# Patient Record
Sex: Female | Born: 1950
Health system: Southern US, Community
[De-identification: ages and names within clinical notes are randomized; demographics above are authoritative.]

## PROBLEM LIST (undated history)

## (undated) DIAGNOSIS — M069 Rheumatoid arthritis, unspecified: Secondary | ICD-10-CM

## (undated) DIAGNOSIS — L900 Lichen sclerosus et atrophicus: Secondary | ICD-10-CM

## (undated) DIAGNOSIS — IMO0002 Reserved for concepts with insufficient information to code with codable children: Secondary | ICD-10-CM

## (undated) DIAGNOSIS — R896 Abnormal cytological findings in specimens from other organs, systems and tissues: Secondary | ICD-10-CM

## (undated) DIAGNOSIS — J189 Pneumonia, unspecified organism: Secondary | ICD-10-CM

## (undated) HISTORY — DX: Abnormal cytological findings in specimens from other organs, systems and tissues: R89.6

## (undated) HISTORY — DX: Rheumatoid arthritis, unspecified: M06.9

## (undated) HISTORY — DX: Reserved for concepts with insufficient information to code with codable children: IMO0002

## (undated) HISTORY — DX: Pneumonia, unspecified organism: J18.9

## (undated) HISTORY — DX: Lichen sclerosus et atrophicus: L90.0

---

## 1995-10-19 HISTORY — PX: TUBAL LIGATION: SHX77

## 1998-06-11 ENCOUNTER — Ambulatory Visit (HOSPITAL_COMMUNITY): Admission: RE | Admit: 1998-06-11 | Discharge: 1998-06-11 | Payer: Self-pay | Admitting: *Deleted

## 1999-06-16 ENCOUNTER — Encounter: Payer: Self-pay | Admitting: Family Medicine

## 1999-06-16 ENCOUNTER — Ambulatory Visit (HOSPITAL_COMMUNITY): Admission: RE | Admit: 1999-06-16 | Discharge: 1999-06-16 | Payer: Self-pay | Admitting: Family Medicine

## 2001-03-21 ENCOUNTER — Encounter: Payer: Self-pay | Admitting: *Deleted

## 2001-03-21 ENCOUNTER — Other Ambulatory Visit: Admission: RE | Admit: 2001-03-21 | Discharge: 2001-03-21 | Payer: Self-pay | Admitting: *Deleted

## 2001-03-21 ENCOUNTER — Ambulatory Visit (HOSPITAL_COMMUNITY): Admission: RE | Admit: 2001-03-21 | Discharge: 2001-03-21 | Payer: Self-pay | Admitting: *Deleted

## 2001-04-04 ENCOUNTER — Other Ambulatory Visit: Admission: RE | Admit: 2001-04-04 | Discharge: 2001-04-04 | Payer: Self-pay | Admitting: *Deleted

## 2001-04-04 ENCOUNTER — Encounter (INDEPENDENT_AMBULATORY_CARE_PROVIDER_SITE_OTHER): Payer: Self-pay | Admitting: Specialist

## 2002-02-22 ENCOUNTER — Emergency Department (HOSPITAL_COMMUNITY): Admission: EM | Admit: 2002-02-22 | Discharge: 2002-02-22 | Payer: Self-pay | Admitting: Emergency Medicine

## 2004-02-27 ENCOUNTER — Other Ambulatory Visit: Admission: RE | Admit: 2004-02-27 | Discharge: 2004-02-27 | Payer: Self-pay | Admitting: Obstetrics and Gynecology

## 2005-07-25 ENCOUNTER — Emergency Department (HOSPITAL_COMMUNITY): Admission: EM | Admit: 2005-07-25 | Discharge: 2005-07-25 | Payer: Self-pay | Admitting: Family Medicine

## 2005-10-15 ENCOUNTER — Ambulatory Visit (HOSPITAL_COMMUNITY): Admission: RE | Admit: 2005-10-15 | Discharge: 2005-10-15 | Payer: Self-pay | Admitting: Obstetrics and Gynecology

## 2005-10-30 ENCOUNTER — Emergency Department (HOSPITAL_COMMUNITY): Admission: EM | Admit: 2005-10-30 | Discharge: 2005-10-30 | Payer: Self-pay | Admitting: Emergency Medicine

## 2006-11-24 ENCOUNTER — Ambulatory Visit (HOSPITAL_COMMUNITY): Admission: RE | Admit: 2006-11-24 | Discharge: 2006-11-24 | Payer: Self-pay | Admitting: Obstetrics and Gynecology

## 2007-06-29 ENCOUNTER — Ambulatory Visit: Payer: Self-pay | Admitting: Internal Medicine

## 2007-06-29 DIAGNOSIS — J309 Allergic rhinitis, unspecified: Secondary | ICD-10-CM | POA: Insufficient documentation

## 2007-06-29 DIAGNOSIS — Z860100 Personal history of colon polyps, unspecified: Secondary | ICD-10-CM | POA: Insufficient documentation

## 2007-07-12 ENCOUNTER — Telehealth: Payer: Self-pay | Admitting: Internal Medicine

## 2008-01-31 ENCOUNTER — Ambulatory Visit: Payer: Self-pay | Admitting: Family Medicine

## 2008-01-31 DIAGNOSIS — F411 Generalized anxiety disorder: Secondary | ICD-10-CM | POA: Insufficient documentation

## 2008-01-31 DIAGNOSIS — L255 Unspecified contact dermatitis due to plants, except food: Secondary | ICD-10-CM | POA: Insufficient documentation

## 2008-08-09 ENCOUNTER — Telehealth: Payer: Self-pay | Admitting: *Deleted

## 2008-12-16 DIAGNOSIS — IMO0001 Reserved for inherently not codable concepts without codable children: Secondary | ICD-10-CM

## 2008-12-16 HISTORY — DX: Reserved for inherently not codable concepts without codable children: IMO0001

## 2010-10-18 DIAGNOSIS — L900 Lichen sclerosus et atrophicus: Secondary | ICD-10-CM

## 2010-10-18 DIAGNOSIS — M069 Rheumatoid arthritis, unspecified: Secondary | ICD-10-CM

## 2010-10-18 HISTORY — DX: Rheumatoid arthritis, unspecified: M06.9

## 2010-10-18 HISTORY — DX: Lichen sclerosus et atrophicus: L90.0

## 2010-11-07 ENCOUNTER — Encounter: Payer: Self-pay | Admitting: Neurology

## 2010-11-08 ENCOUNTER — Encounter: Payer: Self-pay | Admitting: Obstetrics and Gynecology

## 2010-11-09 ENCOUNTER — Encounter: Payer: Self-pay | Admitting: Gastroenterology

## 2011-02-24 ENCOUNTER — Other Ambulatory Visit (HOSPITAL_COMMUNITY)
Admission: RE | Admit: 2011-02-24 | Discharge: 2011-02-24 | Disposition: A | Discharge status: Home / Self Care | Payer: Self-pay | Source: Ambulatory Visit | Attending: Gynecology | Admitting: Gynecology

## 2011-02-24 ENCOUNTER — Ambulatory Visit (INDEPENDENT_AMBULATORY_CARE_PROVIDER_SITE_OTHER): Payer: Self-pay | Admitting: Gynecology

## 2011-02-24 ENCOUNTER — Other Ambulatory Visit: Payer: Self-pay | Admitting: Gynecology

## 2011-02-24 DIAGNOSIS — Z124 Encounter for screening for malignant neoplasm of cervix: Secondary | ICD-10-CM | POA: Insufficient documentation

## 2011-02-24 DIAGNOSIS — Z833 Family history of diabetes mellitus: Secondary | ICD-10-CM

## 2011-02-24 DIAGNOSIS — Z13220 Encounter for screening for lipoid disorders: Secondary | ICD-10-CM

## 2011-02-24 DIAGNOSIS — Z01419 Encounter for gynecological examination (general) (routine) without abnormal findings: Secondary | ICD-10-CM

## 2011-07-15 ENCOUNTER — Other Ambulatory Visit: Payer: Self-pay | Admitting: *Deleted

## 2011-07-15 DIAGNOSIS — E7800 Pure hypercholesterolemia, unspecified: Secondary | ICD-10-CM

## 2011-07-16 ENCOUNTER — Other Ambulatory Visit (INDEPENDENT_AMBULATORY_CARE_PROVIDER_SITE_OTHER): Payer: Self-pay

## 2011-07-16 DIAGNOSIS — E7800 Pure hypercholesterolemia, unspecified: Secondary | ICD-10-CM

## 2011-08-05 ENCOUNTER — Encounter: Payer: Self-pay | Admitting: Gynecology

## 2011-08-19 ENCOUNTER — Ambulatory Visit: Payer: Self-pay | Admitting: Gynecology

## 2011-08-31 ENCOUNTER — Other Ambulatory Visit (HOSPITAL_COMMUNITY)
Admission: RE | Admit: 2011-08-31 | Discharge: 2011-08-31 | Disposition: A | Discharge status: Home / Self Care | Payer: Self-pay | Source: Ambulatory Visit | Attending: Gynecology | Admitting: Gynecology

## 2011-08-31 ENCOUNTER — Ambulatory Visit (INDEPENDENT_AMBULATORY_CARE_PROVIDER_SITE_OTHER): Payer: Self-pay | Admitting: Gynecology

## 2011-08-31 ENCOUNTER — Encounter: Payer: Self-pay | Admitting: Gynecology

## 2011-08-31 VITALS — BP 110/70

## 2011-08-31 DIAGNOSIS — N898 Other specified noninflammatory disorders of vagina: Secondary | ICD-10-CM

## 2011-08-31 DIAGNOSIS — Z124 Encounter for screening for malignant neoplasm of cervix: Secondary | ICD-10-CM

## 2011-08-31 DIAGNOSIS — Z01419 Encounter for gynecological examination (general) (routine) without abnormal findings: Secondary | ICD-10-CM | POA: Insufficient documentation

## 2011-08-31 DIAGNOSIS — L293 Anogenital pruritus, unspecified: Secondary | ICD-10-CM

## 2011-08-31 DIAGNOSIS — R87619 Unspecified abnormal cytological findings in specimens from cervix uteri: Secondary | ICD-10-CM

## 2011-08-31 DIAGNOSIS — B373 Candidiasis of vulva and vagina: Secondary | ICD-10-CM

## 2011-08-31 MED ORDER — FLUCONAZOLE 150 MG PO TABS: 150.0000 mg | ORAL_TABLET | Freq: Once | ORAL | Status: AC

## 2011-08-31 NOTE — Progress Notes (Signed)
Patient presents for follow up Pap smear as well as complaining of some vaginal itching and dryness.  Exam Pelvic external BUS vagina with white discharge KOH wet prep done. Cervix normal Pap done. Uterus normal size midline mobile nontender. Adnexa without masses or tenderness.  Assessment and plan: 1. White discharge KOH wet prep is positive for yeast. We'll treat with Diflucan 150x1 dose follow up if symptoms persist or recur. 2. History of low-grade SIL 2005 and 2006. ASCUS negative high-risk HPV 2010. Last Pap smear May 2012 is normal. Assuming this Pap smear is normal then we'll go to annual cytology and then at some point less frequent screening but we'll discuss that at her annual visit. 3. Rheumatoid arthritis. Patient recently diagnosed with rheumatoid arthritis was treated with a course of steroids which probably contributed to her yeast infection.

## 2011-09-03 ENCOUNTER — Telehealth: Payer: Self-pay | Admitting: *Deleted

## 2011-09-03 MED ORDER — TERCONAZOLE 0.8 % VA CREA: 1.0000 | TOPICAL_CREAM | Freq: Every day | VAGINAL | Status: AC

## 2011-09-03 NOTE — Telephone Encounter (Signed)
Pt informed with the below note. 

## 2011-09-03 NOTE — Telephone Encounter (Signed)
Pt called stating that itching from yeast infection still there. Pt was seen on 08/31/11 and was given diflucan 150 mg 1 tab to take. She took medication and no relief. Please advise.

## 2011-09-03 NOTE — Telephone Encounter (Signed)
Terazol 3 day cream 

## 2011-09-06 ENCOUNTER — Telehealth: Payer: Self-pay | Admitting: *Deleted

## 2011-09-06 MED ORDER — FLUCONAZOLE 200 MG PO TABS: 200.0000 mg | ORAL_TABLET | Freq: Every day | ORAL | Status: AC

## 2011-09-06 NOTE — Telephone Encounter (Signed)
Pt called today c/o itching still from last office visit 08/31/11. Pt has had diflucan 150 mg and Terazol 3 day cream and still has itching. Pt thinks the cream has made the itching worse. Office visit? Please advise

## 2011-09-06 NOTE — Telephone Encounter (Signed)
Recommend Diflucan 200 daily x5 days

## 2011-09-06 NOTE — Telephone Encounter (Signed)
Pt informed with the below note. 

## 2011-09-06 NOTE — Telephone Encounter (Signed)
Addended by: Dara Lords on: 09/06/2011 12:59 PM   Modules accepted: Orders

## 2011-09-15 ENCOUNTER — Encounter: Payer: Self-pay | Admitting: Gynecology

## 2011-09-15 ENCOUNTER — Ambulatory Visit (INDEPENDENT_AMBULATORY_CARE_PROVIDER_SITE_OTHER): Payer: Self-pay | Admitting: Gynecology

## 2011-09-15 DIAGNOSIS — N898 Other specified noninflammatory disorders of vagina: Secondary | ICD-10-CM

## 2011-09-15 DIAGNOSIS — N9089 Other specified noninflammatory disorders of vulva and perineum: Secondary | ICD-10-CM

## 2011-09-15 DIAGNOSIS — L293 Anogenital pruritus, unspecified: Secondary | ICD-10-CM

## 2011-09-15 DIAGNOSIS — B373 Candidiasis of vulva and vagina: Secondary | ICD-10-CM

## 2011-09-15 MED ORDER — FLUCONAZOLE 200 MG PO TABS: ORAL_TABLET | ORAL | Status: DC

## 2011-09-15 NOTE — Patient Instructions (Signed)
Follow up for biopsy results. Take Diflucan as recommended.

## 2011-09-15 NOTE — Progress Notes (Signed)
Patient presents complaining of persistent vaginal pruritus and discharge. When I saw her previously she had yeast and I prescribed Diflucan 150x1 dose. She called stating that it did not help and I gave her Terazol 3 cream. This again did not help and in fact made her symptoms worse with more burning and we called in Diflucan 200 daily x5 days. She said that that seemed to help but it did not completely eradicate her symptoms.  Exam External with some whitish hyperkeratotic changes bilaterally lateral to the posterior fourchette in the folds of her lower labia. Overall atrophic changes noted vagina atrophic changes slight white discharge cervix atrophic bimanual uterus normal size midline mobile nontender adnexa without masses or tenderness  Assessment and plan: 1. White discharge KOH wet prep is positive for yeast. She does have some hyperkeratotic type changes externally lateral to the posterior fourchette. Question lichen sclerosis versus chronic inflammation due to recurrent yeast. Discussed biopsy with the patient and she agrees subtotally area on the left side lateral and inferior to the posterior fourchette was cleansed with Betadine infiltrated with 1% lidocaine and represent a skin biopsy was taken. Silver nitrate hemostasis applied. Patient will follow for results. We'll cover her yeast with Diflucan 200 twice a day x3 days then daily x5 days. I discussed possible Temovate cream if lichen sclerosis. I also reviewed the issues of atrophic changes. She is having some longer term dryness and whether estrogen treatment would be warranted. I reviewed the whole issue of HRT to include the WHI study increased risk of stroke heart attack DVT as well as increased risk of breast cancer. Oral versus topical with absorption issues discussed and the use of Vagifem with limited absorption also reviewed. Patient to follow for biopsy results we'll see how she's doing on the Diflucan and then we'll go from there.

## 2011-09-21 MED ORDER — CLOBETASOL PROPIONATE 0.05 % EX CREA: TOPICAL_CREAM | CUTANEOUS | Status: DC

## 2011-09-21 NOTE — Progress Notes (Signed)
Addended by: Dara Lords on: 09/21/2011 08:47 AM   Modules accepted: Orders

## 2012-08-07 ENCOUNTER — Encounter: Payer: Self-pay | Admitting: Gynecology

## 2012-09-08 ENCOUNTER — Encounter: Payer: Self-pay | Admitting: Gynecology

## 2012-09-08 ENCOUNTER — Other Ambulatory Visit: Payer: Self-pay | Admitting: Gynecology

## 2012-09-08 NOTE — Telephone Encounter (Signed)
Pt called requesting refill on Rx. No vaginal itching nor discharge.

## 2013-08-09 ENCOUNTER — Encounter: Payer: Self-pay | Admitting: Gynecology

## 2014-08-14 ENCOUNTER — Ambulatory Visit: Payer: Self-pay | Admitting: Women's Health

## 2014-08-15 ENCOUNTER — Encounter: Payer: Self-pay | Admitting: Women's Health

## 2014-08-15 ENCOUNTER — Ambulatory Visit (INDEPENDENT_AMBULATORY_CARE_PROVIDER_SITE_OTHER): Payer: Self-pay | Admitting: Women's Health

## 2014-08-15 VITALS — BP 122/80 | Ht 65.0 in | Wt 135.0 lb

## 2014-08-15 DIAGNOSIS — R350 Frequency of micturition: Secondary | ICD-10-CM

## 2014-08-15 DIAGNOSIS — N3000 Acute cystitis without hematuria: Secondary | ICD-10-CM

## 2014-08-15 LAB — URINALYSIS W MICROSCOPIC + REFLEX CULTURE
Bilirubin Urine: NEGATIVE
Casts: NONE SEEN
Crystals: NONE SEEN
Glucose, UA: NEGATIVE mg/dL
Ketones, ur: NEGATIVE mg/dL
Nitrite: POSITIVE — AB
Specific Gravity, Urine: 1.02 (ref 1.005–1.030)
Urobilinogen, UA: 0.2 mg/dL (ref 0.0–1.0)
pH: 5.5 (ref 5.0–8.0)

## 2014-08-15 MED ORDER — FLUCONAZOLE 150 MG PO TABS: 150.0000 mg | ORAL_TABLET | Freq: Once | ORAL | Status: DC

## 2014-08-15 MED ORDER — CIPROFLOXACIN HCL 500 MG PO TABS: 500.0000 mg | ORAL_TABLET | Freq: Two times a day (BID) | ORAL | Status: DC

## 2014-08-15 NOTE — Patient Instructions (Signed)

## 2014-08-15 NOTE — Progress Notes (Signed)
Presents with 2 day history of intermittent urinary frequency, lower abdominal pressure, and "tingling" with urination.  Denies vaginal discharge and odor, fever, or abdominal pain.  Leaving in two days for two week trip to [place].   Exam: Appears well. External genitalia WLN. Vaginal wall and cervix visualized; atrophy noted with no discharge or erythema.  No CMT or adnexal fullness. UA: Positive for Nitrites, moderate leukocytes, TNTC WBC, Many bacteria.   UTI  Plan: Cipro 500mg  PO BID X 5 days. Instructed to take for 3 days, and if sxs not cleared, complete remaining two days. Diflucan 150mg  PO once for yeast if needed with refill.  UTI prevention reviewed. Urine culture pending. Schedule annual exam.

## 2014-08-18 LAB — URINE CULTURE: Colony Count: >=100000

## 2014-08-19 ENCOUNTER — Encounter: Payer: Self-pay | Admitting: Women's Health

## 2014-09-11 ENCOUNTER — Ambulatory Visit (INDEPENDENT_AMBULATORY_CARE_PROVIDER_SITE_OTHER): Payer: Self-pay | Admitting: Gynecology

## 2014-09-11 ENCOUNTER — Other Ambulatory Visit (HOSPITAL_COMMUNITY)
Admission: RE | Admit: 2014-09-11 | Discharge: 2014-09-11 | Disposition: A | Discharge status: Home / Self Care | Payer: Self-pay | Source: Ambulatory Visit | Attending: Gynecology | Admitting: Gynecology

## 2014-09-11 ENCOUNTER — Encounter: Payer: Self-pay | Admitting: Gynecology

## 2014-09-11 VITALS — BP 120/60 | Ht 64.0 in | Wt 136.0 lb

## 2014-09-11 DIAGNOSIS — Z01419 Encounter for gynecological examination (general) (routine) without abnormal findings: Secondary | ICD-10-CM | POA: Insufficient documentation

## 2014-09-11 DIAGNOSIS — Z1151 Encounter for screening for human papillomavirus (HPV): Secondary | ICD-10-CM | POA: Insufficient documentation

## 2014-09-11 DIAGNOSIS — N952 Postmenopausal atrophic vaginitis: Secondary | ICD-10-CM

## 2014-09-11 DIAGNOSIS — L900 Lichen sclerosus et atrophicus: Secondary | ICD-10-CM

## 2014-09-11 NOTE — Addendum Note (Signed)
Addended by: Dayna Barker on: 09/11/2014 02:47 PM   Modules accepted: Orders, SmartSet

## 2014-09-11 NOTE — Patient Instructions (Signed)
You may obtain a copy of any labs that were done today by logging onto MyChart as outlined in the instructions provided with your AVS (after visit summary). The office will not call with normal lab results but certainly if there are any significant abnormalities then we will contact you.   Health Maintenance, Female A healthy lifestyle and preventative care can promote health and wellness.  Maintain regular health, dental, and eye exams.  Eat a healthy diet. Foods like vegetables, fruits, whole grains, low-fat dairy products, and lean protein foods contain the nutrients you need without too many calories. Decrease your intake of foods high in solid fats, added sugars, and salt. Get information about a proper diet from your caregiver, if necessary.  Regular physical exercise is one of the most important things you can do for your health. Most adults should get at least 150 minutes of moderate-intensity exercise (any activity that increases your heart rate and causes you to sweat) each week. In addition, most adults need muscle-strengthening exercises on 2 or more days a week.   Maintain a healthy weight. The body mass index (BMI) is a screening tool to identify possible weight problems. It provides an estimate of body fat based on height and weight. Your caregiver can help determine your BMI, and can help you achieve or maintain a healthy weight. For adults 20 years and older:  A BMI below 18.5 is considered underweight.  A BMI of 18.5 to 24.9 is normal.  A BMI of 25 to 29.9 is considered overweight.  A BMI of 30 and above is considered obese.  Maintain normal blood lipids and cholesterol by exercising and minimizing your intake of saturated fat. Eat a balanced diet with plenty of fruits and vegetables. Blood tests for lipids and cholesterol should begin at age 61 and be repeated every 5 years. If your lipid or cholesterol levels are high, you are over 50, or you are a high risk for heart  disease, you may need your cholesterol levels checked more frequently.Ongoing high lipid and cholesterol levels should be treated with medicines if diet and exercise are not effective.  If you smoke, find out from your caregiver how to quit. If you do not use tobacco, do not start.  Lung cancer screening is recommended for adults aged 33 80 years who are at high risk for developing lung cancer because of a history of smoking. Yearly low-dose computed tomography (CT) is recommended for people who have at least a 30-pack-year history of smoking and are a current smoker or have quit within the past 15 years. A pack year of smoking is smoking an average of 1 pack of cigarettes a day for 1 year (for example: 1 pack a day for 30 years or 2 packs a day for 15 years). Yearly screening should continue until the smoker has stopped smoking for at least 15 years. Yearly screening should also be stopped for people who develop a health problem that would prevent them from having lung cancer treatment.  If you are pregnant, do not drink alcohol. If you are breastfeeding, be very cautious about drinking alcohol. If you are not pregnant and choose to drink alcohol, do not exceed 1 drink per day. One drink is considered to be 12 ounces (355 mL) of beer, 5 ounces (148 mL) of wine, or 1.5 ounces (44 mL) of liquor.  Avoid use of street drugs. Do not share needles with anyone. Ask for help if you need support or instructions about stopping  the use of drugs.  High blood pressure causes heart disease and increases the risk of stroke. Blood pressure should be checked at least every 1 to 2 years. Ongoing high blood pressure should be treated with medicines, if weight loss and exercise are not effective.  If you are 59 to 62 years old, ask your caregiver if you should take aspirin to prevent strokes.  Diabetes screening involves taking a blood sample to check your fasting blood sugar level. This should be done once every 3  years, after age 91, if you are within normal weight and without risk factors for diabetes. Testing should be considered at a younger age or be carried out more frequently if you are overweight and have at least 1 risk factor for diabetes.  Breast cancer screening is essential preventative care for women. You should practice "breast self-awareness." This means understanding the normal appearance and feel of your breasts and may include breast self-examination. Any changes detected, no matter how small, should be reported to a caregiver. Women in their 66s and 30s should have a clinical breast exam (CBE) by a caregiver as part of a regular health exam every 1 to 3 years. After age 101, women should have a CBE every year. Starting at age 100, women should consider having a mammogram (breast X-ray) every year. Women who have a family history of breast cancer should talk to their caregiver about genetic screening. Women at a high risk of breast cancer should talk to their caregiver about having an MRI and a mammogram every year.  Breast cancer gene (BRCA)-related cancer risk assessment is recommended for women who have family members with BRCA-related cancers. BRCA-related cancers include breast, ovarian, tubal, and peritoneal cancers. Having family members with these cancers may be associated with an increased risk for harmful changes (mutations) in the breast cancer genes BRCA1 and BRCA2. Results of the assessment will determine the need for genetic counseling and BRCA1 and BRCA2 testing.  The Pap test is a screening test for cervical cancer. Women should have a Pap test starting at age 57. Between ages 25 and 35, Pap tests should be repeated every 2 years. Beginning at age 37, you should have a Pap test every 3 years as long as the past 3 Pap tests have been normal. If you had a hysterectomy for a problem that was not cancer or a condition that could lead to cancer, then you no longer need Pap tests. If you are  between ages 50 and 76, and you have had normal Pap tests going back 10 years, you no longer need Pap tests. If you have had past treatment for cervical cancer or a condition that could lead to cancer, you need Pap tests and screening for cancer for at least 20 years after your treatment. If Pap tests have been discontinued, risk factors (such as a new sexual partner) need to be reassessed to determine if screening should be resumed. Some women have medical problems that increase the chance of getting cervical cancer. In these cases, your caregiver may recommend more frequent screening and Pap tests.  The human papillomavirus (HPV) test is an additional test that may be used for cervical cancer screening. The HPV test looks for the virus that can cause the cell changes on the cervix. The cells collected during the Pap test can be tested for HPV. The HPV test could be used to screen women aged 44 years and older, and should be used in women of any age  who have unclear Pap test results. After the age of 55, women should have HPV testing at the same frequency as a Pap test.  Colorectal cancer can be detected and often prevented. Most routine colorectal cancer screening begins at the age of 44 and continues through age 20. However, your caregiver may recommend screening at an earlier age if you have risk factors for colon cancer. On a yearly basis, your caregiver may provide home test kits to check for hidden blood in the stool. Use of a small camera at the end of a tube, to directly examine the colon (sigmoidoscopy or colonoscopy), can detect the earliest forms of colorectal cancer. Talk to your caregiver about this at age 86, when routine screening begins. Direct examination of the colon should be repeated every 5 to 10 years through age 13, unless early forms of pre-cancerous polyps or small growths are found.  Hepatitis C blood testing is recommended for all people born from [birth year] through 1964 and any  individual with known risks for hepatitis C.  Practice safe sex. Use condoms and avoid high-risk sexual practices to reduce the spread of sexually transmitted infections (STIs). Sexually active women aged 36 and younger should be checked for Chlamydia, which is a common sexually transmitted infection. Older women with new or multiple partners should also be tested for Chlamydia. Testing for other STIs is recommended if you are sexually active and at increased risk.  Osteoporosis is a disease in which the bones lose minerals and strength with aging. This can result in serious bone fractures. The risk of osteoporosis can be identified using a bone density scan. Women ages 20 and over and women at risk for fractures or osteoporosis should discuss screening with their caregivers. Ask your caregiver whether you should be taking a calcium supplement or vitamin D to reduce the rate of osteoporosis.  Menopause can be associated with physical symptoms and risks. Hormone replacement therapy is available to decrease symptoms and risks. You should talk to your caregiver about whether hormone replacement therapy is right for you.  Use sunscreen. Apply sunscreen liberally and repeatedly throughout the day. You should seek shade when your shadow is shorter than you. Protect yourself by wearing long sleeves, pants, a wide-brimmed hat, and sunglasses year round, whenever you are outdoors.  Notify your caregiver of new moles or changes in moles, especially if there is a change in shape or color. Also notify your caregiver if a mole is larger than the size of a pencil eraser.  Stay current with your immunizations. Document Released: 04/19/2011 Document Revised: 01/29/2013 Document Reviewed: 04/19/2011 [facility] Patient Information 2014 [employer].

## 2014-09-11 NOTE — Progress Notes (Signed)
Heather Koch January 29, 1951 [id number]        63 y.o.  G3P3 for annual exam.  Has not been in for several years. Several issues noted below.  Past medical history,surgical history, problem list, medications, allergies, family history and social history were all reviewed and documented as reviewed in the EPIC chart.  ROS:  12 system ROS performed with pertinent positives and negatives included in the history, assessment and plan.   Additional significant findings :  none   Exam: [facility] person Vitals:   09/11/14 1424  BP: 120/60  Height: 5\' 4"  (1.626 m)  Weight: 136 lb (61.689 kg)   General appearance:  Normal affect, orientation and appearance. Skin: Grossly normal HEENT: Without gross lesions.  No cervical or supraclavicular adenopathy. Thyroid normal.  Lungs:  Clear without wheezing, rales or rhonchi Cardiac: RR, without RMG Abdominal:  Soft, nontender, without masses, guarding, rebound, organomegaly or hernia Breasts:  Examined lying and sitting without masses, retractions, discharge or axillary adenopathy. Pelvic:  Ext/BUS/vagina with generalized atrophic changes  Cervix with atrophic changes. Pap/HPV  Uterus anteverted, normal size, shape and contour, midline and mobile nontender   Adnexa  Without masses or tenderness    Anus and perineum  Normal   Rectovaginal  Normal sphincter tone without palpated masses or tenderness.    Assessment/Plan:  63 y.o. G3P3 female for annual exam.   1. Postmenopausal/atrophic genital changes. Patient without significant symptoms of hot flashes, night sweats, vaginal dryness or any vaginal bleeding. Continue to monitor. Report any vaginal bleeding. 2. History of lichen sclerosis. Exam shows atrophic changes but no classic white blanched out appearance. No lesions noted on vulvar exam. No symptoms such as itching or irritation.  Continue to observe. 3. Pap smear 2012. Pap/HPV today. Patient does have history of LGSIL in 2005/2006. ASCUS with  negative high-risk HPV 2010. Pap smear 2012 normal. Triage based upon this Pap smears result. 4. Mammography 07/2014. Continue with annual mammography. SBE monthly reviewed. 5. Colonoscopy 6 years ago. Repeat at their recommended interval. 6. DEXA reported 2012 by rheumatologist following her for rheumatoid arthritis. Patient was told it was normal. Recommend repeat at age 33 unless recommended sooner by the rheumatologist. Increased calcium vitamin D. 7. Health maintenance. No lab work done as she reports this done through her primary physician's office.     76 MD, 2:41 PM 09/11/2014

## 2014-09-16 ENCOUNTER — Encounter: Payer: Self-pay | Admitting: Gynecology

## 2014-09-17 LAB — CYTOLOGY - PAP

## 2014-09-22 ENCOUNTER — Other Ambulatory Visit: Payer: Self-pay

## 2014-09-22 ENCOUNTER — Encounter (HOSPITAL_COMMUNITY): Payer: Self-pay | Admitting: *Deleted

## 2014-09-22 ENCOUNTER — Emergency Department (HOSPITAL_COMMUNITY)
Admission: EM | Admit: 2014-09-22 | Discharge: 2014-09-22 | Disposition: A | Discharge status: Home / Self Care | Payer: Self-pay | Attending: Emergency Medicine | Admitting: Emergency Medicine

## 2014-09-22 DIAGNOSIS — M069 Rheumatoid arthritis, unspecified: Secondary | ICD-10-CM | POA: Insufficient documentation

## 2014-09-22 DIAGNOSIS — Z79899 Other long term (current) drug therapy: Secondary | ICD-10-CM | POA: Insufficient documentation

## 2014-09-22 DIAGNOSIS — R1013 Epigastric pain: Secondary | ICD-10-CM

## 2014-09-22 DIAGNOSIS — R112 Nausea with vomiting, unspecified: Secondary | ICD-10-CM

## 2014-09-22 LAB — CBC WITH DIFFERENTIAL/PLATELET
Basophils Absolute: 0 10*3/uL (ref 0.0–0.1)
Basophils Relative: 0 % (ref 0–1)
Eosinophils Absolute: 0 10*3/uL (ref 0.0–0.7)
Eosinophils Relative: 0 % (ref 0–5)
HCT: 37.3 % (ref 36.0–46.0)
Hemoglobin: 12.2 g/dL (ref 12.0–15.0)
Lymphocytes Relative: 5 % — ABNORMAL LOW (ref 12–46)
Lymphs Abs: 0.7 10*3/uL (ref 0.7–4.0)
MCH: 32.4 pg (ref 26.0–34.0)
MCHC: 32.7 g/dL (ref 30.0–36.0)
MCV: 99.2 fL (ref 78.0–100.0)
Monocytes Absolute: 0.8 10*3/uL (ref 0.1–1.0)
Monocytes Relative: 6 % (ref 3–12)
Neutro Abs: 11.9 10*3/uL — ABNORMAL HIGH (ref 1.7–7.7)
Neutrophils Relative %: 89 % — ABNORMAL HIGH (ref 43–77)
Platelets: 199 10*3/uL (ref 150–400)
RBC: 3.76 MIL/uL — ABNORMAL LOW (ref 3.87–5.11)
RDW: 13.9 % (ref 11.5–15.5)
WBC: 13.4 10*3/uL — ABNORMAL HIGH (ref 4.0–10.5)

## 2014-09-22 LAB — COMPREHENSIVE METABOLIC PANEL
ALT: 16 U/L (ref 0–35)
AST: 23 U/L (ref 0–37)
Albumin: 4 g/dL (ref 3.5–5.2)
Alkaline Phosphatase: 75 U/L (ref 39–117)
Anion gap: 14 (ref 5–15)
BUN: 15 mg/dL (ref 6–23)
CO2: 25 mEq/L (ref 19–32)
Calcium: 9.7 mg/dL (ref 8.4–10.5)
Chloride: 102 mEq/L (ref 96–112)
Creatinine, Ser: 0.56 mg/dL (ref 0.50–1.10)
GFR calc Af Amer: >90 mL/min (ref >90)
GFR calc non Af Amer: >90 mL/min (ref >90)
Glucose, Bld: 137 mg/dL — ABNORMAL HIGH (ref 70–99)
Potassium: 3.8 mEq/L (ref 3.7–5.3)
Sodium: 141 mEq/L (ref 137–147)
Total Bilirubin: 0.3 mg/dL (ref 0.3–1.2)
Total Protein: 7.4 g/dL (ref 6.0–8.3)

## 2014-09-22 LAB — I-STAT CG4 LACTIC ACID, ED: Lactic Acid, Venous: 1.08 mmol/L (ref 0.5–2.2)

## 2014-09-22 LAB — I-STAT TROPONIN, ED: Troponin i, poc: 0 ng/mL (ref 0.00–0.08)

## 2014-09-22 LAB — LIPASE, BLOOD: Lipase: 51 U/L (ref 11–59)

## 2014-09-22 MED ORDER — ONDANSETRON HCL 4 MG/2ML IJ SOLN
4.0000 mg | Freq: Once | INTRAMUSCULAR | Status: AC
  Administered 2014-09-22: 4 mg via INTRAVENOUS
  Filled 2014-09-22: qty 2

## 2014-09-22 MED ORDER — ONDANSETRON 8 MG PO TBDP: 8.0000 mg | ORAL_TABLET | Freq: Three times a day (TID) | ORAL | Status: DC | PRN

## 2014-09-22 NOTE — ED Notes (Signed)
The patient is unable to give an urine specimen at this time. The tech has reported to the RN in charge. 

## 2014-09-22 NOTE — ED Provider Notes (Signed)
CSN: [id number]     Arrival date & time 09/22/14  0737 History   First MD Initiated Contact with Patient 09/22/14 [phone]     Chief Complaint  Patient presents with  . Emesis  . Nausea  . Abdominal Pain     Patient is a 63 y.o. female presenting with vomiting and abdominal pain. The history is provided by the patient and a significant other.  Emesis Severity:  Moderate Timing:  Intermittent Progression:  Worsening Chronicity:  New Relieved by:  None tried Worsened by:  Nothing tried Associated symptoms: abdominal pain   Associated symptoms: no diarrhea   Risk factors: suspect food intake   Risk factors: no sick contacts and no travel to endemic areas   Abdominal Pain Pain location:  Epigastric Pain quality: aching   Pain radiates to:  Does not radiate Pain severity:  Mild Onset quality:  Sudden Timing:  Constant Associated symptoms: vomiting   Associated symptoms: no chest pain, no diarrhea, no fever and no shortness of breath   Patient reports she woke up with significant nausea and has had nonbloody vomiting No diarrhea She reports some epigastric abd pain No CP/SOB She has never had this before She had otherwise been well.  She reports she ate pizza for dinner and thinks this may have caused her symptoms.  Past Medical History  Diagnosis Date  . Rheumatoid arthritis(714.0) 2012  . LGSIL (low grade squamous intraepithelial dysplasia) 2005/2006  . ASCUS (atypical squamous cells of undetermined significance) on Pap smear 12/2008    neg HR HPV  . Lichen sclerosus 2012   Past Surgical History  Procedure Laterality Date  . Tubal ligation  1997   Family History  Problem Relation Age of Onset  . Hypertension Mother   . Diabetes Mother   . Cancer Father     liver  . Heart disease Sister   . Heart disease Brother   . Cancer Brother     pancreatic cancer  . Cancer Maternal Aunt     throat  . Heart disease Sister   . Heart disease Sister   . Cancer Maternal Uncle      Colon   History  Substance Use Topics  . Smoking status: Never Smoker   . Smokeless tobacco: Never Used  . Alcohol Use: No   OB History    Gravida Para Term Preterm AB TAB SAB Ectopic Multiple Living   3 3        3      Review of Systems  Constitutional: Negative for fever.  Respiratory: Negative for shortness of breath.   Cardiovascular: Negative for chest pain.  Gastrointestinal: Positive for vomiting and abdominal pain. Negative for diarrhea.  All other systems reviewed and are negative.     Allergies  Review of patient's allergies indicates no known allergies.  Home Medications   Prior to Admission medications   Medication Sig Start Date End Date Taking? Authorizing Provider  ALPRAZolam ) 1 MG tablet Take 1 mg by mouth at bedtime as needed.      Historical Provider, MD  clobetasol cream (TEMOVATE) 0.05 % APPLY TOPICALLY AT BEDTIME AS NEEDED FOR ITCHING 09/08/12   09/10/12, MD  FOLIC ACID PO Take 1 tablet by mouth daily.    Historical Provider, MD  Methotrexate Sodium, PF, 250 MG/10ML SOLN Inject as directed once a week.    Historical Provider, MD  sulfaDIAZINE 500 MG tablet Take 1,000 mg by mouth 2 (two) times daily.  Historical Provider, MD   BP 122/61 mmHg  Pulse 88  Temp(Src) 97.4 F (36.3 C) (Oral)  Resp 20  Ht 5\' 4"  (1.626 m)  Wt 130 lb (58.968 kg)  BMI 22.30 kg/m2  SpO2 100%  LMP 03/31/2003 Physical Exam CONSTITUTIONAL: Well developed/well nourished HEAD: Normocephalic/atraumatic EYES: EOMI/PERRL, no icterus ENMT: Mucous membranes moist NECK: supple no meningeal signs SPINE/BACK:entire spine nontender CV: S1/S2 noted, no murmurs/rubs/gallops noted LUNGS: Lungs are clear to auscultation bilaterally, no apparent distress ABDOMEN: soft, mild epigastric tenderness, no rebound or guarding, bowel sounds noted throughout abdomen GU:no cva tenderness NEURO: Pt is awake/alert/appropriate, moves all extremitiesx4.  No facial droop.    EXTREMITIES: pulses normal/equal, full ROM SKIN: warm, color normal PSYCH: no abnormalities of mood noted, alert and oriented to situation  ED Course  Procedures   6:16 AM Pt now feeling improved and requesting PO fluids She does not want to provide a urine sample  7:15 AM Pt feels improved She is taking PO She denies any other pain at this time She denied any CP - I doubt occult ACS at this time However given epigastric pain initially and vomiting, possible biliary colic or other acute abdominal emergency.  I discussed ordering 04/02/2003, however patient/family decided against.  I told them I can not rule out all acute abdominal emergencies with labs, however they elect to go home and f/u as outpatient We discussed strict ER return precautions BP 119/66 mmHg  Pulse 71  Temp(Src) 98.2 F (36.8 C) (Oral)  Resp 18  Ht 5\' 4"  (1.626 m)  Wt 130 lb (58.968 kg)  BMI 22.30 kg/m2  SpO2 98%  LMP 03/31/2003  Labs Review Labs Reviewed  COMPREHENSIVE METABOLIC PANEL - Abnormal; Notable for the following:    Glucose, Bld 137 (*)    All other components within normal limits  CBC WITH DIFFERENTIAL - Abnormal; Notable for the following:    WBC 13.4 (*)    RBC 3.76 (*)    Neutrophils Relative % 89 (*)    Neutro Abs 11.9 (*)    Lymphocytes Relative 5 (*)    All other components within normal limits  LIPASE, BLOOD  I-STAT CG4 LACTIC ACID, ED  I-STAT TROPOININ, ED     EKG  Date: 09/22/2014 0451am  Rate: 89  Rhythm: normal sinus rhythm  QRS Axis: right  Intervals: normal  ST/T Wave abnormalities: nonspecific ST changes  Conduction Disutrbances:none  Narrative Interpretation: significant artifact   Repeat EKG  Date: 09/22/2014 0546am  Rate: 73  Rhythm: normal sinus rhythm  QRS Axis: normal  Intervals: normal  ST/T Wave abnormalities: nonspecific ST changes  Conduction Disutrbances:none  Narrative Interpretation: no ischemic changes on this EKG.  Difficult to compare to prior due  to significant artifact on initial EKG  Medications  ondansetron (ZOFRAN) injection 4 mg (4 mg Intravenous Given 09/22/14 0517)     MDM   Final diagnoses:  Non-intractable vomiting with nausea, vomiting of unspecified type  Epigastric abdominal pain    Nursing notes including past medical history and social history reviewed and considered in documentation Labs/vital reviewed myself and considered during evaluation     14/03/2014, MD 09/22/14 [phone]

## 2014-09-22 NOTE — ED Notes (Signed)
Patient woke up with n/v at 0100.  She reports emesis every 15 min.  She denies any diarrhea.  No one else is sick at home.  Patient states she has a burning pain in her abdomen.  No hx of same.  Patient is weak.

## 2014-09-22 NOTE — Discharge Instructions (Signed)

## 2015-08-13 ENCOUNTER — Encounter: Payer: Self-pay | Admitting: Gynecology

## 2016-08-03 ENCOUNTER — Encounter: Payer: Self-pay | Admitting: Rheumatology

## 2016-08-09 ENCOUNTER — Telehealth: Payer: Self-pay | Admitting: Rheumatology

## 2016-08-09 NOTE — Telephone Encounter (Signed)
Patient had labs done last Wednesday at [facility], calling for results.

## 2016-08-09 NOTE — Telephone Encounter (Signed)
Advised patient labs WNL Vitamin D is 57 / she should continue to use Vit D 3 2000 units day CBC CMP are in [facility]/ WNL

## 2016-08-11 ENCOUNTER — Encounter: Payer: Self-pay | Admitting: Gynecology

## 2016-08-17 ENCOUNTER — Telehealth: Payer: Self-pay | Admitting: Radiology

## 2016-08-17 NOTE — Telephone Encounter (Signed)
Called to advise [facility] placard applic. ready for p/u

## 2016-09-17 ENCOUNTER — Other Ambulatory Visit: Payer: Self-pay | Admitting: *Deleted

## 2016-09-17 ENCOUNTER — Other Ambulatory Visit: Payer: Self-pay | Admitting: Rheumatology

## 2016-09-17 NOTE — Telephone Encounter (Signed)
Left a message for patient that we received a refill request and she is due for labs.

## 2016-09-20 MED ORDER — FOLIC ACID 1 MG PO TABS: 2.0000 mg | ORAL_TABLET | Freq: Every day | ORAL | 3 refills | Status: DC

## 2016-09-20 MED ORDER — METHOTREXATE SODIUM CHEMO INJECTION 50 MG/2ML: 20.0000 mg | INTRAMUSCULAR | 0 refills | Status: DC

## 2016-09-20 NOTE — Telephone Encounter (Signed)
Last Visit: 05/31/16 Next Visit: 11/08/16 Labs: 08/03/16 WNL  Okay to refill MTX and Folic acid?

## 2016-09-20 NOTE — Addendum Note (Signed)
Addended by: Henriette Combs on: 09/20/2016 11:30 AM   Modules accepted: Orders

## 2016-09-20 NOTE — Telephone Encounter (Addendum)
Patient returned Andrea's call; she had her labs done on 10/17 at [facility] on [address]. Please send rx for MTX and folic acid

## 2016-10-20 ENCOUNTER — Other Ambulatory Visit: Payer: Self-pay | Admitting: Rheumatology

## 2016-10-20 MED ORDER — "TUBERCULIN-ALLERGY SYRINGES 27G X 1/2"" 1 ML KIT": PACK | 0 refills | Status: DC

## 2016-10-20 NOTE — Telephone Encounter (Signed)
ok 

## 2016-10-20 NOTE — Telephone Encounter (Signed)
Last Visit: 05/31/16 Next Visit: 11/08/16 Labs: 08/03/16 WNL  Okay to refill Syringes?

## 2016-10-20 NOTE — Telephone Encounter (Signed)
Patient needs a refill of syringes sent to [facility] on [address].

## 2016-11-08 ENCOUNTER — Ambulatory Visit (INDEPENDENT_AMBULATORY_CARE_PROVIDER_SITE_OTHER): Payer: Self-pay | Admitting: Rheumatology

## 2016-11-08 ENCOUNTER — Ambulatory Visit: Payer: Self-pay | Admitting: Rheumatology

## 2016-11-08 ENCOUNTER — Encounter: Payer: Self-pay | Admitting: Rheumatology

## 2016-11-08 VITALS — BP 139/74 | HR 72 | Resp 14 | Ht 64.0 in | Wt 150.0 lb

## 2016-11-08 DIAGNOSIS — M0579 Rheumatoid arthritis with rheumatoid factor of multiple sites without organ or systems involvement: Secondary | ICD-10-CM

## 2016-11-08 DIAGNOSIS — M722 Plantar fascial fibromatosis: Secondary | ICD-10-CM

## 2016-11-08 DIAGNOSIS — M810 Age-related osteoporosis without current pathological fracture: Secondary | ICD-10-CM

## 2016-11-08 DIAGNOSIS — Z79899 Other long term (current) drug therapy: Secondary | ICD-10-CM

## 2016-11-08 DIAGNOSIS — M19042 Primary osteoarthritis, left hand: Secondary | ICD-10-CM

## 2016-11-08 DIAGNOSIS — M19041 Primary osteoarthritis, right hand: Secondary | ICD-10-CM

## 2016-11-08 NOTE — Progress Notes (Signed)
Office Visit Note  Patient: Heather Koch             Date of Birth: 10/05/1951           MRN: [id number]             PCP: Myriam Jacobson, MD Referring: Lorene Dy, MD Visit Date: 11/08/2016 Occupation: @GUAROCC @    Subjective:  No chief complaint on file. Follow-up on rheumatoid arthritis  History of Present Illness: Heather Koch is a 66 y.o. female  Last seen 05/31/2016 Patient states that she is doing well with her rheumatoid arthritis. She's taking methotrexate 0.8 ML's per week, folic acid 2 mg daily, sulfasalazine 2 pills twice a day.  She states that she wants to consider using the pills in the future but right now she'll stay with the injectable methotrexate.  She has a history of osteoporosis with a T score of -2.5 on December 2017 labs. We offered her Fosamax and were prescription for it but she declined to take it because she was having "dental work done". She is afraid of the side effects of the medication and her bones becoming more brittle area She states "I'm not a medicine person" and wants to avoid medication when possible. She also states that she spoke with her dentist who suggested that she might benefit from getting injectable treatment for osteoporosis rather than the pills. I informed the patient that we have to fail Fosamax before we can move on to the other treatment options.   Activities of Daily Living:  Patient reports morning stiffness for 15 minute.   Patient Denies nocturnal pain.  Difficulty dressing/grooming: Denies Difficulty climbing stairs: Denies Difficulty getting out of chair: Denies Difficulty using hands for taps, buttons, cutlery, and/or writing: Denies   Review of Systems  Constitutional: Negative for fatigue.  HENT: Negative for mouth sores and mouth dryness.   Eyes: Negative for dryness.  Respiratory: Negative for shortness of breath.   Gastrointestinal: Negative for constipation and diarrhea.  Musculoskeletal:  Negative for myalgias and myalgias.  Skin: Negative for sensitivity to sunlight.  Psychiatric/Behavioral: Negative for decreased concentration and sleep disturbance.    PMFS History:  Patient Active Problem List   Diagnosis Date Noted  . ANXIETY 01/31/2008  . CONTACT DERMATITIS&OTHER ECZEMA DUE TO PLANTS 01/31/2008  . ALLERGIC RHINITIS 06/29/2007  . COLONIC POLYPS, HX OF 06/29/2007    Past Medical History:  Diagnosis Date  . ASCUS (atypical squamous cells of undetermined significance) on Pap smear 12/2008   neg HR HPV  . LGSIL (low grade squamous intraepithelial dysplasia) 2005/2006  . Lichen sclerosus 4270  . Rheumatoid arthritis(714.0) 2012    Family History  Problem Relation Age of Onset  . Hypertension Mother   . Diabetes Mother   . Cancer Father     liver  . Heart disease Sister   . Heart disease Brother   . Cancer Brother     pancreatic cancer  . Cancer Maternal Aunt     throat  . Heart disease Sister   . Heart disease Sister   . Cancer Maternal Uncle     Colon   Past Surgical History:  Procedure Laterality Date  . TUBAL LIGATION  1997   Social History   Social History Narrative  . No narrative on file     Objective: Vital Signs: BP 139/74 (BP Location: Right Arm, Patient Position: Sitting)   Pulse 72   Resp 14   Ht 5' 4"  (1.626 m)  Wt 150 lb (68 kg)   LMP 03/31/2003   BMI 25.75 kg/m    Physical Exam  Constitutional: She is oriented to person, place, and time. She appears well-developed and well-nourished.  HENT:  Head: Normocephalic and atraumatic.  Eyes: EOM are normal. Pupils are equal, round, and reactive to light.  Cardiovascular: Normal rate, regular rhythm and normal heart sounds.  Exam reveals no gallop and no friction rub.   No murmur heard. Pulmonary/Chest: Effort normal and breath sounds normal. She has no wheezes. She has no rales.  Abdominal: Soft. Bowel sounds are normal. She exhibits no distension. There is no tenderness. There  is no guarding. No hernia.  Musculoskeletal: Normal range of motion. She exhibits no edema, tenderness or deformity.  Lymphadenopathy:    She has no cervical adenopathy.  Neurological: She is alert and oriented to person, place, and time. Coordination normal.  Skin: Skin is warm and dry. Capillary refill takes less than 2 seconds. No rash noted.  Psychiatric: She has a normal mood and affect. Her behavior is normal.     Musculoskeletal Exam:  Full range of motion of all joints  CDAI Exam: CDAI Homunculus Exam:   Joint Counts:  CDAI Tender Joint count: 0 CDAI Swollen Joint count: 0  Global Assessments:  Patient Global Assessment: 0 Provider Global Assessment: 0  No synovitis on examination  Investigation: No additional findings.  No visits with results within 6 Month(s) from this visit.  Latest known visit with results is:  Admission on 09/22/2014, Discharged on 09/22/2014  Component Date Value Ref Range Status  . Sodium 09/22/2014 141  137 - 147 mEq/L Final  . Potassium 09/22/2014 3.8  3.7 - 5.3 mEq/L Final  . Chloride 09/22/2014 102  96 - 112 mEq/L Final  . CO2 09/22/2014 25  19 - 32 mEq/L Final  . Glucose, Bld 09/22/2014 137* 70 - 99 mg/dL Final  . BUN 09/22/2014 15  6 - 23 mg/dL Final  . Creatinine, Ser 09/22/2014 0.56  0.50 - 1.10 mg/dL Final  . Calcium 09/22/2014 9.7  8.4 - 10.5 mg/dL Final  . Total Protein 09/22/2014 7.4  6.0 - 8.3 g/dL Final  . Albumin 09/22/2014 4.0  3.5 - 5.2 g/dL Final  . AST 09/22/2014 23  0 - 37 U/L Final  . ALT 09/22/2014 16  0 - 35 U/L Final  . Alkaline Phosphatase 09/22/2014 75  39 - 117 U/L Final  . Total Bilirubin 09/22/2014 0.3  0.3 - 1.2 mg/dL Final  . GFR calc non Af Amer 09/22/2014 >90  >90 mL/min Final  . GFR calc Af Amer 09/22/2014 >90  >90 mL/min Final   Comment: (NOTE) The eGFR has been calculated using the CKD EPI equation. This calculation has not been validated in all clinical situations. eGFR's persistently <90 mL/min  signify possible Chronic Kidney Disease.   . Anion gap 09/22/2014 14  5 - 15 Final  . WBC 09/22/2014 13.4* 4.0 - 10.5 K/uL Final  . RBC 09/22/2014 3.76* 3.87 - 5.11 MIL/uL Final  . Hemoglobin 09/22/2014 12.2  12.0 - 15.0 g/dL Final  . HCT 09/22/2014 37.3  36.0 - 46.0 % Final  . MCV 09/22/2014 99.2  78.0 - 100.0 fL Final  . MCH 09/22/2014 32.4  26.0 - 34.0 pg Final  . MCHC 09/22/2014 32.7  30.0 - 36.0 g/dL Final  . RDW 09/22/2014 13.9  11.5 - 15.5 % Final  . Platelets 09/22/2014 199  150 - 400 K/uL Final  . Neutrophils  Relative % 09/22/2014 89* 43 - 77 % Final  . Neutro Abs 09/22/2014 11.9* 1.7 - 7.7 K/uL Final  . Lymphocytes Relative 09/22/2014 5* 12 - 46 % Final  . Lymphs Abs 09/22/2014 0.7  0.7 - 4.0 K/uL Final  . Monocytes Relative 09/22/2014 6  3 - 12 % Final  . Monocytes Absolute 09/22/2014 0.8  0.1 - 1.0 K/uL Final  . Eosinophils Relative 09/22/2014 0  0 - 5 % Final  . Eosinophils Absolute 09/22/2014 0.0  0.0 - 0.7 K/uL Final  . Basophils Relative 09/22/2014 0  0 - 1 % Final  . Basophils Absolute 09/22/2014 0.0  0.0 - 0.1 K/uL Final  . Lipase 09/22/2014 51  11 - 59 U/L Final  . Lactic Acid, Venous 09/22/2014 1.08  0.5 - 2.2 mmol/L Final  . Troponin i, poc 09/22/2014 0.00  0.00 - 0.08 ng/mL Final  . Comment 3 09/22/2014          Final   Comment: Due to the release kinetics of cTnI, a negative result within the first hours of the onset of symptoms does not rule out myocardial infarction with certainty. If myocardial infarction is still suspected, repeat the test at appropriate intervals.    These labs are from December 2015. I advised the patient to get labs done today while she is an office. She declined. Her last labs are from 08/04/2016 at [facility]. CBC with differential and CMP with GFR normal. She states that she wants to be fasting and will return to clinic in a week for updated labs  Imaging: No results found.  Speciality Comments: No specialty comments  available.    Procedures:  No procedures performed Allergies: Patient has no known allergies.   Assessment / Plan:     Visit Diagnoses: Rheumatoid arthritis with rheumatoid factor of multiple sites without organ or systems involvement ([place])  High risk medications (not anticoagulants) long-term use - mtx 9.1TA/VW; folic 2/d; ssz 2 bid; adequate response - Plan: CBC with Differential/Platelet, Comprehensive metabolic panel  Osteoporosis without current pathological fracture, unspecified osteoporosis type - feb 2017 = t = -2.5 = fosamax q week  Primary osteoarthritis of both hands  Plantar fasciitis, bilateral    Plan: #1: Rheumatoid arthritis. Well-controlled with methotrexate and sulfasalazine. Patient does not want injectable but does not want to switch from injectable 2 pills yet. She will consider it and let us know. I offered her to switch her to the pills as soon as she is ready and she states that she's not ready yet but she was just wanted to think about it in the water options were.  #2: Adequate response with therapy with methotrexate and sulfasalazine.  #3: History of osteoporosis with a T score of -2.5 on DEXA done on December 2017. I called in Fosamax for her on August 2017 visit patient states that she has not taken it yet. She spoke with her dentist who suggested that she might be interested in injectable treatment for osteoporosis. I informed the patient that she would have to fail Fosamax first before we go with injectable treatment options.  #4: Until labs are updated, I cannot refill her any of her medications.  #5: She'll come back this week for CBC with differential CMP with GFR and get them every 3 months   #6: She'll call us when she wants [place] to call in Fosamax for her. #7: Return to clinic in 5 months     Orders: Orders Placed This Encounter  Procedures  . CBC with Differential/Platelet  . Comprehensive metabolic panel   No orders of the defined types  were placed in this encounter.   Face-to-face time spent with patient was 30 minutes. 50% of time was spent in counseling and coordination of care.  Follow-Up Instructions: Return in about 5 months (around 04/08/2017) for ra,mtx 0.8, ssz 2 bid, plantar fasciitis, oa hand.   Eliezer Lofts, PA-C  Note - This record has been created using [employer].  Chart creation errors have been sought, but may not always  have been located. Such creation errors do not reflect on  the standard of medical care.

## 2016-11-11 ENCOUNTER — Telehealth: Payer: Self-pay | Admitting: Rheumatology

## 2016-11-11 NOTE — Telephone Encounter (Signed)
Patient has questions about lab work. Please call patient.

## 2016-11-11 NOTE — Telephone Encounter (Signed)
Patient would like to know how much her lab work would cost to have drawn being that she is self pay. Patient states she has been having it done at the [facility] site and wants to see if there is any difference in price to have them drawn here at the office. Advised patient would speak with [employer] and give her a call back with the information.

## 2016-11-16 NOTE — Telephone Encounter (Signed)
Our lab is [facility], so would be the same. I have advised patient.

## 2016-11-17 ENCOUNTER — Other Ambulatory Visit: Payer: Self-pay | Admitting: *Deleted

## 2016-11-17 DIAGNOSIS — Z79899 Other long term (current) drug therapy: Secondary | ICD-10-CM

## 2016-11-17 LAB — CBC WITH DIFFERENTIAL/PLATELET
Basophils Absolute: 0 cells/uL (ref 0–200)
Basophils Relative: 0 %
Eosinophils Absolute: 112 cells/uL (ref 15–500)
Eosinophils Relative: 2 %
HCT: 36 % (ref 35.0–45.0)
Hemoglobin: 12 g/dL (ref 11.7–15.5)
Lymphocytes Relative: 34 %
Lymphs Abs: 1904 cells/uL (ref 850–3900)
MCH: 32.9 pg (ref 27.0–33.0)
MCHC: 33.3 g/dL (ref 32.0–36.0)
MCV: 98.6 fL (ref 80.0–100.0)
MPV: 10.8 fL (ref 7.5–12.5)
Monocytes Absolute: 504 cells/uL (ref 200–950)
Monocytes Relative: 9 %
Neutro Abs: 3080 cells/uL (ref 1500–7800)
Neutrophils Relative %: 55 %
Platelets: 209 10*3/uL (ref 140–400)
RBC: 3.65 MIL/uL — ABNORMAL LOW (ref 3.80–5.10)
RDW: 13.9 % (ref 11.0–15.0)
WBC: 5.6 10*3/uL (ref 3.8–10.8)

## 2016-11-18 ENCOUNTER — Other Ambulatory Visit: Payer: Self-pay | Admitting: Rheumatology

## 2016-11-18 LAB — COMPREHENSIVE METABOLIC PANEL
ALT: 10 U/L (ref 6–29)
AST: 16 U/L (ref 10–35)
Albumin: 4.1 g/dL (ref 3.6–5.1)
Alkaline Phosphatase: 64 U/L (ref 33–130)
BUN: 11 mg/dL (ref 7–25)
CO2: 28 mmol/L (ref 20–31)
Calcium: 9.4 mg/dL (ref 8.6–10.4)
Chloride: 102 mmol/L (ref 98–110)
Creat: 0.63 mg/dL (ref 0.50–0.99)
Glucose, Bld: 108 mg/dL — ABNORMAL HIGH (ref 65–99)
Potassium: 4.1 mmol/L (ref 3.5–5.3)
Sodium: 139 mmol/L (ref 135–146)
Total Bilirubin: 0.4 mg/dL (ref 0.2–1.2)
Total Protein: 7 g/dL (ref 6.1–8.1)

## 2016-11-18 NOTE — Telephone Encounter (Signed)
Last Visit: 11/08/16 Next visit: 04/14/17 Labs: 11/17/16 WNL  Okay to refill SSZ?

## 2016-11-22 ENCOUNTER — Telehealth: Payer: Self-pay | Admitting: Rheumatology

## 2016-11-22 NOTE — Telephone Encounter (Signed)
Patient advised of lab results

## 2016-11-22 NOTE — Telephone Encounter (Signed)
Patient returned Heather Koch call about her labs, please call back

## 2016-12-21 ENCOUNTER — Encounter: Payer: Self-pay | Admitting: Women's Health

## 2016-12-21 ENCOUNTER — Ambulatory Visit (INDEPENDENT_AMBULATORY_CARE_PROVIDER_SITE_OTHER): Payer: BLUE CROSS/BLUE SHIELD | Admitting: Women's Health

## 2016-12-21 VITALS — BP 138/82 | Ht 63.75 in | Wt 146.0 lb

## 2016-12-21 DIAGNOSIS — Z01419 Encounter for gynecological examination (general) (routine) without abnormal findings: Secondary | ICD-10-CM | POA: Diagnosis not present

## 2016-12-21 DIAGNOSIS — L900 Lichen sclerosus et atrophicus: Secondary | ICD-10-CM

## 2016-12-21 DIAGNOSIS — Z1151 Encounter for screening for human papillomavirus (HPV): Secondary | ICD-10-CM

## 2016-12-21 MED ORDER — CLOBETASOL PROPIONATE 0.05 % EX CREA: TOPICAL_CREAM | CUTANEOUS | 1 refills | Status: DC

## 2016-12-21 NOTE — Patient Instructions (Signed)
Health Maintenance for Postmenopausal Women Menopause is a normal process in which your reproductive ability comes to an end. This process happens gradually over a span of months to years, usually between the ages of 33 and 38. Menopause is complete when you have missed 12 consecutive menstrual periods. It is important to talk with your health care provider about some of the most common conditions that affect postmenopausal women, such as heart disease, cancer, and bone loss (osteoporosis). Adopting a healthy lifestyle and getting preventive care can help to promote your health and wellness. Those actions can also lower your chances of developing some of these common conditions. What should I know about menopause? During menopause, you may experience a number of symptoms, such as:  Moderate-to-severe hot flashes.  Night sweats.  Decrease in sex drive.  Mood swings.  Headaches.  Tiredness.  Irritability.  Memory problems.  Insomnia. Choosing to treat or not to treat menopausal changes is an individual decision that you make with your health care provider. What should I know about hormone replacement therapy and supplements? Hormone therapy products are effective for treating symptoms that are associated with menopause, such as hot flashes and night sweats. Hormone replacement carries certain risks, especially as you become older. If you are thinking about using estrogen or estrogen with progestin treatments, discuss the benefits and risks with your health care provider. What should I know about heart disease and stroke? Heart disease, heart attack, and stroke become more likely as you age. This may be due, in part, to the hormonal changes that your body experiences during menopause. These can affect how your body processes dietary fats, triglycerides, and cholesterol. Heart attack and stroke are both medical emergencies. There are many things that you can do to help prevent heart disease  and stroke:  Have your blood pressure checked at least every 1-2 years. High blood pressure causes heart disease and increases the risk of stroke.  If you are 48-61 years old, ask your health care provider if you should take aspirin to prevent a heart attack or a stroke.  Do not use any tobacco products, including cigarettes, chewing tobacco, or electronic cigarettes. If you need help quitting, ask your health care provider.  It is important to eat a healthy diet and maintain a healthy weight.  Be sure to include plenty of vegetables, fruits, low-fat dairy products, and lean protein.  Avoid eating foods that are high in solid fats, added sugars, or salt (sodium).  Get regular exercise. This is one of the most important things that you can do for your health.  Try to exercise for at least 150 minutes each week. The type of exercise that you do should increase your heart rate and make you sweat. This is known as moderate-intensity exercise.  Try to do strengthening exercises at least twice each week. Do these in addition to the moderate-intensity exercise.  Know your numbers.Ask your health care provider to check your cholesterol and your blood glucose. Continue to have your blood tested as directed by your health care provider. What should I know about cancer screening? There are several types of cancer. Take the following steps to reduce your risk and to catch any cancer development as early as possible. Breast Cancer  Practice breast self-awareness.  This means understanding how your breasts normally appear and feel.  It also means doing regular breast self-exams. Let your health care provider know about any changes, no matter how small.  If you are 40 or older,  have a clinician do a breast exam (clinical breast exam or CBE) every year. Depending on your age, family history, and medical history, it may be recommended that you also have a yearly breast X-ray (mammogram).  If you  have a family history of breast cancer, talk with your health care provider about genetic screening.  If you are at high risk for breast cancer, talk with your health care provider about having an MRI and a mammogram every year.  Breast cancer (BRCA) gene test is recommended for women who have family members with BRCA-related cancers. Results of the assessment will determine the need for genetic counseling and BRCA1 and for BRCA2 testing. BRCA-related cancers include these types:  Breast. This occurs in males or females.  Ovarian.  Tubal. This may also be called fallopian tube cancer.  Cancer of the abdominal or pelvic lining (peritoneal cancer).  Prostate.  Pancreatic. Cervical, Uterine, and Ovarian Cancer  Your health care provider may recommend that you be screened regularly for cancer of the pelvic organs. These include your ovaries, uterus, and vagina. This screening involves a pelvic exam, which includes checking for microscopic changes to the surface of your cervix (Pap test).  For women ages 21-65, health care providers may recommend a pelvic exam and a Pap test every three years. For women ages 23-65, they may recommend the Pap test and pelvic exam, combined with testing for human papilloma virus (HPV), every five years. Some types of HPV increase your risk of cervical cancer. Testing for HPV may also be done on women of any age who have unclear Pap test results.  Other health care providers may not recommend any screening for nonpregnant women who are considered low risk for pelvic cancer and have no symptoms. Ask your health care provider if a screening pelvic exam is right for you.  If you have had past treatment for cervical cancer or a condition that could lead to cancer, you need Pap tests and screening for cancer for at least 20 years after your treatment. If Pap tests have been discontinued for you, your risk factors (such as having a new sexual partner) need to be reassessed  to determine if you should start having screenings again. Some women have medical problems that increase the chance of getting cervical cancer. In these cases, your health care provider may recommend that you have screening and Pap tests more often.  If you have a family history of uterine cancer or ovarian cancer, talk with your health care provider about genetic screening.  If you have vaginal bleeding after reaching menopause, tell your health care provider.  There are currently no reliable tests available to screen for ovarian cancer. Lung Cancer  Lung cancer screening is recommended for adults 99-83 years old who are at high risk for lung cancer because of a history of smoking. A yearly low-dose CT scan of the lungs is recommended if you:  Currently smoke.  Have a history of at least 30 pack-years of smoking and you currently smoke or have quit within the past 15 years. A pack-year is smoking an average of one pack of cigarettes per day for one year. Yearly screening should:  Continue until it has been 15 years since you quit.  Stop if you develop a health problem that would prevent you from having lung cancer treatment. Colorectal Cancer  This type of cancer can be detected and can often be prevented.  Routine colorectal cancer screening usually begins at age 72 and continues  through age 75.  If you have risk factors for colon cancer, your health care provider may recommend that you be screened at an earlier age.  If you have a family history of colorectal cancer, talk with your health care provider about genetic screening.  Your health care provider may also recommend using home test kits to check for hidden blood in your stool.  A small camera at the end of a tube can be used to examine your colon directly (sigmoidoscopy or colonoscopy). This is done to check for the earliest forms of colorectal cancer.  Direct examination of the colon should be repeated every 5-10 years until  age 75. However, if early forms of precancerous polyps or small growths are found or if you have a family history or genetic risk for colorectal cancer, you may need to be screened more often. Skin Cancer  Check your skin from head to toe regularly.  Monitor any moles. Be sure to tell your health care provider:  About any new moles or changes in moles, especially if there is a change in a mole's shape or color.  If you have a mole that is larger than the size of a pencil eraser.  If any of your family members has a history of skin cancer, especially at a [place] age, talk with your health care provider about genetic screening.  Always use sunscreen. Apply sunscreen liberally and repeatedly throughout the day.  Whenever you are outside, protect yourself by wearing long sleeves, pants, a wide-brimmed hat, and sunglasses. What should I know about osteoporosis? Osteoporosis is a condition in which bone destruction happens more quickly than new bone creation. After menopause, you may be at an increased risk for osteoporosis. To help prevent osteoporosis or the bone fractures that can happen because of osteoporosis, the following is recommended:  If you are 19-50 years old, get at least 1,000 mg of calcium and at least 600 mg of vitamin D per day.  If you are older than age 50 but younger than age 70, get at least 1,200 mg of calcium and at least 600 mg of vitamin D per day.  If you are older than age 70, get at least 1,200 mg of calcium and at least 800 mg of vitamin D per day. Smoking and excessive alcohol intake increase the risk of osteoporosis. Eat foods that are rich in calcium and vitamin D, and do weight-bearing exercises several times each week as directed by your health care provider. What should I know about how menopause affects my mental health? Depression may occur at any age, but it is more common as you become older. Common symptoms of depression include:  Low or sad  mood.  Changes in sleep patterns.  Changes in appetite or eating patterns.  Feeling an overall lack of motivation or enjoyment of activities that you previously enjoyed.  Frequent crying spells. Talk with your health care provider if you think that you are experiencing depression. What should I know about immunizations? It is important that you get and maintain your immunizations. These include:  Tetanus, diphtheria, and pertussis (Tdap) booster vaccine.  Influenza every year before the flu season begins.  Pneumonia vaccine.  Shingles vaccine. Your health care provider may also recommend other immunizations. This information is not intended to replace advice given to you by your health care provider. Make sure you discuss any questions you have with your health care provider. Document Released: 11/26/2005 Document Revised: 04/23/2016 Document Reviewed: 07/08/2015 Elsevier Interactive Patient   Education  2017 [employer]  

## 2016-12-21 NOTE — Progress Notes (Signed)
Heather Koch 01-17-1951 [id number]    History:    Presents for annual exam.  Postmenopausal on no HRT with no bleeding. Normal Pap and mammogram history. 2015 2 benign colon polyps 5 year recall. Rheumatoid arthritis on methotrexate has not had Zostavax.  Past medical history, past surgical history, family history and social history were all reviewed and documented in the EPIC chart. 3 children all doing well.  ROS:  A ROS was performed and pertinent positives and negatives are included.  Exam:  Vitals:   12/21/16 1428  BP: 138/82  Weight: 146 lb (66.2 kg)  Height: 5' 3.75" (1.619 m)   Body mass index is 25.26 kg/m.   General appearance:  Normal Thyroid:  Symmetrical, normal in size, without palpable masses or nodularity. Respiratory  Auscultation:  Clear without wheezing or rhonchi Cardiovascular  Auscultation:  Regular rate, without rubs, murmurs or gallops  Edema/varicosities:  Not grossly evident Abdominal  Soft,nontender, without masses, guarding or rebound.  Liver/spleen:  No organomegaly noted  Hernia:  None appreciated  Skin  Inspection:  Grossly normal   Breasts: Examined lying and sitting.     Right: Without masses, retractions, discharge or axillary adenopathy.     Left: Without masses, retractions, discharge or axillary adenopathy. Gentitourinary   Inguinal/mons:  Normal without inguinal adenopathy  External genitalia:  Normal  BUS/Urethra/Skene's glands:  Normal  Vagina:  Atrophic  Cervix:  Stenotic  Uterus:   normal in size, shape and contour.  Midline and mobile  Adnexa/parametria:     Rt: Without masses or tenderness.   Lt: Without masses or tenderness.  Anus and perineum: Normal  Digital rectal exam: Normal sphincter tone without palpated masses or tenderness  Assessment/Plan:  66 y.o. MWF G3 P3  for annual exam with no complaints.  Postmenopausal/no HRT/no bleeding Rheumatoid arthritis-rheumatologist manages DEXA Primary care manages labs and  meds History of lichen sclerosis-asymptomatic /none noted  Plan: SBE's, continue annual screening mammogram, calcium rich diet, vitamin D 2000 daily. Reports normal vitamin D level. Home safety, fall prevention and importance of weightbearing exercise reviewed. Refill a Temovate to be used only if increased external vaginal itching, aware symptoms of lichen sclerosis having none now. Pap with HR HPV typing, new screening guidelines reviewed.  Harrington Challenger [facility], 3:04 PM 12/21/2016

## 2016-12-21 NOTE — Addendum Note (Signed)
Addended by: Berna Spare A on: 12/21/2016 03:25 PM   Modules accepted: Orders

## 2016-12-23 LAB — PAP, TP IMAGING W/ HPV RNA, RFLX HPV TYPE 16,18/45: HPV mRNA, High Risk: NOT DETECTED

## 2016-12-30 ENCOUNTER — Other Ambulatory Visit: Payer: Self-pay | Admitting: Rheumatology

## 2016-12-30 NOTE — Telephone Encounter (Signed)
Last Visit: 11/08/16 Next visit: 04/14/17 Labs: 11/17/16 WNL  Okay to refill MTX and syringes?

## 2017-02-06 NOTE — Telephone Encounter (Signed)
Made in error

## 2017-02-22 ENCOUNTER — Other Ambulatory Visit: Payer: Self-pay

## 2017-02-22 DIAGNOSIS — Z79899 Other long term (current) drug therapy: Secondary | ICD-10-CM

## 2017-02-22 LAB — CBC WITH DIFFERENTIAL/PLATELET
Basophils Absolute: 0 cells/uL (ref 0–200)
Basophils Relative: 0 %
Eosinophils Absolute: 106 cells/uL (ref 15–500)
Eosinophils Relative: 2 %
HCT: 37.1 % (ref 35.0–45.0)
Hemoglobin: 12.1 g/dL (ref 11.7–15.5)
Lymphocytes Relative: 35 %
Lymphs Abs: 1855 cells/uL (ref 850–3900)
MCH: 32.8 pg (ref 27.0–33.0)
MCHC: 32.6 g/dL (ref 32.0–36.0)
MCV: 100.5 fL — ABNORMAL HIGH (ref 80.0–100.0)
MPV: 11.3 fL (ref 7.5–12.5)
Monocytes Absolute: 477 cells/uL (ref 200–950)
Monocytes Relative: 9 %
Neutro Abs: 2862 cells/uL (ref 1500–7800)
Neutrophils Relative %: 54 %
Platelets: 224 10*3/uL (ref 140–400)
RBC: 3.69 MIL/uL — ABNORMAL LOW (ref 3.80–5.10)
RDW: 14.5 % (ref 11.0–15.0)
WBC: 5.3 10*3/uL (ref 3.8–10.8)

## 2017-02-22 LAB — COMPREHENSIVE METABOLIC PANEL
ALT: 14 U/L (ref 6–29)
AST: 19 U/L (ref 10–35)
Albumin: 4 g/dL (ref 3.6–5.1)
Alkaline Phosphatase: 55 U/L (ref 33–130)
BUN: 11 mg/dL (ref 7–25)
CO2: 25 mmol/L (ref 20–31)
Calcium: 9.2 mg/dL (ref 8.6–10.4)
Chloride: 105 mmol/L (ref 98–110)
Creat: 0.64 mg/dL (ref 0.50–0.99)
Glucose, Bld: 92 mg/dL (ref 65–99)
Potassium: 4.2 mmol/L (ref 3.5–5.3)
Sodium: 140 mmol/L (ref 135–146)
Total Bilirubin: 0.5 mg/dL (ref 0.2–1.2)
Total Protein: 7 g/dL (ref 6.1–8.1)

## 2017-03-16 ENCOUNTER — Other Ambulatory Visit: Payer: Self-pay | Admitting: Rheumatology

## 2017-03-17 NOTE — Telephone Encounter (Signed)
Last Visit: 11/08/16 Next Visit: 04/14/17 Labs: 02/22/17 C/w previous labs  Okay to refill SSZ and MTX?

## 2017-04-07 ENCOUNTER — Other Ambulatory Visit: Payer: Self-pay | Admitting: Rheumatology

## 2017-04-07 DIAGNOSIS — M19041 Primary osteoarthritis, right hand: Secondary | ICD-10-CM | POA: Insufficient documentation

## 2017-04-07 DIAGNOSIS — M8589 Other specified disorders of bone density and structure, multiple sites: Secondary | ICD-10-CM | POA: Insufficient documentation

## 2017-04-07 DIAGNOSIS — L920 Granuloma annulare: Secondary | ICD-10-CM | POA: Insufficient documentation

## 2017-04-07 DIAGNOSIS — M19042 Primary osteoarthritis, left hand: Secondary | ICD-10-CM

## 2017-04-07 DIAGNOSIS — Z79899 Other long term (current) drug therapy: Secondary | ICD-10-CM | POA: Insufficient documentation

## 2017-04-07 DIAGNOSIS — E559 Vitamin D deficiency, unspecified: Secondary | ICD-10-CM | POA: Insufficient documentation

## 2017-04-07 DIAGNOSIS — M0579 Rheumatoid arthritis with rheumatoid factor of multiple sites without organ or systems involvement: Secondary | ICD-10-CM | POA: Insufficient documentation

## 2017-04-07 NOTE — Telephone Encounter (Signed)
Last Visit: 11/08/16 Next Visit: 04/14/17  Okay to refill syringes?

## 2017-04-07 NOTE — Telephone Encounter (Signed)
ok 

## 2017-04-07 NOTE — Progress Notes (Signed)
Office Visit Note  Patient: Heather Koch             Date of Birth: 1950-12-06           MRN: [id number]             PCP: Burton Apley, MD Referring: Burton Apley, MD Visit Date: 04/08/2017 Occupation: @GUAROCC @    Subjective:  Medication management   History of Present Illness: Heather Koch is a 66 y.o. female with history of sero positive rheumatoid arthritis. She is doing quite well on current combination of medication. She denies any joint pain or joint swelling.  Activities of Daily Living:  Patient reports morning stiffness for 0 minute.   Patient Denies nocturnal pain.  Difficulty dressing/grooming: Denies Difficulty climbing stairs: Denies Difficulty getting out of chair: Denies Difficulty using hands for taps, buttons, cutlery, and/or writing: Denies   Review of Systems  Constitutional: Negative for fatigue, night sweats, weight gain, weight loss and weakness.  HENT: Negative for mouth sores, trouble swallowing, trouble swallowing, mouth dryness and nose dryness.   Eyes: Negative for pain, redness, visual disturbance and dryness.  Respiratory: Negative for cough, shortness of breath and difficulty breathing.   Cardiovascular: Negative for chest pain, palpitations, hypertension, irregular heartbeat and swelling in legs/feet.  Gastrointestinal: Negative for blood in stool, constipation and diarrhea.  Endocrine: Negative for increased urination.  Genitourinary: Negative for vaginal dryness.  Musculoskeletal: Negative for arthralgias, joint pain, joint swelling, myalgias, muscle weakness, morning stiffness, muscle tenderness and myalgias.  Skin: Negative for color change, rash, hair loss, skin tightness, ulcers and sensitivity to sunlight.  Allergic/Immunologic: Negative for susceptible to infections.  Neurological: Negative for dizziness, memory loss and night sweats.  Hematological: Negative for swollen glands.  Psychiatric/Behavioral: Negative for depressed mood  and sleep disturbance. The patient is nervous/anxious.     PMFS History:  Patient Active Problem List   Diagnosis Date Noted  . Rheumatoid arthritis involving multiple sites with positive rheumatoid factor (HCC) 04/07/2017  . High risk medication use 04/07/2017  . Primary osteoarthritis of both hands 04/07/2017  . Vitamin D deficiency 04/07/2017  . History of Granuloma annulare 04/07/2017  . Osteopenia of multiple sites 04/07/2017  . ANXIETY 01/31/2008  . CONTACT DERMATITIS&OTHER ECZEMA DUE TO PLANTS 01/31/2008  . ALLERGIC RHINITIS 06/29/2007  . COLONIC POLYPS, HX OF 06/29/2007    Past Medical History:  Diagnosis Date  . ASCUS (atypical squamous cells of undetermined significance) on Pap smear 12/2008   neg HR HPV  . LGSIL (low grade squamous intraepithelial dysplasia) 2005/2006  . Lichen sclerosus 2012  . Rheumatoid arthritis(714.0) 2012    Family History  Problem Relation Age of Onset  . Hypertension Mother   . Diabetes Mother   . Cancer Father        liver  . Heart disease Sister   . Heart disease Brother   . Cancer Brother        pancreatic cancer  . Cancer Maternal Aunt        throat  . Heart disease Sister   . Heart disease Sister   . Cancer Maternal Uncle        Colon   Past Surgical History:  Procedure Laterality Date  . TUBAL LIGATION  1997   Social History   Social History Narrative  . No narrative on file     Objective: Vital Signs: BP (!) 141/70   Pulse 71   Resp 14   Ht 5\' 4"  (1.626 m)  Wt 153 lb (69.4 kg)   LMP 03/31/2003   BMI 26.26 kg/m    Physical Exam  Constitutional: She is oriented to person, place, and time. She appears well-developed and well-nourished.  HENT:  Head: Normocephalic and atraumatic.  Eyes: Conjunctivae and EOM are normal.  Neck: Normal range of motion.  Cardiovascular: Normal rate, regular rhythm, normal heart sounds and intact distal pulses.   Pulmonary/Chest: Effort normal. She has rales.  Bilateral lung  bases  Abdominal: Soft. Bowel sounds are normal.  Lymphadenopathy:    She has no cervical adenopathy.  Neurological: She is alert and oriented to person, place, and time.  Skin: Skin is warm and dry. Capillary refill takes less than 2 seconds.  Nodulosis was noted on bilateral feet  Psychiatric: She has a normal mood and affect. Her behavior is normal.  Nursing note and vitals reviewed.    Musculoskeletal Exam: C-spine and thoracic lumbar spine good range of motion. Shoulder joints elbow joints wrist joints are good range of motion. She is synovial thickening over bilateral second and third MCP joints with no synovitis she also has some ulnar deviation. No active synovitis was noted. Hip joints, knee joints, ankle joints, MTPs PIPs with good range of motion.  CDAI Exam: CDAI Homunculus Exam:   Joint Counts:  CDAI Tender Joint count: 0 CDAI Swollen Joint count: 0  Global Assessments:  Patient Global Assessment: 2 Provider Global Assessment: 1  CDAI Calculated Score: 3    Investigation: Findings:  12/11/2015   X-rays of bilateral hands, 2 views, versus August 2012 show bilateral radiocarpal joint space narrowing as well as 1st MCP narrowing.  Bilateral DIP/PIP joint space narrowing with slight progression verus August 2012.  No erosions.    X-rays of bilateral feet, 2 views, versus 2012 show bilateral DIP/PIP joint space narrowing.  Possible cystic lesions versus early erosive changes to bilateral 1st DIP joint.    DEXA done February 2012 shows a T-score of -1.1.    CBC Latest Ref Rng & Units 02/22/2017 11/17/2016 09/22/2014  WBC 3.8 - 10.8 K/uL 5.3 5.6 13.4(H)  Hemoglobin 11.7 - 15.5 g/dL 82.7 07.8 67.5  Hematocrit 35.0 - 45.0 % 37.1 36.0 37.3  Platelets 140 - 400 K/uL 224 209 199    CMP Latest Ref Rng & Units 02/22/2017 11/17/2016 09/22/2014  Glucose 65 - 99 mg/dL 92 449(E) 010(O)  BUN 7 - 25 mg/dL 11 11 15   Creatinine 0.50 - 0.99 mg/dL 7.12 1.97  Sodium 135 - 146 mmol/L 140  139 141  Potassium 3.5 - 5.3 mmol/L 4.2 4.1 3.8  Chloride 98 - 110 mmol/L 105 102 102  CO2 20 - 31 mmol/L 25 28 25   Calcium 8.6 - 10.4 mg/dL 9.2 9.4 9.7  Total Protein 6.1 - 8.1 g/dL 7.0 7.0 7.4  Total Bilirubin 0.2 - 1.2 mg/dL 0.5 0.4 0.3  Alkaline Phos 33 - 130 U/L 55 64 75  AST 10 - 35 U/L 19 16 23   ALT 6 - 29 U/L 14 10 16     Imaging: No results found.  Speciality Comments: No specialty comments available.    Procedures:  No procedures performed Allergies: Patient has no known allergies.   Assessment / Plan:     Visit Diagnoses: Rheumatoid arthritis involving multiple sites with positive rheumatoid factor (HCC) - +CCP +RF -ANA. She does not have any active synovitis although she does have synovial thickening in her MCP joints.  Rheumatoid nodulosis (HCC): She had some nodulosis on examination today on  her feet. I offered cortisone injection to the nodules but she declined.  On exam today she had crackles in bilateral lung bases. I'll obtain a chest x-ray today. She may need high resolution CT to evaluate further.  High risk medication use - MTX 0.8 ML subcutaneous every week, folic acid 2 mg by mouth daily and SSZ 500 mg 2 tablets by mouth twice a day - Plan: DG Chest 2 View  Primary osteoarthritis of both hands: Joint protection and muscle strengthening discussed.  Osteopenia of multiple sites -   T score of -2.5 on DEXA done on December 2017, discussed Fosamax last visit  Vitamin D deficiency  History of colon polyps  History of Granuloma annulare    Orders: Orders Placed This Encounter  Procedures  . DG Chest 2 View  . VITAMIN D 25 Hydroxy (Vit-D Deficiency, Fractures)   No orders of the defined types were placed in this encounter.   Face-to-face time spent with patient was 30 minutes. 50% of time was spent in counseling and coordination of care.  Follow-Up Instructions: Return in about 4 months (around 08/08/2017) for Rheumatoid arthritis.   Pollyann Savoy, MD  Note - This record has been created using Animal nutritionist.  Chart creation errors have been sought, but may not always  have been located. Such creation errors do not reflect on  the standard of medical care.

## 2017-04-08 ENCOUNTER — Ambulatory Visit (INDEPENDENT_AMBULATORY_CARE_PROVIDER_SITE_OTHER): Payer: BLUE CROSS/BLUE SHIELD | Admitting: Rheumatology

## 2017-04-08 ENCOUNTER — Ambulatory Visit (HOSPITAL_COMMUNITY)
Admission: RE | Admit: 2017-04-08 | Discharge: 2017-04-08 | Disposition: A | Discharge status: Home / Self Care | Payer: BLUE CROSS/BLUE SHIELD | Source: Ambulatory Visit | Attending: Rheumatology | Admitting: Rheumatology

## 2017-04-08 ENCOUNTER — Encounter: Payer: Self-pay | Admitting: Rheumatology

## 2017-04-08 VITALS — BP 141/70 | HR 71 | Resp 14 | Ht 64.0 in | Wt 153.0 lb

## 2017-04-08 DIAGNOSIS — L920 Granuloma annulare: Secondary | ICD-10-CM

## 2017-04-08 DIAGNOSIS — M8589 Other specified disorders of bone density and structure, multiple sites: Secondary | ICD-10-CM

## 2017-04-08 DIAGNOSIS — Z86010 Personal history of colonic polyps: Secondary | ICD-10-CM

## 2017-04-08 DIAGNOSIS — M0630 Rheumatoid nodule, unspecified site: Secondary | ICD-10-CM

## 2017-04-08 DIAGNOSIS — Z79899 Other long term (current) drug therapy: Secondary | ICD-10-CM | POA: Insufficient documentation

## 2017-04-08 DIAGNOSIS — M19042 Primary osteoarthritis, left hand: Secondary | ICD-10-CM | POA: Diagnosis not present

## 2017-04-08 DIAGNOSIS — E559 Vitamin D deficiency, unspecified: Secondary | ICD-10-CM

## 2017-04-08 DIAGNOSIS — M0579 Rheumatoid arthritis with rheumatoid factor of multiple sites without organ or systems involvement: Secondary | ICD-10-CM | POA: Diagnosis not present

## 2017-04-08 DIAGNOSIS — M19041 Primary osteoarthritis, right hand: Secondary | ICD-10-CM | POA: Diagnosis not present

## 2017-04-08 DIAGNOSIS — M069 Rheumatoid arthritis, unspecified: Secondary | ICD-10-CM | POA: Diagnosis present

## 2017-04-08 NOTE — Progress Notes (Signed)
I discussed with Dr. Marchelle Gearing. He recommends getting HRCT chest for crackles in the lung bases with h/o RA.Please, sch HRCT.

## 2017-04-08 NOTE — Progress Notes (Signed)
Please notify patient that the chest x-ray was normal. Although, I will contact pulmonologist to see if we need any further studies like high-resolution CT.

## 2017-04-08 NOTE — Patient Instructions (Addendum)
Please could do [facility] radiology department for a chest x-ray  Standing Labs We placed an order today for your standing lab work.    Please come back and get your standing labs in August and every 3 months  We have open lab Monday through Friday from 8:30-11:30 AM and 1:30-4 PM at the office of Dr. Arbutus Ped, PA.   The office is located at [address], [address], [place], [place] [address] No appointment is necessary.   Labs are drawn by [facility].  You may receive a bill from [facility] for your lab work. If you have any questions regarding directions or hours of operation,  please call [phone].

## 2017-04-12 ENCOUNTER — Telehealth: Payer: Self-pay | Admitting: Radiology

## 2017-04-12 DIAGNOSIS — M0579 Rheumatoid arthritis with rheumatoid factor of multiple sites without organ or systems involvement: Secondary | ICD-10-CM

## 2017-04-12 DIAGNOSIS — R0989 Other specified symptoms and signs involving the circulatory and respiratory systems: Secondary | ICD-10-CM | POA: Insufficient documentation

## 2017-04-12 NOTE — Telephone Encounter (Signed)
Advised patient.She is concerned about the cost of the study. She states her Medicare will start in September, is it okay to wait until September to have the scan, so she will not have to pay in full?   She has a BCBS plan through the health care exchange and states it covers very little of her health care expenses.

## 2017-04-12 NOTE — Telephone Encounter (Signed)
She has crackles in her lung bases and she does need high-resolution CT. My only other suggestion could be that she can see Dr. Marchelle Gearing and take his opinion.

## 2017-04-12 NOTE — Telephone Encounter (Signed)
She has asked since the Chest xray was normal, if she may wait 3 months, to have the scan, due to financial reasons.

## 2017-04-12 NOTE — Telephone Encounter (Signed)
She is asymptomatic. It is okay for her to wait on getting high-resolution CT.

## 2017-04-12 NOTE — Telephone Encounter (Signed)
-----   Message from Pollyann Savoy, MD sent at 04/08/2017  4:10 PM EDT ----- I discussed with Dr. Marchelle Gearing. He recommends getting HRCT chest for crackles in the lung bases with h/o RA.Please, sch HRCT.

## 2017-04-12 NOTE — Telephone Encounter (Signed)
Thank you, I have called her to advise.  

## 2017-04-14 ENCOUNTER — Ambulatory Visit: Payer: Self-pay | Admitting: Rheumatology

## 2017-05-31 ENCOUNTER — Other Ambulatory Visit: Payer: Self-pay

## 2017-05-31 DIAGNOSIS — Z79899 Other long term (current) drug therapy: Secondary | ICD-10-CM

## 2017-05-31 DIAGNOSIS — E559 Vitamin D deficiency, unspecified: Secondary | ICD-10-CM

## 2017-05-31 LAB — CBC WITH DIFFERENTIAL/PLATELET
Basophils Absolute: 0 cells/uL (ref 0–200)
Basophils Relative: 0 %
Eosinophils Absolute: 104 cells/uL (ref 15–500)
Eosinophils Relative: 2 %
HCT: 37.3 % (ref 35.0–45.0)
Hemoglobin: 12.4 g/dL (ref 11.7–15.5)
Lymphocytes Relative: 38 %
Lymphs Abs: 1976 cells/uL (ref 850–3900)
MCH: 33.1 pg — ABNORMAL HIGH (ref 27.0–33.0)
MCHC: 33.2 g/dL (ref 32.0–36.0)
MCV: 99.5 fL (ref 80.0–100.0)
MPV: 11.1 fL (ref 7.5–12.5)
Monocytes Absolute: 416 cells/uL (ref 200–950)
Monocytes Relative: 8 %
Neutro Abs: 2704 cells/uL (ref 1500–7800)
Neutrophils Relative %: 52 %
Platelets: 239 10*3/uL (ref 140–400)
RBC: 3.75 MIL/uL — ABNORMAL LOW (ref 3.80–5.10)
RDW: 14.3 % (ref 11.0–15.0)
WBC: 5.2 10*3/uL (ref 3.8–10.8)

## 2017-06-01 ENCOUNTER — Telehealth: Payer: Self-pay | Admitting: Radiology

## 2017-06-01 LAB — COMPREHENSIVE METABOLIC PANEL
ALT: 9 U/L (ref 6–29)
AST: 14 U/L (ref 10–35)
Albumin: 4 g/dL (ref 3.6–5.1)
Alkaline Phosphatase: 67 U/L (ref 33–130)
BUN: 9 mg/dL (ref 7–25)
CO2: 22 mmol/L (ref 20–32)
Calcium: 9.5 mg/dL (ref 8.6–10.4)
Chloride: 102 mmol/L (ref 98–110)
Creat: 0.62 mg/dL (ref 0.50–0.99)
Glucose, Bld: 86 mg/dL (ref 65–99)
Potassium: 4.1 mmol/L (ref 3.5–5.3)
Sodium: 138 mmol/L (ref 135–146)
Total Bilirubin: 0.5 mg/dL (ref 0.2–1.2)
Total Protein: 6.9 g/dL (ref 6.1–8.1)

## 2017-06-01 LAB — VITAMIN D 25 HYDROXY (VIT D DEFICIENCY, FRACTURES): Vit D, 25-Hydroxy: 41 ng/mL (ref 30–100)

## 2017-06-01 NOTE — Telephone Encounter (Signed)
I have called patient to advise labs are normal and discuss Vitamin D no answer

## 2017-06-01 NOTE — Progress Notes (Signed)
Normal. Should stay on maint. dose

## 2017-06-01 NOTE — Telephone Encounter (Signed)
-----   Message from Pollyann Savoy, MD sent at 06/01/2017  9:17 AM EDT ----- Normal. Should stay on maint. dose

## 2017-06-06 NOTE — Telephone Encounter (Signed)
  Patient will continue to  take Vitamin D3 (1000 units/day)

## 2017-06-16 ENCOUNTER — Other Ambulatory Visit: Payer: Self-pay | Admitting: Rheumatology

## 2017-06-16 NOTE — Telephone Encounter (Signed)
Last Visit: 04/08/17 Next Visit: 08/09/17 Labs: 05/31/17 WNL  Okay to refill per Dr. Deveshwar 

## 2017-07-09 ENCOUNTER — Other Ambulatory Visit: Payer: Self-pay | Admitting: Rheumatology

## 2017-07-11 NOTE — Telephone Encounter (Signed)
Last Visit: 04/08/17 Next Visit: 08/09/17 Labs: 05/31/17 WNL  Okay to refill per Dr. Corliss Skains

## 2017-07-11 NOTE — Telephone Encounter (Signed)
Last Visit: 04/08/17 Next Visit: 08/09/17  Okay to refill per Dr. Corliss Skains

## 2017-07-27 NOTE — Progress Notes (Signed)
Office Visit Note  Patient: Heather Koch             Date of Birth: [birth year]           MRN: [id number]             PCP: Burton Apley, MD Referring: Burton Apley, MD Visit Date: 08/09/2017 Occupation: @GUAROCC @    Subjective:  Joint stiffness.   History of Present Illness: Heather Koch is a 66 y.o. female with history of sero positive rheumatoid arthritis. She states she's been doing quite well on the combination therapy. She does have some joint stiffness and discomfort off and on. She states in August she got injured while working with the weed eater. She acquired a cut on her left lower extremity. She was seen at the urgent care but did not require any stitches. She was given antibiotics, tetanus shot and antibiotic shot. She states the wound healed well without any problems. She did not have to stop her medications. She describes some discomfort in the left trapezius area.  Activities of Daily Living:  Patient reports morning stiffness for 0 minute.   Patient Denies nocturnal pain.  Difficulty dressing/grooming: Denies Difficulty climbing stairs: Denies Difficulty getting out of chair: Denies Difficulty using hands for taps, buttons, cutlery, and/or writing: Denies   Review of Systems  Constitutional: Negative for fatigue, night sweats, weight gain, weight loss and weakness.  HENT: Negative for mouth sores, trouble swallowing, trouble swallowing, mouth dryness and nose dryness.   Eyes: Negative for pain, redness, visual disturbance and dryness.  Respiratory: Negative for cough, shortness of breath and difficulty breathing.   Cardiovascular: Negative for chest pain, palpitations, hypertension, irregular heartbeat and swelling in legs/feet.  Gastrointestinal: Negative for blood in stool, constipation and diarrhea.  Endocrine: Negative for increased urination.  Genitourinary: Negative for vaginal dryness.  Musculoskeletal: Positive for arthralgias and joint pain. Negative  for joint swelling, myalgias, muscle weakness, morning stiffness, muscle tenderness and myalgias.  Skin: Negative for color change, rash, hair loss, skin tightness, ulcers and sensitivity to sunlight.  Allergic/Immunologic: Negative for susceptible to infections.  Neurological: Negative for dizziness, memory loss and night sweats.  Hematological: Negative for swollen glands.  Psychiatric/Behavioral: Negative for depressed mood and sleep disturbance. The patient is not nervous/anxious.     PMFS History:  Patient Active Problem List   Diagnosis Date Noted  . Abnormal lung sounds 04/12/2017  . Rheumatoid arthritis involving multiple sites with positive rheumatoid factor (HCC) 04/07/2017  . High risk medication use 04/07/2017  . Primary osteoarthritis of both hands 04/07/2017  . Vitamin D deficiency 04/07/2017  . History of Granuloma annulare 04/07/2017  . Osteopenia of multiple sites 04/07/2017  . ANXIETY 01/31/2008  . CONTACT DERMATITIS&OTHER ECZEMA DUE TO PLANTS 01/31/2008  . ALLERGIC RHINITIS 06/29/2007  . COLONIC POLYPS, HX OF 06/29/2007    Past Medical History:  Diagnosis Date  . ASCUS (atypical squamous cells of undetermined significance) on Pap smear 12/2008   neg HR HPV  . LGSIL (low grade squamous intraepithelial dysplasia) 2005/2006  . Lichen sclerosus 2012  . Rheumatoid arthritis(714.0) 2012    Family History  Problem Relation Age of Onset  . Hypertension Mother   . Diabetes Mother   . Cancer Father        liver  . Heart disease Sister   . Heart disease Brother   . Cancer Brother        pancreatic cancer  . Cancer Maternal Aunt  throat  . Heart disease Sister   . Heart disease Sister   . Cancer Maternal Uncle        Colon   Past Surgical History:  Procedure Laterality Date  . TUBAL LIGATION  1997   Social History   Social History Narrative  . No narrative on file     Objective: Vital Signs: BP (!) 142/69 (BP Location: Left Arm, Patient  Position: Sitting, Cuff Size: Large)   Pulse 75   Resp 14   Ht 5\' 4"  (1.626 m)   Wt 153 lb (69.4 kg)   LMP 03/31/2003   BMI 26.26 kg/m    Physical Exam  Constitutional: She is oriented to person, place, and time. She appears well-developed and well-nourished.  HENT:  Head: Normocephalic and atraumatic.  Eyes: Conjunctivae and EOM are normal.  Neck: Normal range of motion.  Cardiovascular: Normal rate, regular rhythm, normal heart sounds and intact distal pulses.   Pulmonary/Chest: Effort normal and breath sounds normal.  Abdominal: Soft. Bowel sounds are normal.  Lymphadenopathy:    She has no cervical adenopathy.  Neurological: She is alert and oriented to person, place, and time.  Skin: Skin is warm and dry. Capillary refill takes less than 2 seconds.  Nodulosis noted on her hands and feet.  Psychiatric: She has a normal mood and affect. Her behavior is normal.  Nursing note and vitals reviewed.    Musculoskeletal Exam: C-spine and thoracic lumbar spine good range of motion. She had some tenderness over right gluteal region. Shoulder joints elbow joints wrist joint MCPs PIPs DIPs with good range of motion with no synovitis. She is some thickening over her right second MCP joint. Hip joints knee joints ankles MTPs PIPs DIPs with good range of motion.  CDAI Exam: CDAI Homunculus Exam:   Joint Counts:  CDAI Tender Joint count: 0 CDAI Swollen Joint count: 0  Global Assessments:  Patient Global Assessment: 3 Provider Global Assessment: 1  CDAI Calculated Score: 4    Investigation: No additional findings. CBC Latest Ref Rng & Units 05/31/2017 02/22/2017 11/17/2016  WBC 3.8 - 10.8 K/uL 5.2 5.3 5.6  Hemoglobin 11.7 - 15.5 g/dL 40.9 81.1 91.4  Hematocrit 35.0 - 45.0 % 37.3 37.1 36.0  Platelets 140 - 400 K/uL 239 224 209   CMP Latest Ref Rng & Units 05/31/2017 02/22/2017 11/17/2016  Glucose 65 - 99 mg/dL 86 92 782(N)  BUN 7 - 25 mg/dL 9 11 11   Creatinine 0.50 - 0.99 mg/dL 5.62  1.30 8.65  Sodium 135 - 146 mmol/L 138 140 139  Potassium 3.5 - 5.3 mmol/L 4.1 4.2 4.1  Chloride 98 - 110 mmol/L 102 105 102  CO2 20 - 32 mmol/L 22 25 28   Calcium 8.6 - 10.4 mg/dL 9.5 9.2 9.4  Total Protein 6.1 - 8.1 g/dL 6.9 7.0 7.0  Total Bilirubin 0.2 - 1.2 mg/dL 0.5 0.5 0.4  Alkaline Phos 33 - 130 U/L 67 55 64  AST 10 - 35 U/L 14 19 16   ALT 6 - 29 U/L 9 14 10     Imaging: No results found.  Speciality Comments: No specialty comments available.    Procedures:  No procedures performed Allergies: Patient has no known allergies.   Assessment / Plan:     Visit Diagnoses: Rheumatoid arthritis involving multiple sites with positive rheumatoid factor (HCC) - +CCP +RF -ANA. She had no synovitis on examination she had some synovial thickening. She's been tolerating her medications well. She is very active and she  does a lot of yardwork without much discomfort.  Rheumatoid nodulosis [facility]): She still have some nodules.  High risk medication use - MTX 0.8 ML subcutaneous every week, folic acid 2 mg by mouth daily and SSZ 500 mg 2 tablets by mouth twice a day. Her labs have been stable. We will check her labs again next month and then every 3 months to monitor for drug toxicity.  Primary osteoarthritis of both hands: She has mild osteoarthritis in her hands which causes a stiffness.  Osteopenia of multiple sites -  T score of -2.5 on DEXA done on December 2017, discussed Fosamax last visit. She decided not to start on Fosamax. Her vitamin D level was normal in August. She will continue calcium and vitamin D. Need for resistive exercises was also discussed.  Abnormal lung sounds - she had crackles in bilateral lung bases at the last visit. Chest x-ray was unremarkable.  Vitamin D deficiency: She is on supplement.  Other medical problems are listed as follows:  Granuloma annulare  History of colon polyps  History of anxiety    Orders: No orders of the defined types were placed in  this encounter.  No orders of the defined types were placed in this encounter.   Follow-Up Instructions: Return in about 5 months (around 01/07/2018) for Rheumatoid arthritis, Osteoarthritis.   Pollyann Savoy, MD  Note - This record has been created using Animal nutritionist.  Chart creation errors have been sought, but may not always  have been located. Such creation errors do not reflect on  the standard of medical care.

## 2017-08-09 ENCOUNTER — Encounter: Payer: Self-pay | Admitting: Rheumatology

## 2017-08-09 ENCOUNTER — Ambulatory Visit (INDEPENDENT_AMBULATORY_CARE_PROVIDER_SITE_OTHER): Payer: Medicare HMO | Admitting: Rheumatology

## 2017-08-09 VITALS — BP 142/69 | HR 75 | Resp 14 | Ht 64.0 in | Wt 153.0 lb

## 2017-08-09 DIAGNOSIS — M19041 Primary osteoarthritis, right hand: Secondary | ICD-10-CM | POA: Diagnosis not present

## 2017-08-09 DIAGNOSIS — E559 Vitamin D deficiency, unspecified: Secondary | ICD-10-CM

## 2017-08-09 DIAGNOSIS — Z8659 Personal history of other mental and behavioral disorders: Secondary | ICD-10-CM

## 2017-08-09 DIAGNOSIS — Z86010 Personal history of colonic polyps: Secondary | ICD-10-CM

## 2017-08-09 DIAGNOSIS — M8589 Other specified disorders of bone density and structure, multiple sites: Secondary | ICD-10-CM

## 2017-08-09 DIAGNOSIS — M0579 Rheumatoid arthritis with rheumatoid factor of multiple sites without organ or systems involvement: Secondary | ICD-10-CM

## 2017-08-09 DIAGNOSIS — L920 Granuloma annulare: Secondary | ICD-10-CM | POA: Diagnosis not present

## 2017-08-09 DIAGNOSIS — R0989 Other specified symptoms and signs involving the circulatory and respiratory systems: Secondary | ICD-10-CM | POA: Diagnosis not present

## 2017-08-09 DIAGNOSIS — M0630 Rheumatoid nodule, unspecified site: Secondary | ICD-10-CM | POA: Diagnosis not present

## 2017-08-09 DIAGNOSIS — M19042 Primary osteoarthritis, left hand: Secondary | ICD-10-CM

## 2017-08-09 DIAGNOSIS — Z79899 Other long term (current) drug therapy: Secondary | ICD-10-CM | POA: Diagnosis not present

## 2017-08-09 NOTE — Patient Instructions (Signed)
Standing Labs We placed an order today for your standing lab work.    Please come back and get your standing labs in November and every 3 months  We have open lab Monday through Friday from 8:30-11:30 AM and 1:30-4 PM at the office of Dr. Kruze Atchley.   The office is located at [address], [address], [place], [place] [address] No appointment is necessary.   Labs are drawn by [facility].  You may receive a bill from [facility] for your lab work. If you have any questions regarding directions or hours of operation,  please call [phone].    

## 2017-08-12 ENCOUNTER — Encounter: Payer: Self-pay | Admitting: Gynecology

## 2017-09-04 ENCOUNTER — Other Ambulatory Visit: Payer: Self-pay | Admitting: Rheumatology

## 2017-09-05 NOTE — Telephone Encounter (Addendum)
Last Visit: 08/09/17 Next Visit: 01/19/18 Labs: 05/31/17 WNL  Patient is coming to update labs tomorrow.   Okay to refill per Dr. Corliss Skains

## 2017-09-06 ENCOUNTER — Other Ambulatory Visit: Payer: Self-pay | Admitting: *Deleted

## 2017-09-06 ENCOUNTER — Other Ambulatory Visit: Payer: Self-pay | Admitting: Rheumatology

## 2017-09-06 ENCOUNTER — Other Ambulatory Visit: Payer: Self-pay

## 2017-09-06 DIAGNOSIS — Z79899 Other long term (current) drug therapy: Secondary | ICD-10-CM

## 2017-09-06 LAB — CBC WITH DIFFERENTIAL/PLATELET
Basophils Absolute: 41 cells/uL (ref 0–200)
Basophils Relative: 0.7 %
Eosinophils Absolute: 87 cells/uL (ref 15–500)
Eosinophils Relative: 1.5 %
HCT: 35.1 % (ref 35.0–45.0)
Hemoglobin: 12.1 g/dL (ref 11.7–15.5)
Lymphs Abs: 2129 cells/uL (ref 850–3900)
MCH: 33.1 pg — ABNORMAL HIGH (ref 27.0–33.0)
MCHC: 34.5 g/dL (ref 32.0–36.0)
MCV: 95.9 fL (ref 80.0–100.0)
MPV: 11.6 fL (ref 7.5–12.5)
Monocytes Relative: 9.6 %
Neutro Abs: 2987 cells/uL (ref 1500–7800)
Neutrophils Relative %: 51.5 %
Platelets: 223 10*3/uL (ref 140–400)
RBC: 3.66 10*6/uL — ABNORMAL LOW (ref 3.80–5.10)
RDW: 13.5 % (ref 11.0–15.0)
Total Lymphocyte: 36.7 %
WBC mixed population: 557 cells/uL (ref 200–950)
WBC: 5.8 10*3/uL (ref 3.8–10.8)

## 2017-09-06 LAB — COMPLETE METABOLIC PANEL WITH GFR
AG Ratio: 1.4 (calc) (ref 1.0–2.5)
ALT: 11 U/L (ref 6–29)
AST: 15 U/L (ref 10–35)
Albumin: 4 g/dL (ref 3.6–5.1)
Alkaline phosphatase (APISO): 65 U/L (ref 33–130)
BUN: 11 mg/dL (ref 7–25)
CO2: 31 mmol/L (ref 20–32)
Calcium: 9.7 mg/dL (ref 8.6–10.4)
Chloride: 104 mmol/L (ref 98–110)
Creat: 0.65 mg/dL (ref 0.50–0.99)
GFR, Est African American: 108 mL/min/{1.73_m2} (ref >=60)
GFR, Est Non African American: 93 mL/min/{1.73_m2} (ref >=60)
Globulin: 2.9 g/dL (calc) (ref 1.9–3.7)
Glucose, Bld: 104 mg/dL — ABNORMAL HIGH (ref 65–99)
Potassium: 4.2 mmol/L (ref 3.5–5.3)
Sodium: 141 mmol/L (ref 135–146)
Total Bilirubin: 0.3 mg/dL (ref 0.2–1.2)
Total Protein: 6.9 g/dL (ref 6.1–8.1)

## 2017-09-07 ENCOUNTER — Telehealth (INDEPENDENT_AMBULATORY_CARE_PROVIDER_SITE_OTHER): Payer: Self-pay

## 2017-09-07 MED ORDER — METHOTREXATE SODIUM CHEMO INJECTION 50 MG/2ML: 20.0000 mg | INTRAMUSCULAR | 0 refills | Status: DC

## 2017-09-07 NOTE — Telephone Encounter (Signed)
Last Visit: 08/09/17 Next Visit: 01/19/18 Labs: 09/06/17 Stable   Okay to refill per Dr. Corliss Skains

## 2017-09-07 NOTE — Telephone Encounter (Signed)
Patient calling for a Rx refill on her MTX.  CB# is [phone].  Please advise.  Thank you.

## 2017-09-07 NOTE — Telephone Encounter (Signed)
Prescription sent in today. 

## 2017-09-15 ENCOUNTER — Other Ambulatory Visit: Payer: Self-pay | Admitting: Rheumatology

## 2017-09-15 MED ORDER — "TUBERCULIN-ALLERGY SYRINGES 27G X 1/2"" 1 ML KIT": PACK | 3 refills | Status: DC

## 2017-09-15 NOTE — Telephone Encounter (Signed)
Last Visit: 08/09/17 Next Visit: 01/19/18 Labs: 09/06/17 Stable  Okay to refill per Dr. Corliss Skains  Prescription for MTX was sent in 09/07/17. WIll only send RX for syringes.

## 2017-10-19 ENCOUNTER — Telehealth: Payer: Self-pay | Admitting: Rheumatology

## 2017-10-19 MED ORDER — SULFASALAZINE 500 MG PO TBEC: 1000.0000 mg | DELAYED_RELEASE_TABLET | Freq: Two times a day (BID) | ORAL | 0 refills | Status: DC

## 2017-10-19 NOTE — Telephone Encounter (Signed)
Last Visit: 08/09/17 Next Visit: 01/19/18 Labs: 09/06/17 stable  Okay to refill per Dr. Corliss Skains

## 2017-10-19 NOTE — Telephone Encounter (Signed)
Patient called to change her pharmacy to have her prescription of Sulfasalazine be sent to [facility] at [facility] on [address].  CB [phone]

## 2017-11-04 ENCOUNTER — Other Ambulatory Visit: Payer: Self-pay | Admitting: Rheumatology

## 2017-11-04 NOTE — Telephone Encounter (Signed)
Last Visit: 08/09/17 Next Visit: 01/19/18 Labs: 09/06/17 Stable   Okay to refill per Dr. Deveshwar 

## 2017-12-12 ENCOUNTER — Other Ambulatory Visit: Payer: Self-pay | Admitting: Rheumatology

## 2017-12-13 ENCOUNTER — Other Ambulatory Visit: Payer: Self-pay | Admitting: *Deleted

## 2017-12-13 ENCOUNTER — Other Ambulatory Visit: Payer: Self-pay

## 2017-12-13 DIAGNOSIS — Z79899 Other long term (current) drug therapy: Secondary | ICD-10-CM

## 2017-12-13 LAB — CBC WITH DIFFERENTIAL/PLATELET
Basophils Absolute: 28 cells/uL (ref 0–200)
Basophils Relative: 0.6 %
Eosinophils Absolute: 60 cells/uL (ref 15–500)
Eosinophils Relative: 1.3 %
HCT: 37.6 % (ref 35.0–45.0)
Hemoglobin: 12.6 g/dL (ref 11.7–15.5)
Lymphs Abs: 1628 cells/uL (ref 850–3900)
MCH: 32.7 pg (ref 27.0–33.0)
MCHC: 33.5 g/dL (ref 32.0–36.0)
MCV: 97.7 fL (ref 80.0–100.0)
MPV: 11.6 fL (ref 7.5–12.5)
Monocytes Relative: 8.4 %
Neutro Abs: 2498 cells/uL (ref 1500–7800)
Neutrophils Relative %: 54.3 %
Platelets: 210 10*3/uL (ref 140–400)
RBC: 3.85 10*6/uL (ref 3.80–5.10)
RDW: 13.1 % (ref 11.0–15.0)
Total Lymphocyte: 35.4 %
WBC mixed population: 386 cells/uL (ref 200–950)
WBC: 4.6 10*3/uL (ref 3.8–10.8)

## 2017-12-13 LAB — COMPLETE METABOLIC PANEL WITH GFR
AG Ratio: 1.4 (calc) (ref 1.0–2.5)
ALT: 11 U/L (ref 6–29)
AST: 17 U/L (ref 10–35)
Albumin: 4.3 g/dL (ref 3.6–5.1)
Alkaline phosphatase (APISO): 63 U/L (ref 33–130)
BUN: 13 mg/dL (ref 7–25)
CO2: 27 mmol/L (ref 20–32)
Calcium: 9.9 mg/dL (ref 8.6–10.4)
Chloride: 104 mmol/L (ref 98–110)
Creat: 0.68 mg/dL (ref 0.50–0.99)
GFR, Est African American: 106 mL/min/{1.73_m2} (ref >=60)
GFR, Est Non African American: 92 mL/min/{1.73_m2} (ref >=60)
Globulin: 3 g/dL (calc) (ref 1.9–3.7)
Glucose, Bld: 98 mg/dL (ref 65–99)
Potassium: 4.4 mmol/L (ref 3.5–5.3)
Sodium: 140 mmol/L (ref 135–146)
Total Bilirubin: 0.6 mg/dL (ref 0.2–1.2)
Total Protein: 7.3 g/dL (ref 6.1–8.1)

## 2017-12-13 NOTE — Telephone Encounter (Signed)
Last Visit: 08/09/17 Next Visit: 01/19/18  Okay to refill per Dr. Corliss Skains

## 2017-12-14 NOTE — Progress Notes (Signed)
WNLs

## 2017-12-27 ENCOUNTER — Encounter: Payer: Self-pay | Admitting: Women's Health

## 2017-12-27 ENCOUNTER — Ambulatory Visit: Payer: Medicare HMO | Admitting: Women's Health

## 2017-12-27 VITALS — BP 126/80 | Ht 64.0 in | Wt 152.0 lb

## 2017-12-27 DIAGNOSIS — Z01419 Encounter for gynecological examination (general) (routine) without abnormal findings: Secondary | ICD-10-CM

## 2017-12-27 NOTE — Progress Notes (Signed)
Heather Koch February 11, 1951 [id number]    History:    Presents for breast and pelvic exam. Postmenopausal on no HRT with no bleeding. 2006 LGSIL, normal after. Normal  mammogram history. Osteopenia rheumatologist manages. Because problem is osteoarthritis and rheumatoid arthritis. 201 5 negative colon polyps has 5 year follow-up. History of lichen sclerosis. Has had Pneumovax not Zostavax due to being on methotrexate.  Past medical history, past surgical history, family history and social history were all reviewed and documented in the EPIC chart.   ROS:  A ROS was performed and pertinent positives and negatives are included.  Exam:  Vitals:   12/27/17 1417  BP: 126/80  Weight: 152 lb (68.9 kg)  Height: 5\' 4"  (1.626 m)   Body mass index is 26.09 kg/m.   General appearance:  Normal Thyroid:  Symmetrical, normal in size, without palpable masses or nodularity. Respiratory  Auscultation:  Clear without wheezing or rhonchi Cardiovascular  Auscultation:  Regular rate, without rubs, murmurs or gallops  Edema/varicosities:  Not grossly evident Abdominal  Soft,nontender, without masses, guarding or rebound.  Liver/spleen:  No organomegaly noted  Hernia:  None appreciated  Skin  Inspection:  Grossly normal   Breasts: Examined lying and sitting.     Right: Without masses, retractions, discharge or axillary adenopathy.     Left: Without masses, retractions, discharge or axillary adenopathy. Gentitourinary   Inguinal/mons:  Normal without inguinal adenopathy  External genitalia:  Lichen sclerosis at clitoral hood  BUS/Urethra/Skene's glands:  Normal  Vagina:  trophic  Cervix:  Normal  Uterus:   normal in size, shape and contour.  Midline and mobile  Adnexa/parametria:     Rt: Without masses or tenderness.   Lt: Without masses or tenderness.  Anus and perineum: Normal  Digital rectal exam: Normal sphincter tone without palpated masses or tenderness  Assessment/Plan:  67 y.o. MWF G3 P3  for breast and pelvic exam with no complaints.  Postmenopausal/no HRT/no bleeding Osteopenia-rheumatologist manages Rheumatoid arthritis and rheumatoid arthritis denies rheumatologist on methotrexate Lichen sclerosis - asymptomatic 2006 LGSIL, normal after Primary care manages labs  Plan: SBE's, continue annual screening mammogram, calcium rich diet, vitamin D 2000 daily encouraged. Home safety and fall prevention and importance of weightbearing exercise discussed. Repeat DEXA with rheumatologist.2018 Pap normal with negative HR HPV, will repeat next year. Will discuss with rheumatologist  Shingrex.     2007 [facility], 3:34 PM 12/27/2017

## 2017-12-27 NOTE — Patient Instructions (Signed)
Osteoarthritis Osteoarthritis is a type of arthritis that affects tissue that covers the ends of bones in joints (cartilage). Cartilage acts as a cushion between the bones and helps them move smoothly. Osteoarthritis results when cartilage in the joints gets worn down. Osteoarthritis is sometimes called "wear and tear" arthritis. Osteoarthritis is the most common form of arthritis. It often occurs in older people. It is a condition that gets worse over time (a progressive condition). Joints that are most often affected by this condition are in:  Fingers.  Toes.  Hips.  Knees.  Spine, including neck and lower back.  What are the causes? This condition is caused by age-related wearing down of cartilage that covers the ends of bones. What increases the risk? The following factors may make you more likely to develop this condition:  Older age.  Being overweight or obese.  Overuse of joints, such as in athletes.  Past injury of a joint.  Past surgery on a joint.  Family history of osteoarthritis.  What are the signs or symptoms? The main symptoms of this condition are pain, swelling, and stiffness in the joint. The joint may lose its shape over time. Small pieces of bone or cartilage may break off and float inside of the joint, which may cause more pain and damage to the joint. Small deposits of bone (osteophytes) may grow on the edges of the joint. Other symptoms may include:  A grating or scraping feeling inside the joint when you move it.  Popping or creaking sounds when you move.  Symptoms may affect one or more joints. Osteoarthritis in a major joint, such as your knee or hip, can make it painful to walk or exercise. If you have osteoarthritis in your hands, you might not be able to grip items, twist your hand, or control small movements of your hands and fingers (fine motor skills). How is this diagnosed? This condition may be diagnosed based on:  Your medical history.  A  physical exam.  Your symptoms.  X-rays of the affected joint(s).  Blood tests to rule out other types of arthritis.  How is this treated? There is no cure for this condition, but treatment can help to control pain and improve joint function. Treatment plans may include:  A prescribed exercise program that allows for rest and joint relief. You may work with a physical therapist.  A weight control plan.  Pain relief techniques, such as: ? Applying heat and cold to the joint. ? Electric pulses delivered to nerve endings under the skin (transcutaneous electrical nerve stimulation, or TENS). ? Massage. ? Certain nutritional supplements.  NSAIDs or prescription medicines to help relieve pain.  Medicine to help relieve pain and inflammation (corticosteroids). This can be given by mouth (orally) or as an injection.  Assistive devices, such as a brace, wrap, splint, specialized glove, or cane.  Surgery, such as: ? An osteotomy. This is done to reposition the bones and relieve pain or to remove loose pieces of bone and cartilage. ? Joint replacement surgery. You may need this surgery if you have very bad (advanced) osteoarthritis.  Follow these instructions at home: Activity  Rest your affected joints as directed by your health care provider.  Do not drive or use heavy machinery while taking prescription pain medicine.  Exercise as directed. Your health care provider or physical therapist may recommend specific types of exercise, such as: ? Strengthening exercises. These are done to strengthen the muscles that support joints that are affected by arthritis.  They can be performed with weights or with exercise bands to add resistance. ? Aerobic activities. These are exercises, such as brisk walking or water aerobics, that get your heart pumping. ? Range-of-motion activities. These keep your joints easy to move. ? Balance and agility exercises. Managing pain, stiffness, and  swelling  If directed, apply heat to the affected area as often as told by your health care provider. Use the heat source that your health care provider recommends, such as a moist heat pack or a heating pad. ? If you have a removable assistive device, remove it as told by your health care provider. ? Place a towel between your skin and the heat source. If your health care provider tells you to keep the assistive device on while you apply heat, place a towel between the assistive device and the heat source. ? Leave the heat on for 20-30 minutes. ? Remove the heat if your skin turns bright red. This is especially important if you are unable to feel pain, heat, or cold. You may have a greater risk of getting burned.  If directed, put ice on the affected joint: ? If you have a removable assistive device, remove it as told by your health care provider. ? Put ice in a plastic bag. ? Place a towel between your skin and the bag. If your health care provider tells you to keep the assistive device on during icing, place a towel between the assistive device and the bag. ? Leave the ice on for 20 minutes, 2-3 times a day. General instructions  Take over-the-counter and prescription medicines only as told by your health care provider.  Maintain a healthy weight. Follow instructions from your health care provider for weight control. These may include dietary restrictions.  Do not use any products that contain nicotine or tobacco, such as cigarettes and e-cigarettes. These can delay bone healing. If you need help quitting, ask your health care provider.  Use assistive devices as directed by your health care provider.  Keep all follow-up visits as told by your health care provider. This is important. Where to find more information:  [employer] of Arthritis and Musculoskeletal and Skin Diseases: www.niams.SouthExposed.es  [employer] on Aging: http://kim-miller.com/  American College of Rheumatology:  www.rheumatology.org Contact a health care provider if:  Your skin turns red.  You develop a rash.  You have pain that gets worse.  You have a fever along with joint or muscle aches. Get help right away if:  You lose a lot of weight.  You suddenly lose your appetite.  You have night sweats. Summary  Osteoarthritis is a type of arthritis that affects tissue covering the ends of bones in joints (cartilage).  This condition is caused by age-related wearing down of cartilage that covers the ends of bones.  The main symptom of this condition is pain, swelling, and stiffness in the joint.  There is no cure for this condition, but treatment can help to control pain and improve joint function. This information is not intended to replace advice given to you by your health care provider. Make sure you discuss any questions you have with your health care provider. Document Released: 10/04/2005 Document Revised: 06/07/2016 Document Reviewed: 06/07/2016 Elsevier Interactive Patient Education  2018 [employer] Maintenance for Postmenopausal Women Menopause is a normal process in which your reproductive ability comes to an end. This process happens gradually over a span of months to years, usually between the ages of 78 and 62.  Menopause is complete when you have missed 12 consecutive menstrual periods. It is important to talk with your health care provider about some of the most common conditions that affect postmenopausal women, such as heart disease, cancer, and bone loss (osteoporosis). Adopting a healthy lifestyle and getting preventive care can help to promote your health and wellness. Those actions can also lower your chances of developing some of these common conditions. What should I know about menopause? During menopause, you may experience a number of symptoms, such as:  Moderate-to-severe hot flashes.  Night sweats.  Decrease in sex drive.  Mood  swings.  Headaches.  Tiredness.  Irritability.  Memory problems.  Insomnia.  Choosing to treat or not to treat menopausal changes is an individual decision that you make with your health care provider. What should I know about hormone replacement therapy and supplements? Hormone therapy products are effective for treating symptoms that are associated with menopause, such as hot flashes and night sweats. Hormone replacement carries certain risks, especially as you become older. If you are thinking about using estrogen or estrogen with progestin treatments, discuss the benefits and risks with your health care provider. What should I know about heart disease and stroke? Heart disease, heart attack, and stroke become more likely as you age. This may be due, in part, to the hormonal changes that your body experiences during menopause. These can affect how your body processes dietary fats, triglycerides, and cholesterol. Heart attack and stroke are both medical emergencies. There are many things that you can do to help prevent heart disease and stroke:  Have your blood pressure checked at least every 1-2 years. High blood pressure causes heart disease and increases the risk of stroke.  If you are 68-8 years old, ask your health care provider if you should take aspirin to prevent a heart attack or a stroke.  Do not use any tobacco products, including cigarettes, chewing tobacco, or electronic cigarettes. If you need help quitting, ask your health care provider.  It is important to eat a healthy diet and maintain a healthy weight. ? Be sure to include plenty of vegetables, fruits, low-fat dairy products, and lean protein. ? Avoid eating foods that are high in solid fats, added sugars, or salt (sodium).  Get regular exercise. This is one of the most important things that you can do for your health. ? Try to exercise for at least 150 minutes each week. The type of exercise that you do should  increase your heart rate and make you sweat. This is known as moderate-intensity exercise. ? Try to do strengthening exercises at least twice each week. Do these in addition to the moderate-intensity exercise.  Know your numbers.Ask your health care provider to check your cholesterol and your blood glucose. Continue to have your blood tested as directed by your health care provider.  What should I know about cancer screening? There are several types of cancer. Take the following steps to reduce your risk and to catch any cancer development as early as possible. Breast Cancer  Practice breast self-awareness. ? This means understanding how your breasts normally appear and feel. ? It also means doing regular breast self-exams. Let your health care provider know about any changes, no matter how small.  If you are 75 or older, have a clinician do a breast exam (clinical breast exam or CBE) every year. Depending on your age, family history, and medical history, it may be recommended that you also have a yearly breast X-ray (  mammogram).  If you have a family history of breast cancer, talk with your health care provider about genetic screening.  If you are at high risk for breast cancer, talk with your health care provider about having an MRI and a mammogram every year.  Breast cancer (BRCA) gene test is recommended for women who have family members with BRCA-related cancers. Results of the assessment will determine the need for genetic counseling and BRCA1 and for BRCA2 testing. BRCA-related cancers include these types: ? Breast. This occurs in males or females. ? Ovarian. ? Tubal. This may also be called fallopian tube cancer. ? Cancer of the abdominal or pelvic lining (peritoneal cancer). ? Prostate. ? Pancreatic.  Cervical, Uterine, and Ovarian Cancer Your health care provider may recommend that you be screened regularly for cancer of the pelvic organs. These include your ovaries, uterus,  and vagina. This screening involves a pelvic exam, which includes checking for microscopic changes to the surface of your cervix (Pap test).  For women ages 21-65, health care providers may recommend a pelvic exam and a Pap test every three years. For women ages 63-65, they may recommend the Pap test and pelvic exam, combined with testing for human papilloma virus (HPV), every five years. Some types of HPV increase your risk of cervical cancer. Testing for HPV may also be done on women of any age who have unclear Pap test results.  Other health care providers may not recommend any screening for nonpregnant women who are considered low risk for pelvic cancer and have no symptoms. Ask your health care provider if a screening pelvic exam is right for you.  If you have had past treatment for cervical cancer or a condition that could lead to cancer, you need Pap tests and screening for cancer for at least 20 years after your treatment. If Pap tests have been discontinued for you, your risk factors (such as having a new sexual partner) need to be reassessed to determine if you should start having screenings again. Some women have medical problems that increase the chance of getting cervical cancer. In these cases, your health care provider may recommend that you have screening and Pap tests more often.  If you have a family history of uterine cancer or ovarian cancer, talk with your health care provider about genetic screening.  If you have vaginal bleeding after reaching menopause, tell your health care provider.  There are currently no reliable tests available to screen for ovarian cancer.  Lung Cancer Lung cancer screening is recommended for adults 48-45 years old who are at high risk for lung cancer because of a history of smoking. A yearly low-dose CT scan of the lungs is recommended if you:  Currently smoke.  Have a history of at least 30 pack-years of smoking and you currently smoke or have quit  within the past 15 years. A pack-year is smoking an average of one pack of cigarettes per day for one year.  Yearly screening should:  Continue until it has been 15 years since you quit.  Stop if you develop a health problem that would prevent you from having lung cancer treatment.  Colorectal Cancer  This type of cancer can be detected and can often be prevented.  Routine colorectal cancer screening usually begins at age 42 and continues through age 20.  If you have risk factors for colon cancer, your health care provider may recommend that you be screened at an earlier age.  If you have a family history  of colorectal cancer, talk with your health care provider about genetic screening.  Your health care provider may also recommend using home test kits to check for hidden blood in your stool.  A small camera at the end of a tube can be used to examine your colon directly (sigmoidoscopy or colonoscopy). This is done to check for the earliest forms of colorectal cancer.  Direct examination of the colon should be repeated every 5-10 years until age 45. However, if early forms of precancerous polyps or small growths are found or if you have a family history or genetic risk for colorectal cancer, you may need to be screened more often.  Skin Cancer  Check your skin from head to toe regularly.  Monitor any moles. Be sure to tell your health care provider: ? About any new moles or changes in moles, especially if there is a change in a mole's shape or color. ? If you have a mole that is larger than the size of a pencil eraser.  If any of your family members has a history of skin cancer, especially at a young age, talk with your health care provider about genetic screening.  Always use sunscreen. Apply sunscreen liberally and repeatedly throughout the day.  Whenever you are outside, protect yourself by wearing long sleeves, pants, a wide-brimmed hat, and sunglasses.  What should I know  about osteoporosis? Osteoporosis is a condition in which bone destruction happens more quickly than new bone creation. After menopause, you may be at an increased risk for osteoporosis. To help prevent osteoporosis or the bone fractures that can happen because of osteoporosis, the following is recommended:  If you are 56-36 years old, get at least 1,000 mg of calcium and at least 600 mg of vitamin D per day.  If you are older than age 90 but younger than age 32, get at least 1,200 mg of calcium and at least 600 mg of vitamin D per day.  If you are older than age 38, get at least 1,200 mg of calcium and at least 800 mg of vitamin D per day.  Smoking and excessive alcohol intake increase the risk of osteoporosis. Eat foods that are rich in calcium and vitamin D, and do weight-bearing exercises several times each week as directed by your health care provider. What should I know about how menopause affects my mental health? Depression may occur at any age, but it is more common as you become older. Common symptoms of depression include:  Low or sad mood.  Changes in sleep patterns.  Changes in appetite or eating patterns.  Feeling an overall lack of motivation or enjoyment of activities that you previously enjoyed.  Frequent crying spells.  Talk with your health care provider if you think that you are experiencing depression. What should I know about immunizations? It is important that you get and maintain your immunizations. These include:  Tetanus, diphtheria, and pertussis (Tdap) booster vaccine.  Influenza every year before the flu season begins.  Pneumonia vaccine.  Shingles vaccine.  Your health care provider may also recommend other immunizations. This information is not intended to replace advice given to you by your health care provider. Make sure you discuss any questions you have with your health care provider. Document Released: 11/26/2005 Document Revised: 04/23/2016  Document Reviewed: 07/08/2015 Elsevier Interactive Patient Education  2018 [employer].

## 2017-12-29 NOTE — Addendum Note (Signed)
Addended by: Richardson Chiquito on: 12/29/2017 09:09 AM   Modules accepted: Level of Service

## 2018-01-02 ENCOUNTER — Other Ambulatory Visit: Payer: Self-pay | Admitting: Rheumatology

## 2018-01-02 NOTE — Telephone Encounter (Signed)
Last Visit: 08/09/17 Next Visit: 01/19/18 Labs: 12/13/17 WNL  Okay to refill per Dr. Deveshwar  

## 2018-01-09 NOTE — Progress Notes (Signed)
Office Visit Note  Patient: Heather Koch             Date of Birth: 1951/08/22           MRN: [id number]             PCP: Burton Apley, MD Referring: Burton Apley, MD Visit Date: 01/19/2018 Occupation: @GUAROCC @    Subjective:  Bilateral hand pain    History of Present Illness: [name] is a 67 y.o. female with history of seropositive rheumatoid arthritis, osteoarthritis, and osteopenia.  Patient states that she continues to be on sulfasalazine 500 mg 2 tablets by mouth twice a day, Methotrexate 0.8 mL weekly and folic acid 2 mg daily.  She denies missing any doses.  She states that over the past 2 weeks she has been having increased pain and swelling in her bilateral hands.  She states that she finds that this time of year she always has increased pain.  She says she is also been having increased joint stiffness.  States she continues to have some nodules on her hands and a nodule on her left foot.  She states these nodules are nontender. She states that she developed a bump on the anterior surface of her right shin.  She states that she has been elevating her right leg.  She denies any injuries or falls.  She states that she has some pain with positional changes.  She states that she is seeing her PCP next week. She states that she has been having pain on the plantar aspect of her left foot and thinks that she has developed plantar fasciitis.  She states that she has been performing stretching exercises at home which have been helping minimally.   Activities of Daily Living:  Patient reports morning stiffness for 15-20 minutes.   Patient Denies nocturnal pain.  Difficulty dressing/grooming: Denies Difficulty climbing stairs: Denies Difficulty getting out of chair: Denies Difficulty using hands for taps, buttons, cutlery, and/or writing: Reports   Review of Systems  Constitutional: Negative for fatigue.  HENT: Negative for mouth sores, mouth dryness and nose dryness.     Eyes: Negative for pain, visual disturbance and dryness.  Respiratory: Negative for cough, hemoptysis, shortness of breath and difficulty breathing.   Cardiovascular: Negative for chest pain, palpitations, hypertension and swelling in legs/feet.  Gastrointestinal: Negative for abdominal pain, blood in stool, constipation, diarrhea and nausea.  Endocrine: Negative for increased urination.  Genitourinary: Negative for painful urination and pelvic pain.  Musculoskeletal: Positive for arthralgias, joint pain, joint swelling and morning stiffness. Negative for myalgias, muscle weakness, muscle tenderness and myalgias.  Skin: Positive for hair loss. Negative for color change, pallor, rash, nodules/bumps, skin tightness, ulcers and sensitivity to sunlight.  Allergic/Immunologic: Negative for susceptible to infections.  Neurological: Positive for headaches. Negative for numbness, memory loss and weakness.  Hematological: Negative for bruising/bleeding tendency and swollen glands.  Psychiatric/Behavioral: Negative for depressed mood, confusion and sleep disturbance. The patient is not nervous/anxious.     PMFS History:  Patient Active Problem List   Diagnosis Date Noted  . Abnormal lung sounds 04/12/2017  . Rheumatoid arthritis involving multiple sites with positive rheumatoid factor (HCC) 04/07/2017  . High risk medication use 04/07/2017  . Primary osteoarthritis of both hands 04/07/2017  . Vitamin D deficiency 04/07/2017  . History of Granuloma annulare 04/07/2017  . Osteopenia of multiple sites 04/07/2017  . ANXIETY 01/31/2008  . CONTACT DERMATITIS&OTHER ECZEMA DUE TO PLANTS 01/31/2008  . ALLERGIC RHINITIS 06/29/2007  .  COLONIC POLYPS, HX OF 06/29/2007    Past Medical History:  Diagnosis Date  . ASCUS (atypical squamous cells of undetermined significance) on Pap smear 12/2008   neg HR HPV  . LGSIL (low grade squamous intraepithelial dysplasia) 2005/2006  . Lichen sclerosus 2012  .  Rheumatoid arthritis(714.0) 2012    Family History  Problem Relation Age of Onset  . Hypertension Mother   . Diabetes Mother   . Cancer Father        liver  . Heart disease Sister   . Cancer Brother        pancreatic cancer  . Cancer Maternal Aunt        throat  . Heart disease Sister   . Heart disease Sister   . Cancer Maternal Uncle        Colon  . Healthy Son   . Healthy Son   . Healthy Daughter    Past Surgical History:  Procedure Laterality Date  . TUBAL LIGATION  1997   Social History   Social History Narrative  . Not on file     Objective: Vital Signs: BP 121/71 (BP Location: Right Arm, Patient Position: Sitting, Cuff Size: Normal)   Pulse 86   Resp 14   Ht 5\' 4"  (1.626 m)   Wt 150 lb (68 kg)   LMP 03/31/2003   BMI 25.75 kg/m    Physical Exam  Constitutional: She is oriented to person, place, and time. She appears well-developed and well-nourished.  HENT:  Head: Normocephalic and atraumatic.  Eyes: Conjunctivae and EOM are normal.  Neck: Normal range of motion.  Cardiovascular: Normal rate, regular rhythm, normal heart sounds and intact distal pulses.  Pulmonary/Chest: Effort normal and breath sounds normal.  Abdominal: Soft. Bowel sounds are normal.  Lymphadenopathy:    She has no cervical adenopathy.  Neurological: She is alert and oriented to person, place, and time.  Skin: Skin is warm and dry. Capillary refill takes less than 2 seconds.  Psychiatric: She has a normal mood and affect. Her behavior is normal.  Nursing note and vitals reviewed.    Musculoskeletal Exam: C-spine, thoracic spine, lumbar spine good range of motion.  No midline spinal tenderness.  No SI joint tenderness.  Shoulder joints, elbows elbow joints, wrist joints, MCPs, PIPs, DIPs good range of motion with no synovitis.  She has synovial thickening of her second MCP joints.  She has tenderness of her left second and third MCP joints.  Hip joints, knee joints, ankle joints, MTPs,  PIPs, DIPs good range of motion with no synovitis.  No warmth or effusion of bilateral knees.  No tenderness of trochanteric bursa. Nodulosis noted on her hands and feet.   CDAI Exam: CDAI Homunculus Exam:   Joint Counts:  CDAI Tender Joint count: 0 CDAI Swollen Joint count: 0  Global Assessments:  Patient Global Assessment: 6   CDAI Calculated Score: 6    Investigation: No additional findings. CBC Latest Ref Rng & Units 12/13/2017 09/06/2017 05/31/2017  WBC 3.8 - 10.8 Thousand/uL 4.6 5.8 5.2  Hemoglobin 11.7 - 15.5 g/dL 06/02/2017 29.9 24.2  Hematocrit 35.0 - 45.0 % 37.6 35.1 37.3  Platelets 140 - 400 Thousand/uL 210 223 239   CMP Latest Ref Rng & Units 12/13/2017 09/06/2017 05/31/2017  Glucose 65 - 99 mg/dL 98 06/02/2017) 86  BUN 7 - 25 mg/dL 13 11 9   Creatinine 0.50 - 0.99 mg/dL 419(Q 2.22  Sodium 135 - 146 mmol/L 140 141 138  Potassium 3.5 -  5.3 mmol/L 4.4 4.2 4.1  Chloride 98 - 110 mmol/L 104 104 102  CO2 20 - 32 mmol/L 27 31 22   Calcium 8.6 - 10.4 mg/dL 9.9 9.7 9.5  Total Protein 6.1 - 8.1 g/dL 7.3 6.9 6.9  Total Bilirubin 0.2 - 1.2 mg/dL 0.6 0.3 0.5  Alkaline Phos 33 - 130 U/L - - 67  AST 10 - 35 U/L 17 15 14   ALT 6 - 29 U/L 11 11 9     Imaging: No results found.  Speciality Comments: No specialty comments available.    Procedures:  No procedures performed Allergies: Patient has no known allergies.   Assessment / Plan:     Visit Diagnoses: Rheumatoid arthritis involving multiple sites with positive rheumatoid factor (HCC) - +CCP +RF -ANA.:  For the past 2 weeks she has been having increased pain in her bilateral hands.  She states that she has noticed some swelling especially in her left hand.  She has synovial thickening of bilateral second MCP joints.  She has not missed any of her recent doses of medications.  She will continue on methotrexate 0.8 mL weekly, folic acid 2 mg daily, and sulfasalazine 500 mg 2 tablets by mouth twice a day. She declined x-rays of her  hands and feet today.  She would like to have x-rays at her next visit.    High risk medication use - MTX 0.8 ML subcutaneous every week, folic acid 2 mg by mouth daily and SSZ 500 mg 2 tablets by mouth twice a day.  CBC and CMP were within normal limits on 12/13/2017.  She will have labs in May and every 3 months to monitor for drug toxicity.  Rheumatoid nodulosis (HCC): Nodulosis noted on hands and feet.  Pes anserine bursitis: She has tenderness of right pes anserine bursa.  She declined a cortisone injection today.    Primary osteoarthritis of both hands: She has PIP and DIP synovial thickening consistent with osteoarthritis.  Joint protection and muscle strengthening were discussed.  Osteopenia of multiple sites - T score of -2.5 on DEXA done on December 201. She previously declined starting Fosamax.  She continues to take vitamin D daily.  History of vitamin D deficiency: She is on a vitamin D supplement.  Other medical conditions are listed as follows:  History of anxiety  History of colon polyps  Granuloma annulare    Orders: No orders of the defined types were placed in this encounter.  No orders of the defined types were placed in this encounter.   Face-to-face time spent with patient was 30 minutes. >50% of time was spent in counseling and coordination of care.  Follow-Up Instructions: Return for Rheumatoid arthritis, Osteoarthritis.   12/15/2017, PA-C   I examined and evaluated the patient with June PA.  She had some synovitis on my examination over her MCPs.  She does not want to change medication regimen at this time.  I also offered x-ray of bilateral hands but she declined.  She has tenderness over right anserine bursa consistent with anserine bursitis.  I offered cortisone injection.  She was to wait until the next visit.  We will call in Voltaren gel prescription.  The plan of care was discussed as noted above.  08-31-1986, MD  Note - This  record has been created using [employer]ld[employer] Bienenstock.  Chart creation errors have been sought, but may not always  have been located. Such creation errors do not reflect on  the standard of  medical care.

## 2018-01-12 ENCOUNTER — Other Ambulatory Visit: Payer: Self-pay | Admitting: Rheumatology

## 2018-01-13 NOTE — Telephone Encounter (Signed)
Last Visit: 08/09/17 Next Visit: 01/19/18 Labs: 12/13/17 WNL  Okay to refill per Dr. Corliss Skains

## 2018-01-19 ENCOUNTER — Ambulatory Visit: Payer: Medicare HMO | Admitting: Rheumatology

## 2018-01-19 ENCOUNTER — Encounter: Payer: Self-pay | Admitting: Rheumatology

## 2018-01-19 VITALS — BP 121/71 | HR 86 | Resp 14 | Ht 64.0 in | Wt 150.0 lb

## 2018-01-19 DIAGNOSIS — M19041 Primary osteoarthritis, right hand: Secondary | ICD-10-CM | POA: Diagnosis not present

## 2018-01-19 DIAGNOSIS — M0579 Rheumatoid arthritis with rheumatoid factor of multiple sites without organ or systems involvement: Secondary | ICD-10-CM

## 2018-01-19 DIAGNOSIS — Z86010 Personal history of colonic polyps: Secondary | ICD-10-CM | POA: Diagnosis not present

## 2018-01-19 DIAGNOSIS — M8589 Other specified disorders of bone density and structure, multiple sites: Secondary | ICD-10-CM

## 2018-01-19 DIAGNOSIS — M19042 Primary osteoarthritis, left hand: Secondary | ICD-10-CM | POA: Diagnosis not present

## 2018-01-19 DIAGNOSIS — Z8639 Personal history of other endocrine, nutritional and metabolic disease: Secondary | ICD-10-CM

## 2018-01-19 DIAGNOSIS — L920 Granuloma annulare: Secondary | ICD-10-CM

## 2018-01-19 DIAGNOSIS — Z8659 Personal history of other mental and behavioral disorders: Secondary | ICD-10-CM

## 2018-01-19 DIAGNOSIS — M0630 Rheumatoid nodule, unspecified site: Secondary | ICD-10-CM | POA: Diagnosis not present

## 2018-01-19 DIAGNOSIS — M7050 Other bursitis of knee, unspecified knee: Secondary | ICD-10-CM

## 2018-01-19 DIAGNOSIS — Z79899 Other long term (current) drug therapy: Secondary | ICD-10-CM | POA: Diagnosis not present

## 2018-01-19 MED ORDER — DICLOFENAC SODIUM 1 % TD GEL: TRANSDERMAL | 1 refills | Status: AC

## 2018-01-19 NOTE — Progress Notes (Incomplete)
Office Visit Note  Patient: Heather Koch             Date of Birth: 1951/08/22           MRN: [id number]             PCP: Burton Apley, MD Referring: Burton Apley, MD Visit Date: 01/19/2018 Occupation: @GUAROCC @    Subjective:  Bilateral hand pain    History of Present Illness: Heather Koch is a 67 y.o. female with history of seropositive rheumatoid arthritis, osteoarthritis, and osteopenia.  Patient states that she continues to be on sulfasalazine 500 mg 2 tablets by mouth twice a day, Methotrexate 0.8 mL weekly and folic acid 2 mg daily.  She denies missing any doses.  She states that over the past 2 weeks she has been having increased pain and swelling in her bilateral hands.  She states that she finds that this time of year she always has increased pain.  She says she is also been having increased joint stiffness.  States she continues to have some nodules on her hands and a nodule on her left foot.  She states these nodules are nontender. She states that she developed a bump on the anterior surface of her right shin.  She states that she has been elevating her right leg.  She denies any injuries or falls.  She states that she has some pain with positional changes.  She states that she is seeing her PCP next week. She states that she has been having pain on the plantar aspect of her left foot and thinks that she has developed plantar fasciitis.  She states that she has been performing stretching exercises at home which have been helping minimally.   Activities of Daily Living:  Patient reports morning stiffness for 15-20 minutes.   Patient Denies nocturnal pain.  Difficulty dressing/grooming: Denies Difficulty climbing stairs: Denies Difficulty getting out of chair: Denies Difficulty using hands for taps, buttons, cutlery, and/or writing: Reports   Review of Systems  Constitutional: Negative for fatigue.  HENT: Negative for mouth sores, mouth dryness and nose dryness.     Eyes: Negative for pain, visual disturbance and dryness.  Respiratory: Negative for cough, hemoptysis, shortness of breath and difficulty breathing.   Cardiovascular: Negative for chest pain, palpitations, hypertension and swelling in legs/feet.  Gastrointestinal: Negative for abdominal pain, blood in stool, constipation, diarrhea and nausea.  Endocrine: Negative for increased urination.  Genitourinary: Negative for painful urination and pelvic pain.  Musculoskeletal: Positive for arthralgias, joint pain, joint swelling and morning stiffness. Negative for myalgias, muscle weakness, muscle tenderness and myalgias.  Skin: Positive for hair loss. Negative for color change, pallor, rash, nodules/bumps, skin tightness, ulcers and sensitivity to sunlight.  Allergic/Immunologic: Negative for susceptible to infections.  Neurological: Positive for headaches. Negative for numbness, memory loss and weakness.  Hematological: Negative for bruising/bleeding tendency and swollen glands.  Psychiatric/Behavioral: Negative for depressed mood, confusion and sleep disturbance. The patient is not nervous/anxious.     PMFS History:  Patient Active Problem List   Diagnosis Date Noted  . Abnormal lung sounds 04/12/2017  . Rheumatoid arthritis involving multiple sites with positive rheumatoid factor (HCC) 04/07/2017  . High risk medication use 04/07/2017  . Primary osteoarthritis of both hands 04/07/2017  . Vitamin D deficiency 04/07/2017  . History of Granuloma annulare 04/07/2017  . Osteopenia of multiple sites 04/07/2017  . ANXIETY 01/31/2008  . CONTACT DERMATITIS&OTHER ECZEMA DUE TO PLANTS 01/31/2008  . ALLERGIC RHINITIS 06/29/2007  .  COLONIC POLYPS, HX OF 06/29/2007    Past Medical History:  Diagnosis Date  . ASCUS (atypical squamous cells of undetermined significance) on Pap smear 12/2008   neg HR HPV  . LGSIL (low grade squamous intraepithelial dysplasia) 2005/2006  . Lichen sclerosus 2012  .  Rheumatoid arthritis(714.0) 2012    Family History  Problem Relation Age of Onset  . Hypertension Mother   . Diabetes Mother   . Cancer Father        liver  . Heart disease Sister   . Cancer Brother        pancreatic cancer  . Cancer Maternal Aunt        throat  . Heart disease Sister   . Heart disease Sister   . Cancer Maternal Uncle        Colon  . Healthy Son   . Healthy Son   . Healthy Daughter    Past Surgical History:  Procedure Laterality Date  . TUBAL LIGATION  1997   Social History   Social History Narrative  . Not on file     Objective: Vital Signs: BP 121/71 (BP Location: Right Arm, Patient Position: Sitting, Cuff Size: Normal)   Pulse 86   Resp 14   Ht 5\' 4"  (1.626 m)   Wt 150 lb (68 kg)   LMP 03/31/2003   BMI 25.75 kg/m    Physical Exam  Constitutional: She is oriented to person, place, and time. She appears well-developed and well-nourished.  HENT:  Head: Normocephalic and atraumatic.  Eyes: Conjunctivae and EOM are normal.  Neck: Normal range of motion.  Cardiovascular: Normal rate, regular rhythm, normal heart sounds and intact distal pulses.  Pulmonary/Chest: Effort normal and breath sounds normal.  Abdominal: Soft. Bowel sounds are normal.  Lymphadenopathy:    She has no cervical adenopathy.  Neurological: She is alert and oriented to person, place, and time.  Skin: Skin is warm and dry. Capillary refill takes less than 2 seconds.  Psychiatric: She has a normal mood and affect. Her behavior is normal.  Nursing note and vitals reviewed.    Musculoskeletal Exam: C-spine, thoracic spine, lumbar spine good range of motion.  No midline spinal tenderness.  No SI joint tenderness.  Shoulder joints, elbows elbow joints, wrist joints, MCPs, PIPs, DIPs good range of motion with no synovitis.  She has synovial thickening of her second MCP joints.  She has tenderness of her left second and third MCP joints.  Hip joints, knee joints, ankle joints, MTPs,  PIPs, DIPs good range of motion with no synovitis.  No warmth or effusion of bilateral knees.  No tenderness of trochanteric bursa. Nodulosis noted on her hands and feet.   CDAI Exam: CDAI Homunculus Exam:   Joint Counts:  CDAI Tender Joint count: 0 CDAI Swollen Joint count: 0  Global Assessments:  Patient Global Assessment: 6   CDAI Calculated Score: 6    Investigation: No additional findings. CBC Latest Ref Rng & Units 12/13/2017 09/06/2017 05/31/2017  WBC 3.8 - 10.8 Thousand/uL 4.6 5.8 5.2  Hemoglobin 11.7 - 15.5 g/dL 06/02/2017 29.9 24.2  Hematocrit 35.0 - 45.0 % 37.6 35.1 37.3  Platelets 140 - 400 Thousand/uL 210 223 239   CMP Latest Ref Rng & Units 12/13/2017 09/06/2017 05/31/2017  Glucose 65 - 99 mg/dL 98 06/02/2017) 86  BUN 7 - 25 mg/dL 13 11 9   Creatinine 0.50 - 0.99 mg/dL 419(Q 2.22  Sodium 135 - 146 mmol/L 140 141 138  Potassium 3.5 -  5.3 mmol/L 4.4 4.2 4.1  Chloride 98 - 110 mmol/L 104 104 102  CO2 20 - 32 mmol/L 27 31 22   Calcium 8.6 - 10.4 mg/dL 9.9 9.7 9.5  Total Protein 6.1 - 8.1 g/dL 7.3 6.9 6.9  Total Bilirubin 0.2 - 1.2 mg/dL 0.6 0.3 0.5  Alkaline Phos 33 - 130 U/L - - 67  AST 10 - 35 U/L 17 15 14   ALT 6 - 29 U/L 11 11 9     Imaging: No results found.  Speciality Comments: No specialty comments available.    Procedures:  No procedures performed Allergies: Patient has no known allergies.   Assessment / Plan:     Visit Diagnoses: Rheumatoid arthritis involving multiple sites with positive rheumatoid factor (HCC) - +CCP +RF -ANA.:  For the past 2 weeks she has been having increased pain in her bilateral hands.  She states that she has noticed some swelling especially in her left hand.  She has synovial thickening of bilateral second MCP joints.  She has not missed any of her recent doses of medications.  She will continue on methotrexate 0.8 mL weekly, folic acid 2 mg daily, and sulfasalazine 500 mg 2 tablets by mouth twice a day. She declined x-rays of her  hands and feet today.  She would like to have x-rays at her next visit.    High risk medication use - MTX 0.8 ML subcutaneous every week, folic acid 2 mg by mouth daily and SSZ 500 mg 2 tablets by mouth twice a day.  CBC and CMP were within normal limits on 12/13/2017.  She will have labs in May and every 3 months to monitor for drug toxicity.  Rheumatoid nodulosis (HCC): Nodulosis noted on hands and feet.  Pes anserine bursitis: She has tenderness of right pes anserine bursa.  She declined a cortisone injection today.    Primary osteoarthritis of both hands: She has PIP and DIP synovial thickening consistent with osteoarthritis.  Joint protection and muscle strengthening were discussed.  Osteopenia of multiple sites - T score of -2.5 on DEXA done on December 201. She previously declined starting Fosamax.  She continues to take vitamin D daily.  History of vitamin D deficiency: She is on a vitamin D supplement.  Other medical conditions are listed as follows:  History of anxiety  History of colon polyps  Granuloma annulare    Orders: No orders of the defined types were placed in this encounter.  Meds ordered this encounter  Medications  . diclofenac sodium (VOLTAREN) 1 % GEL    Sig: Apply 3 gm to 3 large joints up to 3 times a day.Dispense 3 tubes with 3 refills.    Dispense:  3 Tube    Refill:  1    Face-to-face time spent with patient was 30 minutes. >50% of time was spent in counseling and coordination of care.  Follow-Up Instructions: Return in about 5 months (around 06/21/2018) for Rheumatoid arthritis, Osteoarthritis.   June PA-C  I examined and evaluated the patient with 08-31-1986 PA.  She had some synovitis on my examination over her MCPs.  She does not want to change medication regimen at this time.  I also offered x-ray of bilateral hands but she declined.  She has tenderness over right anserine bursa consistent with anserine bursitis.  I offered cortisone  injection.  She was to wait until the next visit.  We will call in Voltaren gel prescription.  The plan of care was discussed as  noted above.  Bo Merino, MD  Note - This record has been created using Editor, commissioning.  Chart creation errors have been sought, but may not always  have been located. Such creation errors do not reflect on  the standard of medical care.

## 2018-01-19 NOTE — Patient Instructions (Signed)
Standing Labs We placed an order today for your standing lab work.    Please come back and get your standing labs in may and every 3 months  We have open lab Monday through Friday from 8:30-11:30 AM and 1:30-4:00 PM  at the office of Dr. Pollyann Savoy.   You may experience shorter wait times on Monday and Friday afternoons. The office is located at [address], [place], [place] [address] No appointment is necessary.   Labs are drawn by [facility].  You may receive a bill from [facility] for your lab work. If you have any questions regarding directions or hours of operation,  please call [phone].

## 2018-01-20 ENCOUNTER — Telehealth: Payer: Self-pay

## 2018-01-20 NOTE — Telephone Encounter (Signed)
Received a prior authorization for Voltren Gel form [facility]. Authorization asks if pt has tried and failed at least 2 NSAIDs. Called pt to verify. Pt states that she has only tried IBUprofen. Gave her information about the GoodRx coupon to use at the pharmacy if it was denied. She will have her husband come by to pick one up. Authorization was submitted to pts insurance via cover my meds. Will update once we have a response.   Shondrika Hoque, [place], CPhT 11:53 AM

## 2018-01-24 NOTE — Telephone Encounter (Signed)
Received a fax from [facility] requesting clarification on tried and failed meds. Pt has tried and failed IBUprofen. Per Dr. Corliss Skains, pt has an increased risk in elevated LFT's while taking oral nsaids. Form was documented and faxed back.   Will send document to scan center.   Will update once we have a response.   Welcome Fults, [place], CPhT 8:28 AM

## 2018-01-31 NOTE — Telephone Encounter (Signed)
Received a fax from [facility] regarding a prior authorization approval for DICLOFENAC SODIUM GEL from 01/26/18 to 10/17/2018.   Reference number: [id number] Phone number:[phone]  Will send document to scan center.  Called pt to update.   Bailee Thall, [place], CPhT 8:12 AM

## 2018-03-13 ENCOUNTER — Other Ambulatory Visit: Payer: Self-pay | Admitting: Rheumatology

## 2018-03-14 NOTE — Telephone Encounter (Signed)
Last visit: 01/19/2018 Next visit: 06/28/2018 Labs: 12/13/2017 WNL   Okay to refill per Dr. Corliss Skains.

## 2018-03-27 ENCOUNTER — Other Ambulatory Visit: Payer: Self-pay

## 2018-03-27 DIAGNOSIS — Z79899 Other long term (current) drug therapy: Secondary | ICD-10-CM

## 2018-03-28 LAB — CBC WITH DIFFERENTIAL/PLATELET
Basophils Absolute: 21 cells/uL (ref 0–200)
Basophils Relative: 0.4 %
Eosinophils Absolute: 68 cells/uL (ref 15–500)
Eosinophils Relative: 1.3 %
HCT: 33.4 % — ABNORMAL LOW (ref 35.0–45.0)
Hemoglobin: 11.6 g/dL — ABNORMAL LOW (ref 11.7–15.5)
Lymphs Abs: 1929 cells/uL (ref 850–3900)
MCH: 32.9 pg (ref 27.0–33.0)
MCHC: 34.7 g/dL (ref 32.0–36.0)
MCV: 94.6 fL (ref 80.0–100.0)
MPV: 11.5 fL (ref 7.5–12.5)
Monocytes Relative: 7.5 %
Neutro Abs: 2792 cells/uL (ref 1500–7800)
Neutrophils Relative %: 53.7 %
Platelets: 214 10*3/uL (ref 140–400)
RBC: 3.53 10*6/uL — ABNORMAL LOW (ref 3.80–5.10)
RDW: 12.9 % (ref 11.0–15.0)
Total Lymphocyte: 37.1 %
WBC mixed population: 390 cells/uL (ref 200–950)
WBC: 5.2 10*3/uL (ref 3.8–10.8)

## 2018-03-28 LAB — COMPLETE METABOLIC PANEL WITH GFR
AG Ratio: 1.4 (calc) (ref 1.0–2.5)
ALT: 19 U/L (ref 6–29)
AST: 22 U/L (ref 10–35)
Albumin: 4.1 g/dL (ref 3.6–5.1)
Alkaline phosphatase (APISO): 66 U/L (ref 33–130)
BUN: 15 mg/dL (ref 7–25)
CO2: 28 mmol/L (ref 20–32)
Calcium: 9.8 mg/dL (ref 8.6–10.4)
Chloride: 101 mmol/L (ref 98–110)
Creat: 0.71 mg/dL (ref 0.50–0.99)
GFR, Est African American: 104 mL/min/{1.73_m2} (ref >=60)
GFR, Est Non African American: 89 mL/min/{1.73_m2} (ref >=60)
Globulin: 3 g/dL (calc) (ref 1.9–3.7)
Glucose, Bld: 91 mg/dL (ref 65–99)
Potassium: 4.1 mmol/L (ref 3.5–5.3)
Sodium: 139 mmol/L (ref 135–146)
Total Bilirubin: 0.5 mg/dL (ref 0.2–1.2)
Total Protein: 7.1 g/dL (ref 6.1–8.1)

## 2018-03-28 NOTE — Progress Notes (Signed)
Mild anemia

## 2018-05-15 ENCOUNTER — Other Ambulatory Visit: Payer: Self-pay | Admitting: Rheumatology

## 2018-05-15 NOTE — Telephone Encounter (Signed)
Last visit: 01/19/2018 Next visit: 06/28/2018 Labs: 03/27/18 Mild anemia   Okay to refill per Dr. Deveshwar 

## 2018-06-15 ENCOUNTER — Other Ambulatory Visit: Payer: Self-pay | Admitting: Rheumatology

## 2018-06-15 NOTE — Telephone Encounter (Signed)
Last visit: 01/19/2018 Next visit: 06/28/2018 Labs: 03/27/18 Mild anemia   Okay to refill per Dr. Corliss Skains

## 2018-06-16 NOTE — Progress Notes (Signed)
Office Visit Note  Patient: Heather Koch             Date of Birth: August 11, 1951           MRN: [id number]             PCP: Burton Apley, MD Referring: Burton Apley, MD Visit Date: 06/28/2018 Occupation: @GUAROCC @  Subjective:  Pain in both hands.   History of Present Illness: Heather Koch is a 67 y.o. female history of seropositive rheumatoid arthritis and osteoarthritis.  She states she has been painting her house and which is causing some stiffness in her hands.  She denies any joint swelling.  She has been tolerating methotrexate and sulfasalazine well.  She has some discomfort in her knees and feet from climbing up and down the letter.  Activities of Daily Living:  Patient reports morning stiffness for minute.   Patient Denies nocturnal pain.  Difficulty dressing/grooming: Denies Difficulty climbing stairs: Denies Difficulty getting out of chair: Denies Difficulty using hands for taps, buttons, cutlery, and/or writing: Denies  Review of Systems  Constitutional: Negative for fatigue, night sweats, weight gain and weight loss.  HENT: Negative for mouth sores, trouble swallowing, trouble swallowing, mouth dryness and nose dryness.   Eyes: Negative for pain, redness, visual disturbance and dryness.  Respiratory: Negative for cough, shortness of breath and difficulty breathing.   Cardiovascular: Negative for chest pain, palpitations, hypertension, irregular heartbeat and swelling in legs/feet.  Gastrointestinal: Negative for blood in stool, constipation and diarrhea.  Endocrine: Negative for increased urination.  Genitourinary: Negative for vaginal dryness.  Musculoskeletal: Positive for arthralgias and joint pain. Negative for joint swelling, myalgias, muscle weakness, morning stiffness, muscle tenderness and myalgias.  Skin: Negative for color change, rash, hair loss, skin tightness, ulcers and sensitivity to sunlight.  Allergic/Immunologic: Negative for susceptible to  infections.  Neurological: Negative for dizziness, memory loss, night sweats and weakness.  Hematological: Negative for swollen glands.  Psychiatric/Behavioral: Negative for depressed mood and sleep disturbance. The patient is not nervous/anxious.     PMFS History:  Patient Active Problem List   Diagnosis Date Noted  . Abnormal lung sounds 04/12/2017  . Rheumatoid arthritis involving multiple sites with positive rheumatoid factor (HCC) 04/07/2017  . High risk medication use 04/07/2017  . Primary osteoarthritis of both hands 04/07/2017  . Vitamin D deficiency 04/07/2017  . History of Granuloma annulare 04/07/2017  . Osteopenia of multiple sites 04/07/2017  . ANXIETY 01/31/2008  . CONTACT DERMATITIS&OTHER ECZEMA DUE TO PLANTS 01/31/2008  . ALLERGIC RHINITIS 06/29/2007  . COLONIC POLYPS, HX OF 06/29/2007    Past Medical History:  Diagnosis Date  . ASCUS (atypical squamous cells of undetermined significance) on Pap smear 12/2008   neg HR HPV  . LGSIL (low grade squamous intraepithelial dysplasia) 2005/2006  . Lichen sclerosus 2012  . Rheumatoid arthritis(714.0) 2012    Family History  Problem Relation Age of Onset  . Hypertension Mother   . Diabetes Mother   . Cancer Father        liver  . Heart disease Sister   . Cancer Brother        pancreatic cancer  . Cancer Maternal Aunt        throat  . Heart disease Sister   . Heart disease Sister   . Cancer Maternal Uncle        Colon  . Healthy Son   . Healthy Son   . Healthy Daughter    Past Surgical History:  Procedure Laterality Date  . TUBAL LIGATION  1997   Social History   Social History Narrative  . Not on file    Objective: Vital Signs: BP 135/79 (BP Location: Left Arm, Patient Position: Sitting, Cuff Size: Normal)   Pulse 79   Resp 13   Ht 5\' 4"  (1.626 m)   Wt 145 lb 3.2 oz (65.9 kg)   LMP 03/31/2003   BMI 24.92 kg/m    Physical Exam  Constitutional: She is oriented to person, place, and time. She  appears well-developed and well-nourished.  HENT:  Head: Normocephalic and atraumatic.  Eyes: Conjunctivae and EOM are normal.  Neck: Normal range of motion.  Cardiovascular: Normal rate, regular rhythm, normal heart sounds and intact distal pulses.  Pulmonary/Chest: Effort normal and breath sounds normal.  Abdominal: Soft. Bowel sounds are normal.  Lymphadenopathy:    She has no cervical adenopathy.  Neurological: She is alert and oriented to person, place, and time.  Skin: Skin is warm and dry. Capillary refill takes less than 2 seconds.  Psychiatric: She has a normal mood and affect. Her behavior is normal.  Nursing note and vitals reviewed.    Musculoskeletal Exam: C-spine thoracic lumbar spine good range of motion.  She has thoracolumbar scoliosis.  Shoulder joints elbow joints wrist joint MCPs PIPs DIPs were in good range of motion.  She does have bilateral ulnar deviation with synovial thickening over MCP joints.  She had mild synovitis over left second MCP joint.  Hip joints knee joints ankles MTPs PIPs were in good range of motion.  She had some thickening over MTP joints.  CDAI Exam: CDAI Score: 1.4  Patient Global Assessment: 2 (mm); Provider Global Assessment: 2 (mm) Swollen: 1 ; Tender: 0  Joint Exam      Right  Left  MCP 2     Swollen      Investigation: No additional findings.  Imaging: Xr Foot 2 Views Left  Result Date: 06/28/2018 First MTP, PIP and DIP narrowing was noted.  First MTP and first PIP erosive versus cystic changes were noted.  No intertarsal or subtalar joint space narrowing was noted. Impression: These findings are consistent with rheumatoid arthritis and osteoarthritis overlap.  Xr Foot 2 Views Right  Result Date: 06/28/2018 Juxta-articular osteopenia was noted.  All MTP joint space narrowing was noted.  PIP and DIP narrowing was noted.  Erosive changes were noted in first PIP joint.  Without any interval change from 2017 x-rays.  No intertarsal  or tibiotalar joint space narrowing was noted. Impression: These findings are consistent with rheumatoid arthritis and osteoarthritis overlap.  Xr Hand 2 View Left  Result Date: 06/28/2018 Juxta-articular osteopenia was noted.  MCP PIP and DIP narrowing was noted.  Intercarpal and radiocarpal joint space narrowing was noted.  An erosion was noted in the second MCP joint in comparison to films of 2017. Impression: These findings are consistent with rheumatoid arthritis and osteoarthritis overlap.  Xr Hand 2 View Right  Result Date: 06/28/2018 Juxta-articular osteopenia was noted.  First second third and fourth MCP joint narrowing was noted.  PIP and DIP narrowing was noted.  Intercarpal radiocarpal and metacarpal carpal joint space narrowing was noted.  No progression was noted from the films of 2017.  Few erosive changes were noted in first MCP and second MCP joints. Impression: These findings are consistent with rheumatoid arthritis and osteoarthritis overlap.   Recent Labs: Lab Results  Component Value Date   WBC 5.2 03/27/2018   HGB  11.6 (L) 03/27/2018   PLT 214 03/27/2018   NA 139 03/27/2018   K 4.1 03/27/2018   CL 101 03/27/2018   CO2 28 03/27/2018   GLUCOSE 91 03/27/2018   BUN 15 03/27/2018   CREATININE 0.71 03/27/2018   BILITOT 0.5 03/27/2018   ALKPHOS 67 05/31/2017   AST 22 03/27/2018   ALT 19 03/27/2018   PROT 7.1 03/27/2018   ALBUMIN 4.0 05/31/2017   CALCIUM 9.8 03/27/2018   GFRAA 104 03/27/2018    Speciality Comments: No specialty comments available.  Procedures:  No procedures performed Allergies: Patient has no known allergies.   Assessment / Plan:     Visit Diagnoses: Rheumatoid arthritis involving multiple sites with positive rheumatoid factor (HCC) -erosive disease with +CCP +RF -ANA.  Patient has been experiencing some joint discomfort as she has been very active and painting her house.  She does have some synovial thickening and some synovitis in her left  second MCP joint.  PIP and DIP thickening was also noted.  X-rays of bilateral hands and bilateral feet were obtained today.  Which revealed new erosion in her left second MCP joint.  Other x-rays did not show any drug radiographic progression.  I had a brief discussion regarding adding a biologic DMARD.  But patient declined.  At this point she wants to continue on the current regimen.  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  High risk medication use - MTX 0.8 ML subcutaneous every week, folic acid 2 mg by mouth daily and SSZ 500 mg 2 tablets by mouth twice a day.  Her labs have been stable.  We will continue to monitor her labs every 3 months.  Primary osteoarthritis of both hands-joint protection muscle strengthening was discussed.  Osteopenia of multiple sites - T score of -2.5 on DEXA done on 12/15/2015. She previously declined starting Fosamax.  She wants to hold off repeat bone density at this time.  History of vitamin D deficiency-she is on calcium and vitamin D.  History of colon polyps  Granuloma annulare  History of anxiety   Orders: Orders Placed This Encounter  Procedures  . XR Hand 2 View Right  . XR Hand 2 View Left  . XR Foot 2 Views Right  . XR Foot 2 Views Left  . CBC with Differential/Platelet  . COMPLETE METABOLIC PANEL WITH GFR   No orders of the defined types were placed in this encounter.   Face-to-face time spent with patient was 30 minutes. Greater than 50% of time was spent in counseling and coordination of care.  Follow-Up Instructions: Return in about 5 months (around 11/28/2018) for Rheumatoid arthritis, Osteoarthritis.   Pollyann Savoy, MD  Note - This record has been created using Animal nutritionist.  Chart creation errors have been sought, but may not always  have been located. Such creation errors do not reflect on  the standard of medical care.

## 2018-06-28 ENCOUNTER — Ambulatory Visit (INDEPENDENT_AMBULATORY_CARE_PROVIDER_SITE_OTHER): Payer: Medicare HMO

## 2018-06-28 ENCOUNTER — Ambulatory Visit (INDEPENDENT_AMBULATORY_CARE_PROVIDER_SITE_OTHER): Payer: Self-pay

## 2018-06-28 ENCOUNTER — Encounter: Payer: Self-pay | Admitting: Rheumatology

## 2018-06-28 ENCOUNTER — Ambulatory Visit: Payer: Medicare HMO | Admitting: Rheumatology

## 2018-06-28 VITALS — BP 135/79 | HR 79 | Resp 13 | Ht 64.0 in | Wt 145.2 lb

## 2018-06-28 DIAGNOSIS — M79671 Pain in right foot: Secondary | ICD-10-CM

## 2018-06-28 DIAGNOSIS — M8589 Other specified disorders of bone density and structure, multiple sites: Secondary | ICD-10-CM

## 2018-06-28 DIAGNOSIS — M79642 Pain in left hand: Secondary | ICD-10-CM

## 2018-06-28 DIAGNOSIS — L920 Granuloma annulare: Secondary | ICD-10-CM

## 2018-06-28 DIAGNOSIS — M79672 Pain in left foot: Secondary | ICD-10-CM

## 2018-06-28 DIAGNOSIS — M79641 Pain in right hand: Secondary | ICD-10-CM | POA: Diagnosis not present

## 2018-06-28 DIAGNOSIS — Z8639 Personal history of other endocrine, nutritional and metabolic disease: Secondary | ICD-10-CM

## 2018-06-28 DIAGNOSIS — M19042 Primary osteoarthritis, left hand: Secondary | ICD-10-CM

## 2018-06-28 DIAGNOSIS — M0579 Rheumatoid arthritis with rheumatoid factor of multiple sites without organ or systems involvement: Secondary | ICD-10-CM

## 2018-06-28 DIAGNOSIS — M19041 Primary osteoarthritis, right hand: Secondary | ICD-10-CM

## 2018-06-28 DIAGNOSIS — Z86010 Personal history of colonic polyps: Secondary | ICD-10-CM

## 2018-06-28 DIAGNOSIS — Z79899 Other long term (current) drug therapy: Secondary | ICD-10-CM | POA: Diagnosis not present

## 2018-06-28 DIAGNOSIS — M0630 Rheumatoid nodule, unspecified site: Secondary | ICD-10-CM

## 2018-06-28 DIAGNOSIS — Z8659 Personal history of other mental and behavioral disorders: Secondary | ICD-10-CM

## 2018-06-28 NOTE — Patient Instructions (Signed)
Standing Labs We placed an order today for your standing lab work.    Please come back and get your standing labs in December and every 3 months   We have open lab Monday through Friday from 8:30-11:30 AM and 1:30-4:00 PM  at the office of Dr. Anisia Leija.   You may experience shorter wait times on Monday and Friday afternoons. The office is located at [address], [address], [place], [place] [address] No appointment is necessary.   Labs are drawn by [facility].  You may receive a bill from [facility] for your lab work. If you have any questions regarding directions or hours of operation,  please call [phone].     

## 2018-06-29 LAB — COMPLETE METABOLIC PANEL WITH GFR
AG Ratio: 1.4 (calc) (ref 1.0–2.5)
ALT: 9 U/L (ref 6–29)
AST: 17 U/L (ref 10–35)
Albumin: 4 g/dL (ref 3.6–5.1)
Alkaline phosphatase (APISO): 57 U/L (ref 33–130)
BUN: 12 mg/dL (ref 7–25)
CO2: 28 mmol/L (ref 20–32)
Calcium: 9.6 mg/dL (ref 8.6–10.4)
Chloride: 103 mmol/L (ref 98–110)
Creat: 0.69 mg/dL (ref 0.50–0.99)
GFR, Est African American: 105 mL/min/{1.73_m2} (ref >=60)
GFR, Est Non African American: 91 mL/min/{1.73_m2} (ref >=60)
Globulin: 2.8 g/dL (calc) (ref 1.9–3.7)
Glucose, Bld: 83 mg/dL (ref 65–99)
Potassium: 4 mmol/L (ref 3.5–5.3)
Sodium: 139 mmol/L (ref 135–146)
Total Bilirubin: 0.3 mg/dL (ref 0.2–1.2)
Total Protein: 6.8 g/dL (ref 6.1–8.1)

## 2018-06-29 LAB — CBC WITH DIFFERENTIAL/PLATELET
Basophils Absolute: 30 cells/uL (ref 0–200)
Basophils Relative: 0.5 %
Eosinophils Absolute: 90 cells/uL (ref 15–500)
Eosinophils Relative: 1.5 %
HCT: 34.3 % — ABNORMAL LOW (ref 35.0–45.0)
Hemoglobin: 11.7 g/dL (ref 11.7–15.5)
Lymphs Abs: 1938 cells/uL (ref 850–3900)
MCH: 33.5 pg — ABNORMAL HIGH (ref 27.0–33.0)
MCHC: 34.1 g/dL (ref 32.0–36.0)
MCV: 98.3 fL (ref 80.0–100.0)
MPV: 11.6 fL (ref 7.5–12.5)
Monocytes Relative: 8.7 %
Neutro Abs: 3420 cells/uL (ref 1500–7800)
Neutrophils Relative %: 57 %
Platelets: 216 10*3/uL (ref 140–400)
RBC: 3.49 10*6/uL — ABNORMAL LOW (ref 3.80–5.10)
RDW: 13.8 % (ref 11.0–15.0)
Total Lymphocyte: 32.3 %
WBC mixed population: 522 cells/uL (ref 200–950)
WBC: 6 10*3/uL (ref 3.8–10.8)

## 2018-08-14 ENCOUNTER — Other Ambulatory Visit: Payer: Self-pay | Admitting: Rheumatology

## 2018-08-15 ENCOUNTER — Encounter: Payer: Self-pay | Admitting: Women's Health

## 2018-08-15 NOTE — Telephone Encounter (Addendum)
Last Visit: 06/28/18 Next Visit: 11/30/18 Labs: 06/28/18 CBC stable. CMP WNL.  Okay to refill per Dr. Deveshwar 

## 2018-08-16 ENCOUNTER — Other Ambulatory Visit: Payer: Self-pay | Admitting: Rheumatology

## 2018-08-17 NOTE — Telephone Encounter (Signed)
Last Visit: 06/28/18 Next Visit: 11/30/18 Labs: 06/28/18 CBC stable. CMP WNL.  Okay to refill per Dr. Corliss Skains

## 2018-09-25 ENCOUNTER — Other Ambulatory Visit: Payer: Self-pay

## 2018-09-25 DIAGNOSIS — Z79899 Other long term (current) drug therapy: Secondary | ICD-10-CM

## 2018-10-04 ENCOUNTER — Other Ambulatory Visit: Payer: Self-pay | Admitting: Rheumatology

## 2018-10-04 ENCOUNTER — Telehealth: Payer: Self-pay | Admitting: Rheumatology

## 2018-10-04 NOTE — Telephone Encounter (Signed)
Patient advised we have not received her lab results at this time. Patient advised she will contact them and have her results sent to our office.

## 2018-10-04 NOTE — Telephone Encounter (Signed)
Patient called stating she had her labwork done on 10/02/18 at [facility] or [facility] (she forgot the name) and is checking if the office has received the results.  Patient states it [place] on [address].  Patient states she is currently out of medication.  Patient requested a return call.

## 2018-10-04 NOTE — Telephone Encounter (Signed)
Last Visit: 06/28/18 Next Visit: 11/30/18  Okay to refill per Dr. Corliss Skains

## 2018-10-06 ENCOUNTER — Telehealth: Payer: Self-pay

## 2018-10-06 NOTE — Telephone Encounter (Signed)
Attempted to contact patient and left message on machine to advise patient we have received a copy of lab results from 10/02/2018.  RBC 3.59, all other labs WNL.

## 2018-11-14 ENCOUNTER — Other Ambulatory Visit: Payer: Self-pay | Admitting: Rheumatology

## 2018-11-14 NOTE — Telephone Encounter (Signed)
Last Visit: 06/28/18 Next Visit: 11/30/18 Labs: 10/02/18 RBC 3.59 all other labs WNL  Okay to refill per Dr. Corliss Skains

## 2018-11-16 NOTE — Progress Notes (Signed)
Office Visit Note  Patient: Heather Koch             Date of Birth: 11/13/1950           MRN: [id number]             PCP: Burton Apleyoberts, Ronald, MD Referring: Burton Apleyoberts, Ronald, MD Visit Date: 11/30/2018 Occupation: @GUAROCC @  Subjective:  Medication monitoring.    History of Present Illness: Heather Koch is a 68 y.o. female with history of seropositive rheumatoid arthritis and osteoarthritis.  She states she had some swelling in her left wrist joint around Christmas time and then got better.  She is currently not having any joint discomfort.  She states she has been having pain in her neck and some stiffness.  There is no history of radiculopathy.  Activities of Daily Living:  Patient reports morning stiffness for 45-60 minutes.   Patient Reports nocturnal pain.  Difficulty dressing/grooming: Denies Difficulty climbing stairs: Reports Difficulty getting out of chair: Denies Difficulty using hands for taps, buttons, cutlery, and/or writing: Reports  Review of Systems  Constitutional: Negative for fatigue, night sweats, weight gain and weight loss.  HENT: Negative for mouth sores, trouble swallowing, trouble swallowing, mouth dryness and nose dryness.   Eyes: Positive for dryness. Negative for pain, redness, itching and visual disturbance.  Respiratory: Negative for cough, shortness of breath, wheezing and difficulty breathing.   Cardiovascular: Negative for chest pain, palpitations, hypertension, irregular heartbeat and swelling in legs/feet.  Gastrointestinal: Negative for abdominal pain, blood in stool, constipation and diarrhea.  Endocrine: Negative for increased urination.  Genitourinary: Negative for painful urination, nocturia, pelvic pain and vaginal dryness.  Musculoskeletal: Positive for arthralgias, joint pain and morning stiffness. Negative for joint swelling, myalgias, muscle weakness, muscle tenderness and myalgias.  Skin: Negative for color change, rash, hair loss, redness,  skin tightness, ulcers and sensitivity to sunlight.  Allergic/Immunologic: Negative for susceptible to infections.  Neurological: Positive for headaches and weakness. Negative for dizziness, light-headedness, memory loss and night sweats.  Hematological: Negative for swollen glands.  Psychiatric/Behavioral: Negative for depressed mood, confusion and sleep disturbance. The patient is not nervous/anxious.     PMFS History:  Patient Active Problem List   Diagnosis Date Noted  . Abnormal lung sounds 04/12/2017  . Rheumatoid arthritis involving multiple sites with positive rheumatoid factor (HCC) 04/07/2017  . High risk medication use 04/07/2017  . Primary osteoarthritis of both hands 04/07/2017  . Vitamin D deficiency 04/07/2017  . History of Granuloma annulare 04/07/2017  . Osteopenia of multiple sites 04/07/2017  . ANXIETY 01/31/2008  . CONTACT DERMATITIS&OTHER ECZEMA DUE TO PLANTS 01/31/2008  . ALLERGIC RHINITIS 06/29/2007  . COLONIC POLYPS, HX OF 06/29/2007    Past Medical History:  Diagnosis Date  . ASCUS (atypical squamous cells of undetermined significance) on Pap smear 12/2008   neg HR HPV  . LGSIL (low grade squamous intraepithelial dysplasia) 2005/2006  . Lichen sclerosus 2012  . Rheumatoid arthritis(714.0) 2012    Family History  Problem Relation Age of Onset  . Hypertension Mother   . Diabetes Mother   . Cancer Father        liver  . Heart disease Sister   . Cancer Brother        pancreatic cancer  . Cancer Maternal Aunt        throat  . Heart disease Sister   . Heart disease Sister   . Cancer Maternal Uncle        Colon  .  Healthy Son   . Healthy Son   . Healthy Daughter    Past Surgical History:  Procedure Laterality Date  . TUBAL LIGATION  1997   Social History   Social History Narrative  . Not on file    There is no immunization history on file for this patient.   Objective: Vital Signs: BP 120/76 (BP Location: Left Arm, Patient Position:  Sitting, Cuff Size: Normal)   Pulse 83   Resp 12   Ht 5\' 4"  (1.626 m)   Wt 147 lb (66.7 kg)   LMP 03/31/2003   BMI 25.23 kg/m    Physical Exam Vitals signs and nursing note reviewed.  Constitutional:      Appearance: She is well-developed.  HENT:     Head: Normocephalic and atraumatic.  Eyes:     Conjunctiva/sclera: Conjunctivae normal.  Neck:     Musculoskeletal: Normal range of motion.  Cardiovascular:     Rate and Rhythm: Normal rate and regular rhythm.     Heart sounds: Normal heart sounds.  Pulmonary:     Effort: Pulmonary effort is normal.     Breath sounds: Normal breath sounds.  Abdominal:     General: Bowel sounds are normal.     Palpations: Abdomen is soft.  Lymphadenopathy:     Cervical: No cervical adenopathy.  Skin:    General: Skin is warm and dry.     Capillary Refill: Capillary refill takes less than 2 seconds.  Neurological:     Mental Status: She is alert and oriented to person, place, and time.  Psychiatric:        Behavior: Behavior normal.      Musculoskeletal Exam: She has some discomfort with lateral rotation.  Thoracic and lumbar spine were in good range of motion.  Shoulder joints elbow joints wrist joints were in good range of motion.  She has bilateral synovial thickening over MCP joints and ulnar deviation.  Tenosynovitis was noted over left wrist and the extensor tendons.  Hip joints knee joints ankles MTPs PIPs are in good range of motion.  No synovitis was noted.  CDAI Exam: CDAI Score: 1.6  Patient Global Assessment: 3 (mm); Provider Global Assessment: 3 (mm) Swollen: 1 ; Tender: 0  Joint Exam      Right  Left  Wrist     Swollen      Investigation: No additional findings.  Imaging: No results found.  Recent Labs: Lab Results  Component Value Date   WBC 6.0 06/28/2018   HGB 11.7 06/28/2018   PLT 216 06/28/2018   NA 139 06/28/2018   K 4.0 06/28/2018   CL 103 06/28/2018   CO2 28 06/28/2018   GLUCOSE 83 06/28/2018   BUN  12 06/28/2018   CREATININE 0.69 06/28/2018   BILITOT 0.3 06/28/2018   ALKPHOS 67 05/31/2017   AST 17 06/28/2018   ALT 9 06/28/2018   PROT 6.8 06/28/2018   ALBUMIN 4.0 05/31/2017   CALCIUM 9.6 06/28/2018   GFRAA 105 06/28/2018   12/19-labs at her PCPs office showed CBC was normal, CMP was normal, LDL normal, TSH normal, hemoglobin A1c normal Speciality Comments: No specialty comments available.  Procedures:  No procedures performed Allergies: Patient has no known allergies.   Assessment / Plan:     Visit Diagnoses: Rheumatoid arthritis involving multiple sites with positive rheumatoid factor (HCC) - erosive disease with +CCP +RF -ANA.  Patient is still have mild synovitis over left wrist joint.  We had detailed discussion regarding  different treatment options.  She does not want to change therapy.  Her x-rays obtained last visit showed erosive changes but no radiographic progression from 2017.  I will give her a prednisone taper starting at 20 mg and taper by 5 mg every 2 days.  High risk medication use - Methotrexate 0.8 mL weekly, folic acid 2 mg daily, sulfasalazine 1000 mg twice daily.  Most recent CBC/CMP within normal limits on 10/02/2018 and will monitor every 3 months.  Standing orders are in place. Recommend annual influenza, Pneumovax 23, Prevnar 13, and Shingrix as indicated  Rheumatoid nodulosis (HCC)  Primary osteoarthritis of both hands-joint protection muscle strengthening was discussed.  Neck pain-she has been experiencing neck pain.  I offered x-ray but she declined.  Have given her neck exercises.  Chronic midline low back pain without sciatica-she has chronic lower back pain as well.  For that back exercises were given.  Osteopenia of multiple sites - T score of -2.5 on DEXA done on 12/15/2015. She previously declined starting Fosamax.  We will schedule bone density.  Granuloma annulare  History of vitamin D deficiency  History of colon polyps  History of  anxiety   Orders: Orders Placed This Encounter  Procedures  . DG BONE DENSITY (DXA)   Meds ordered this encounter  Medications  . predniSONE (DELTASONE) 5 MG tablet    Sig: Take 4 tabs by mouth x2 days, 3 tabs by mouth x2 days, 2 tabs by mouth x2 days, 1 tab by mouth x2 days.    Dispense:  20 tablet    Refill:  0     Follow-Up Instructions: Return in about 5 months (around 04/30/2019) for Rheumatoid arthritis, Osteoarthritis.   Pollyann Savoy, MD  Note - This record has been created using Animal nutritionist.  Chart creation errors have been sought, but may not always  have been located. Such creation errors do not reflect on  the standard of medical care.

## 2018-11-30 ENCOUNTER — Encounter: Payer: Self-pay | Admitting: Rheumatology

## 2018-11-30 ENCOUNTER — Ambulatory Visit: Payer: Medicare HMO | Admitting: Rheumatology

## 2018-11-30 VITALS — BP 120/76 | HR 83 | Resp 12 | Ht 64.0 in | Wt 147.0 lb

## 2018-11-30 DIAGNOSIS — Z79899 Other long term (current) drug therapy: Secondary | ICD-10-CM

## 2018-11-30 DIAGNOSIS — M0579 Rheumatoid arthritis with rheumatoid factor of multiple sites without organ or systems involvement: Secondary | ICD-10-CM | POA: Diagnosis not present

## 2018-11-30 DIAGNOSIS — M19042 Primary osteoarthritis, left hand: Secondary | ICD-10-CM

## 2018-11-30 DIAGNOSIS — G8929 Other chronic pain: Secondary | ICD-10-CM

## 2018-11-30 DIAGNOSIS — M0630 Rheumatoid nodule, unspecified site: Secondary | ICD-10-CM

## 2018-11-30 DIAGNOSIS — M545 Low back pain: Secondary | ICD-10-CM

## 2018-11-30 DIAGNOSIS — Z8659 Personal history of other mental and behavioral disorders: Secondary | ICD-10-CM

## 2018-11-30 DIAGNOSIS — M542 Cervicalgia: Secondary | ICD-10-CM

## 2018-11-30 DIAGNOSIS — L920 Granuloma annulare: Secondary | ICD-10-CM

## 2018-11-30 DIAGNOSIS — M19041 Primary osteoarthritis, right hand: Secondary | ICD-10-CM

## 2018-11-30 DIAGNOSIS — M8589 Other specified disorders of bone density and structure, multiple sites: Secondary | ICD-10-CM

## 2018-11-30 DIAGNOSIS — Z86010 Personal history of colonic polyps: Secondary | ICD-10-CM

## 2018-11-30 DIAGNOSIS — Z8639 Personal history of other endocrine, nutritional and metabolic disease: Secondary | ICD-10-CM

## 2018-11-30 MED ORDER — PREDNISONE 5 MG PO TABS: ORAL_TABLET | ORAL | 0 refills | Status: DC

## 2018-11-30 NOTE — Patient Instructions (Addendum)
Cervical Strain and Sprain Rehab Ask your health care provider which exercises are safe for you. Do exercises exactly as told by your health care provider and adjust them as directed. It is normal to feel mild stretching, pulling, tightness, or discomfort as you do these exercises, but you should stop right away if you feel sudden pain or your pain gets worse.Do not begin these exercises until told by your health care provider. Stretching and range of motion exercises These exercises warm up your muscles and joints and improve the movement and flexibility of your neck. These exercises also help to relieve pain, numbness, and tingling. Exercise A: Cervical side bend  1. Using good posture, sit on a stable chair or stand up. 2. Without moving your shoulders, slowly tilt your left / right ear to your shoulder until you feel a stretch in your neck muscles. You should be looking straight ahead. 3. Hold for __________ seconds. 4. Repeat with the other side of your neck. Repeat __________ times. Complete this exercise __________ times a day. Exercise B: Cervical rotation  1. Using good posture, sit on a stable chair or stand up. 2. Slowly turn your head to the side as if you are looking over your left / right shoulder. ? Keep your eyes level with the ground. ? Stop when you feel a stretch along the side and the back of your neck. 3. Hold for __________ seconds. 4. Repeat this by turning to your other side. Repeat __________ times. Complete this exercise __________ times a day. Exercise C: Thoracic extension and pectoral stretch 1. Roll a towel or a small blanket so it is about 4 inches (10 cm) in diameter. 2. Lie down on your back on a firm surface. 3. Put the towel lengthwise, under your spine in the middle of your back. It should not be not under your shoulder blades. The towel should line up with your spine from your middle back to your lower back. 4. Put your hands behind your head and let your  elbows fall out to your sides. 5. Hold for __________ seconds. Repeat __________ times. Complete this exercise __________ times a day. Strengthening exercises These exercises build strength and endurance in your neck. Endurance is the ability to use your muscles for a long time, even after your muscles get tired. Exercise D: Upper cervical flexion, isometric 1. Lie on your back with a thin pillow behind your head and a small rolled-up towel under your neck. 2. Gently tuck your chin toward your chest and nod your head down to look toward your feet. Do not lift your head off the pillow. 3. Hold for __________ seconds. 4. Release the tension slowly. Relax your neck muscles completely before you repeat this exercise. Repeat __________ times. Complete this exercise __________ times a day. Exercise E: Cervical extension, isometric  1. Stand about 6 inches (15 cm) away from a wall, with your back facing the wall. 2. Place a soft object, about 6-8 inches (15-20 cm) in diameter, between the back of your head and the wall. A soft object could be a small pillow, a ball, or a folded towel. 3. Gently tilt your head back and press into the soft object. Keep your jaw and forehead relaxed. 4. Hold for __________ seconds. 5. Release the tension slowly. Relax your neck muscles completely before you repeat this exercise. Repeat __________ times. Complete this exercise __________ times a day. Posture and body mechanics Body mechanics refers to the movements and positions of your   body while you do your daily activities. Posture is part of body mechanics. Good posture and healthy body mechanics can help to relieve stress in your body's tissues and joints. Good posture means that your spine is in its natural S-curve position (your spine is neutral), your shoulders are pulled back slightly, and your head is not tipped forward. The following are general guidelines for applying improved posture and body mechanics to your  everyday activities. Standing   When standing, keep your spine neutral and keep your feet about hip-width apart. Keep a slight bend in your knees. Your ears, shoulders, and hips should line up.  When you do a task in which you stand in one place for a long time, place one foot up on a stable object that is 2-4 inches (5-10 cm) high, such as a footstool. This helps keep your spine neutral. Sitting   When sitting, keep your spine neutral and your keep feet flat on the floor. Use a footrest, if necessary, and keep your thighs parallel to the floor. Avoid rounding your shoulders, and avoid tilting your head forward.  When working at a desk or a computer, keep your desk at a height where your hands are slightly lower than your elbows. Slide your chair under your desk so you are close enough to maintain good posture.  When working at a computer, place your monitor at a height where you are looking straight ahead and you do not have to tilt your head forward or downward to look at the screen. Resting When lying down and resting, avoid positions that are most painful for you. Try to support your neck in a neutral position. You can use a contour pillow or a small rolled-up towel. Your pillow should support your neck but not push on it. This information is not intended to replace advice given to you by your health care provider. Make sure you discuss any questions you have with your health care provider. Document Released: 10/04/2005 Document Revised: 06/10/2016 Document Reviewed: 09/10/2015 Elsevier Interactive Patient Education  2019 [employer] Back Exercises The following exercises strengthen the muscles that help to support the back. They also help to keep the lower back flexible. Doing these exercises can help to prevent back pain or lessen existing pain. If you have back pain or discomfort, try doing these exercises 2-3 times each day or as told by your health care provider. When the pain goes  away, do them once each day, but increase the number of times that you repeat the steps for each exercise (do more repetitions). If you do not have back pain or discomfort, do these exercises once each day or as told by your health care provider. Exercises Single Knee to Chest Repeat these steps 3-5 times for each leg: 1. Lie on your back on a firm bed or the floor with your legs extended. 2. Bring one knee to your chest. Your other leg should stay extended and in contact with the floor. 3. Hold your knee in place by grabbing your knee or thigh. 4. Pull on your knee until you feel a gentle stretch in your lower back. 5. Hold the stretch for 10-30 seconds. 6. Slowly release and straighten your leg. Pelvic Tilt Repeat these steps 5-10 times: 1. Lie on your back on a firm bed or the floor with your legs extended. 2. Bend your knees so they are pointing toward the ceiling and your feet are flat on the floor. 3. Tighten your lower abdominal  muscles to press your lower back against the floor. This motion will tilt your pelvis so your tailbone points up toward the ceiling instead of pointing to your feet or the floor. 4. With gentle tension and even breathing, hold this position for 5-10 seconds. Cat-Cow Repeat these steps until your lower back becomes more flexible: 1. Get into a hands-and-knees position on a firm surface. Keep your hands under your shoulders, and keep your knees under your hips. You may place padding under your knees for comfort. 2. Let your head hang down, and point your tailbone toward the floor so your lower back becomes rounded like the back of a cat. 3. Hold this position for 5 seconds. 4. Slowly lift your head and point your tailbone up toward the ceiling so your back forms a sagging arch like the back of a cow. 5. Hold this position for 5 seconds.  Press-Ups Repeat these steps 5-10 times: 1. Lie on your abdomen (face-down) on the floor. 2. Place your palms near your  head, about shoulder-width apart. 3. While you keep your back as relaxed as possible and keep your hips on the floor, slowly straighten your arms to raise the top half of your body and lift your shoulders. Do not use your back muscles to raise your upper torso. You may adjust the placement of your hands to make yourself more comfortable. 4. Hold this position for 5 seconds while you keep your back relaxed. 5. Slowly return to lying flat on the floor.  Bridges Repeat these steps 10 times: 1. Lie on your back on a firm surface. 2. Bend your knees so they are pointing toward the ceiling and your feet are flat on the floor. 3. Tighten your buttocks muscles and lift your buttocks off of the floor until your waist is at almost the same height as your knees. You should feel the muscles working in your buttocks and the back of your thighs. If you do not feel these muscles, slide your feet 1-2 inches farther away from your buttocks. 4. Hold this position for 3-5 seconds. 5. Slowly lower your hips to the starting position, and allow your buttocks muscles to relax completely. If this exercise is too easy, try doing it with your arms crossed over your chest. Abdominal Crunches Repeat these steps 5-10 times: 1. Lie on your back on a firm bed or the floor with your legs extended. 2. Bend your knees so they are pointing toward the ceiling and your feet are flat on the floor. 3. Cross your arms over your chest. 4. Tip your chin slightly toward your chest without bending your neck. 5. Tighten your abdominal muscles and slowly raise your trunk (torso) high enough to lift your shoulder blades a tiny bit off of the floor. Avoid raising your torso higher than that, because it can put too much stress on your low back and it does not help to strengthen your abdominal muscles. 6. Slowly return to your starting position. Back Lifts Repeat these steps 5-10 times: 1. Lie on your abdomen (face-down) with your arms at  your sides, and rest your forehead on the floor. 2. Tighten the muscles in your legs and your buttocks. 3. Slowly lift your chest off of the floor while you keep your hips pressed to the floor. Keep the back of your head in line with the curve in your back. Your eyes should be looking at the floor. 4. Hold this position for 3-5 seconds. 5. Slowly return to   your starting position. Contact a health care provider if:  Your back pain or discomfort gets much worse when you do an exercise.  Your back pain or discomfort does not lessen within 2 hours after you exercise. If you have any of these problems, stop doing these exercises right away. Do not do them again unless your health care provider says that you can. Get help right away if:  You develop sudden, severe back pain. If this happens, stop doing the exercises right away. Do not do them again unless your health care provider says that you can. This information is not intended to replace advice given to you by your health care provider. Make sure you discuss any questions you have with your health care provider. Document Released: 11/11/2004 Document Revised: 02/07/2018 Document Reviewed: 11/28/2014 Elsevier Interactive Patient Education  2019 [employer]. Standing Labs We placed an order today for your standing lab work.    Please come back and get your standing labs in March and every 3 months  We have open lab Monday through Friday from 8:30-11:30 AM and 1:30-4:00 PM  at the office of Dr. Pollyann Savoy.   You may experience shorter wait times on Monday and Friday afternoons. The office is located at [address], [place], [place] [address] No appointment is necessary.   Labs are drawn by [facility].  You may receive a bill from [facility] for your lab work.  If you wish to have your labs drawn at another location, please call the office 24 hours in advance to send orders.  If you have any questions regarding directions or  hours of operation,  please call [phone].   Just as a reminder please drink plenty of water prior to coming for your lab work. Thanks!

## 2018-12-11 ENCOUNTER — Other Ambulatory Visit: Payer: Self-pay | Admitting: Rheumatology

## 2018-12-11 NOTE — Telephone Encounter (Signed)
Last Visit: 11/30/18 Next Visit: 05/03/19 Labs: 10/02/18 RBC 3.59 all other labs WNL  Okay to refill per Dr. Corliss Skains

## 2019-01-02 ENCOUNTER — Encounter: Payer: Medicare Other | Admitting: Women's Health

## 2019-02-16 ENCOUNTER — Other Ambulatory Visit: Payer: Self-pay | Admitting: Rheumatology

## 2019-02-16 ENCOUNTER — Telehealth: Payer: Self-pay | Admitting: Rheumatology

## 2019-02-16 ENCOUNTER — Other Ambulatory Visit: Payer: Self-pay | Admitting: *Deleted

## 2019-02-16 DIAGNOSIS — Z79899 Other long term (current) drug therapy: Secondary | ICD-10-CM

## 2019-02-16 NOTE — Telephone Encounter (Signed)
Lab orders released.  

## 2019-02-16 NOTE — Telephone Encounter (Signed)
Last Visit: 11/30/18 Next Visit: 05/03/19 Labs: 10/02/18 RBC 3.59 all other labs WNL  Patient updating labs today.   Okay to refill per Dr. Corliss Skains

## 2019-02-16 NOTE — Telephone Encounter (Signed)
Patient going to [facility] on [address] today for labs. Please release orders. °

## 2019-02-17 LAB — CBC WITH DIFFERENTIAL/PLATELET
Absolute Monocytes: 391 cells/uL (ref 200–950)
Basophils Absolute: 28 cells/uL (ref 0–200)
Basophils Relative: 0.5 %
Eosinophils Absolute: 110 cells/uL (ref 15–500)
Eosinophils Relative: 2 %
HCT: 34 % — ABNORMAL LOW (ref 35.0–45.0)
Hemoglobin: 11.8 g/dL (ref 11.7–15.5)
Lymphs Abs: 2118 cells/uL (ref 850–3900)
MCH: 33.7 pg — ABNORMAL HIGH (ref 27.0–33.0)
MCHC: 34.7 g/dL (ref 32.0–36.0)
MCV: 97.1 fL (ref 80.0–100.0)
MPV: 11.6 fL (ref 7.5–12.5)
Monocytes Relative: 7.1 %
Neutro Abs: 2855 cells/uL (ref 1500–7800)
Neutrophils Relative %: 51.9 %
Platelets: 216 10*3/uL (ref 140–400)
RBC: 3.5 10*6/uL — ABNORMAL LOW (ref 3.80–5.10)
RDW: 13 % (ref 11.0–15.0)
Total Lymphocyte: 38.5 %
WBC: 5.5 10*3/uL (ref 3.8–10.8)

## 2019-02-17 LAB — COMPLETE METABOLIC PANEL WITH GFR
AG Ratio: 1.4 (calc) (ref 1.0–2.5)
ALT: 11 U/L (ref 6–29)
AST: 18 U/L (ref 10–35)
Albumin: 4.2 g/dL (ref 3.6–5.1)
Alkaline phosphatase (APISO): 56 U/L (ref 37–153)
BUN: 10 mg/dL (ref 7–25)
CO2: 28 mmol/L (ref 20–32)
Calcium: 9.5 mg/dL (ref 8.6–10.4)
Chloride: 104 mmol/L (ref 98–110)
Creat: 0.73 mg/dL (ref 0.50–0.99)
GFR, Est African American: 99 mL/min/{1.73_m2} (ref >=60)
GFR, Est Non African American: 86 mL/min/{1.73_m2} (ref >=60)
Globulin: 2.9 g/dL (calc) (ref 1.9–3.7)
Glucose, Bld: 88 mg/dL (ref 65–99)
Potassium: 4 mmol/L (ref 3.5–5.3)
Sodium: 140 mmol/L (ref 135–146)
Total Bilirubin: 0.5 mg/dL (ref 0.2–1.2)
Total Protein: 7.1 g/dL (ref 6.1–8.1)

## 2019-02-20 ENCOUNTER — Other Ambulatory Visit: Payer: Self-pay | Admitting: Rheumatology

## 2019-02-20 ENCOUNTER — Other Ambulatory Visit: Payer: Self-pay

## 2019-02-21 ENCOUNTER — Ambulatory Visit (INDEPENDENT_AMBULATORY_CARE_PROVIDER_SITE_OTHER): Payer: Medicare Other | Admitting: Women's Health

## 2019-02-21 ENCOUNTER — Other Ambulatory Visit: Payer: Self-pay | Admitting: Rheumatology

## 2019-02-21 ENCOUNTER — Encounter: Payer: Self-pay | Admitting: Women's Health

## 2019-02-21 VITALS — BP 132/80 | Ht 64.0 in | Wt 146.0 lb

## 2019-02-21 DIAGNOSIS — L900 Lichen sclerosus et atrophicus: Secondary | ICD-10-CM | POA: Diagnosis not present

## 2019-02-21 DIAGNOSIS — Z01419 Encounter for gynecological examination (general) (routine) without abnormal findings: Secondary | ICD-10-CM

## 2019-02-21 MED ORDER — CLOBETASOL PROPIONATE 0.05 % EX CREA: TOPICAL_CREAM | CUTANEOUS | 1 refills | Status: DC

## 2019-02-21 NOTE — Addendum Note (Signed)
Addended by: Rushie Goltz on: 02/21/2019 02:23 PM   Modules accepted: Orders

## 2019-02-21 NOTE — Patient Instructions (Signed)
Health Maintenance After Age 67 After age 67, you are at a higher risk for certain long-term diseases and infections as well as injuries from falls. Falls are a major cause of broken bones and head injuries in people who are older than age 67. Getting regular preventive care can help to keep you healthy and well. Preventive care includes getting regular testing and making lifestyle changes as recommended by your health care provider. Talk with your health care provider about:  Which screenings and tests you should have. A screening is a test that checks for a disease when you have no symptoms.  A diet and exercise plan that is right for you. What should I know about screenings and tests to prevent falls? Screening and testing are the best ways to find a health problem early. Early diagnosis and treatment give you the best chance of managing medical conditions that are common after age 67. Certain conditions and lifestyle choices may make you more likely to have a fall. Your health care provider may recommend:  Regular vision checks. Poor vision and conditions such as cataracts can make you more likely to have a fall. If you wear glasses, make sure to get your prescription updated if your vision changes.  Medicine review. Work with your health care provider to regularly review all of the medicines you are taking, including over-the-counter medicines. Ask your health care provider about any side effects that may make you more likely to have a fall. Tell your health care provider if any medicines that you take make you feel dizzy or sleepy.  Osteoporosis screening. Osteoporosis is a condition that causes the bones to get weaker. This can make the bones weak and cause them to break more easily.  Blood pressure screening. Blood pressure changes and medicines to control blood pressure can make you feel dizzy.  Strength and balance checks. Your health care provider may recommend certain tests to check your  strength and balance while standing, walking, or changing positions.  Foot health exam. Foot pain and numbness, as well as not wearing proper footwear, can make you more likely to have a fall.  Depression screening. You may be more likely to have a fall if you have a fear of falling, feel emotionally low, or feel unable to do activities that you used to do.  Alcohol use screening. Using too much alcohol can affect your balance and may make you more likely to have a fall. What actions can I take to lower my risk of falls? General instructions  Talk with your health care provider about your risks for falling. Tell your health care provider if: ? You fall. Be sure to tell your health care provider about all falls, even ones that seem minor. ? You feel dizzy, sleepy, or off-balance.  Take over-the-counter and prescription medicines only as told by your health care provider. These include any supplements.  Eat a healthy diet and maintain a healthy weight. A healthy diet includes low-fat dairy products, low-fat (lean) meats, and fiber from whole grains, beans, and lots of fruits and vegetables. Home safety  Remove any tripping hazards, such as rugs, cords, and clutter.  Install safety equipment such as grab bars in bathrooms and safety rails on stairs.  Keep rooms and walkways well-lit. Activity   Follow a regular exercise program to stay fit. This will help you maintain your balance. Ask your health care provider what types of exercise are appropriate for you.  If you need a cane or   walker, use it as recommended by your health care provider.  Wear supportive shoes that have nonskid soles. Lifestyle  Do not drink alcohol if your health care provider tells you not to drink.  If you drink alcohol, limit how much you have: ? 0-1 drink a day for women. ? 0-2 drinks a day for men.  Be aware of how much alcohol is in your drink. In the [place] one drink equals one typical bottle of beer (12  oz), one-half glass of wine (5 oz), or one shot of hard liquor (1 oz).  Do not use any products that contain nicotine or tobacco, such as cigarettes and e-cigarettes. If you need help quitting, ask your health care provider. Summary  Having a healthy lifestyle and getting preventive care can help to protect your health and wellness after age 67.  Screening and testing are the best way to find a health problem early and help you avoid having a fall. Early diagnosis and treatment give you the best chance for managing medical conditions that are more common for people who are older than age 67.  Falls are a major cause of broken bones and head injuries in people who are older than age 67. Take precautions to prevent a fall at home.  Work with your health care provider to learn what changes you can make to improve your health and wellness and to prevent falls. This information is not intended to replace advice given to you by your health care provider. Make sure you discuss any questions you have with your health care provider. Document Released: 08/17/2017 Document Revised: 08/17/2017 Document Reviewed: 08/17/2017 Elsevier Interactive Patient Education  2019 Elsevier Inc.  

## 2019-02-21 NOTE — Progress Notes (Signed)
Heather Koch 1951/03/01 [id number]    History:    Presents for rest and pelvic exam with no complaints.  History of lichen sclerosis currently having no problems.  2006 LGSIL with normal Paps after.  Normal mammogram history. RA on methotrexate per rheumatologist.  Osteopenia rheumatologist manages DEXA.  Has had Pneumovax not Shingrex due to methotrexate.  2015- colon polyps 5-year follow-up.  Primary care manages hypercholesteremia.  Past medical history, past surgical history, family history and social history were all reviewed and documented in the EPIC chart.  3 children 2 sons, 1 daughter all live within the state 5 grandchildren.  ROS:  A ROS was performed and pertinent positives and negatives are included.  Exam:  Vitals:   02/21/19 1242  BP: 132/80  Weight: 146 lb (66.2 kg)  Height: 5\' 4"  (1.626 m)   Body mass index is 25.06 kg/m.   General appearance:  Normal Thyroid:  Symmetrical, normal in size, without palpable masses or nodularity. Respiratory  Auscultation:  Clear without wheezing or rhonchi Cardiovascular  Auscultation:  Regular rate, without rubs, murmurs or gallops  Edema/varicosities:  Not grossly evident Abdominal  Soft,nontender, without masses, guarding or rebound.  Liver/spleen:  No organomegaly noted  Hernia:  None appreciated  Skin  Inspection:  Grossly normal   Breasts: Examined lying and sitting.     Right: Without masses, retractions, discharge or axillary adenopathy.     Left: Without masses, retractions, discharge or axillary adenopathy. Gentitourinary   Inguinal/mons:  Normal without inguinal adenopathy  External genitalia:  Normal  BUS/Urethra/Skene's glands:  Normal  Vagina: Mild atrophy  Cervix:  Normal  Uterus:  normal in size, shape and contour.  Midline and mobile  Adnexa/parametria:     Rt: Without masses or tenderness.   Lt: Without masses or tenderness.  Anus and perineum: Normal  Digital rectal exam: Normal sphincter tone  without palpated masses or tenderness  Assessment/Plan:  68 y.o. MWF G3, P3 for breast and pelvic exam with no complaints.  Postmenopausal/no HRT/no bleeding 2006 LGSIL with normal Paps after History of lichen sclerosis no problem now Hypercholesteremia-primary care manages labs and meds RA-rheumatologist manages meds and DEXA  Plan: Refill of Temovate 0.05% prescription, proper use given and reviewed will use sparingly as needed.  SBEs, continue annual 3D screening mammogram, calcium rich foods, vitamin D 2000 daily encouraged.  Reviewed importance of weightbearing and balance type exercise, home safety, fall prevention discussed.  Pap normal with negative HR HPV 2018, new screening guidelines reviewed.    Harrington Challenger [facility], 1:04 PM 02/21/2019

## 2019-03-10 ENCOUNTER — Other Ambulatory Visit: Payer: Self-pay | Admitting: Rheumatology

## 2019-03-13 NOTE — Telephone Encounter (Signed)
Last Visit: 11/30/18 Next Visit :05/03/19 Labs: 02/16/19 CMP WNL. CBC stable  Okay to refill per Dr. Corliss Skains

## 2019-03-30 ENCOUNTER — Other Ambulatory Visit: Payer: Self-pay | Admitting: Rheumatology

## 2019-03-30 NOTE — Telephone Encounter (Signed)
Ok to refill folic acid 

## 2019-03-30 NOTE — Telephone Encounter (Signed)
Last visit: 11/30/18 Next visit: 05/03/19 Labs: 02/16/2019 CMP WNL. CBC stable. We will continue to monitor. Last prescribed 10/04/18 Please advise.

## 2019-04-19 NOTE — Progress Notes (Signed)
Office Visit Note  Patient: Heather Koch             Date of Birth: 06/30/1951           MRN: [id number]             PCP: Burton Apley, MD Referring: Burton Apley, MD Visit Date: 05/03/2019 Occupation: @GUAROCC @  Subjective:  Pain in both feet.      History of Present Illness: Heather Koch is a 68 y.o. female  with history of seropositive rheumatoid arthritis and osteoarthritis.  She is on methotrexate , folic acid, and sulfasalazine.  She denies missing any doses.  She denies any recent infections.  She denies any recent flares.  She denies any joint pain of swelling.  She does experience pain and stiffness in her feet.  She states in the morning it is difficult for her to walk without her shoes.  Although she has not noticed any swelling.  Activities of Daily Living:  Patient reports morning stiffness for 0 minutes.   Patient denies nocturnal pain.  Difficulty dressing/grooming: Denies Difficulty climbing stairs: Denies Difficulty getting out of chair: Denies Difficulty using hands for taps, buttons, cutlery, and/or writing: Denies  Review of Systems  Constitutional: Negative for fatigue, night sweats, weight gain and weight loss.  HENT: Negative for mouth sores, trouble swallowing, trouble swallowing, mouth dryness and nose dryness.   Eyes: Negative for pain, redness, visual disturbance and dryness.  Respiratory: Negative for cough, shortness of breath and difficulty breathing.   Cardiovascular: Negative for chest pain, palpitations, hypertension, irregular heartbeat and swelling in legs/feet.  Gastrointestinal: Negative for abdominal pain, blood in stool, constipation and diarrhea.  Endocrine: Negative for increased urination.  Genitourinary: Negative for vaginal dryness.  Musculoskeletal: Negative for arthralgias, joint pain, joint swelling, myalgias, muscle weakness, morning stiffness, muscle tenderness and myalgias.  Skin: Negative for color change, rash, hair loss,  redness, skin tightness, ulcers and sensitivity to sunlight.  Allergic/Immunologic: Negative for susceptible to infections.  Neurological: Positive for headaches. Negative for dizziness, memory loss, night sweats and weakness.  Hematological: Negative for swollen glands.  Psychiatric/Behavioral: Negative for depressed mood and sleep disturbance. The patient is nervous/anxious.     PMFS History:  Patient Active Problem List   Diagnosis Date Noted  . Abnormal lung sounds 04/12/2017  . Rheumatoid arthritis involving multiple sites with positive rheumatoid factor (HCC) 04/07/2017  . High risk medication use 04/07/2017  . Primary osteoarthritis of both hands 04/07/2017  . Vitamin D deficiency 04/07/2017  . History of Granuloma annulare 04/07/2017  . Osteopenia of multiple sites 04/07/2017  . ANXIETY 01/31/2008  . CONTACT DERMATITIS&OTHER ECZEMA DUE TO PLANTS 01/31/2008  . ALLERGIC RHINITIS 06/29/2007  . COLONIC POLYPS, HX OF 06/29/2007    Past Medical History:  Diagnosis Date  . ASCUS (atypical squamous cells of undetermined significance) on Pap smear 12/2008   neg HR HPV  . LGSIL (low grade squamous intraepithelial dysplasia) 2005/2006  . Lichen sclerosus 2012  . Rheumatoid arthritis(714.0) 2012    Family History  Problem Relation Age of Onset  . Hypertension Mother   . Diabetes Mother   . Cancer Father        liver  . Heart disease Sister   . Cancer Brother        pancreatic cancer  . Cancer Maternal Aunt        throat  . Heart disease Sister   . Heart disease Sister   . Cancer Maternal Uncle  Colon  . Healthy Son   . Healthy Son   . Healthy Daughter    Past Surgical History:  Procedure Laterality Date  . TUBAL LIGATION  1997   Social History   Social History Narrative  . Not on file    There is no immunization history on file for this patient.   Objective: Vital Signs: BP 135/70 (BP Location: Left Arm, Patient Position: Sitting, Cuff Size: Normal)    Pulse 69   Resp 12   Ht 5\' 4"  (1.626 m)   Wt 146 lb 9.6 oz (66.5 kg)   LMP 03/31/2003   BMI 25.16 kg/m    Physical Exam Vitals signs and nursing note reviewed.  Constitutional:      Appearance: She is well-developed.  HENT:     Head: Normocephalic and atraumatic.  Eyes:     Conjunctiva/sclera: Conjunctivae normal.  Neck:     Musculoskeletal: Normal range of motion.  Cardiovascular:     Rate and Rhythm: Normal rate and regular rhythm.     Heart sounds: Normal heart sounds.  Pulmonary:     Effort: Pulmonary effort is normal.     Breath sounds: Normal breath sounds.  Abdominal:     General: Bowel sounds are normal.     Palpations: Abdomen is soft.  Lymphadenopathy:     Cervical: No cervical adenopathy.  Skin:    General: Skin is warm and dry.     Capillary Refill: Capillary refill takes less than 2 seconds.  Neurological:     Mental Status: She is alert and oriented to person, place, and time.  Psychiatric:        Behavior: Behavior normal.      Musculoskeletal Exam: C-spine was in good range of motion.  She has thoracolumbar scoliosis with good flexion in her spine.  Shoulder joints, elbow joints, wrist joints, MCPs PIPs and DIPs with good range of motion.  She is some synovial thickening over MCP joints with no synovitis.  Hip joints, knee joints, ankles were in good range of motion.  She is some thickening of the MTPs and PIPs with no synovitis.  CDAI Exam: CDAI Score: - Patient Global: 3 mm; Provider Global: - Swollen: 0 ; Tender: 0  Joint Exam   No joint exam has been documented for this visit   There is currently no information documented on the homunculus. Go to the Rheumatology activity and complete the homunculus joint exam.  Investigation: No additional findings.  Imaging: No results found.  Recent Labs: Lab Results  Component Value Date   WBC 5.5 02/16/2019   HGB 11.8 02/16/2019   PLT 216 02/16/2019   NA 140 02/16/2019   K 4.0 02/16/2019   CL  104 02/16/2019   CO2 28 02/16/2019   GLUCOSE 88 02/16/2019   BUN 10 02/16/2019   CREATININE 0.73 02/16/2019   BILITOT 0.5 02/16/2019   ALKPHOS 67 05/31/2017   AST 18 02/16/2019   ALT 11 02/16/2019   PROT 7.1 02/16/2019   ALBUMIN 4.0 05/31/2017   CALCIUM 9.5 02/16/2019   GFRAA 99 02/16/2019    Speciality Comments: No specialty comments available.  Procedures:  No procedures performed Allergies: Patient has no known allergies.   Assessment / Plan:     Visit Diagnoses: Rheumatoid arthritis involving multiple sites with positive rheumatoid factor (HCC) - erosive disease with +CCP +RF -ANA.  -Patient is clinically doing well on combination of methotrexate and sulfasalazine combination.  She had no synovitis on examination.  Although  she continues to have some discomfort in her feet due to chronic damage.  Prescription refill for methotrexate was given today.  Plan: Methotrexate Sodium (METHOTREXATE, PF,) 250 MG/10ML injection,   High risk medication use -  Methotrexate 0.8 ml every 7 days, and folic acid 1 mg 2 tablets daily. Sulfasalazine 500 mg 2 tablets twice daily.  Most recent CMP within normal limits and CBC stable on 02/16/2019.  Due for CBC/CMP in August and will monitor every 3 months.  Standing orders are in place. Recommend annual influenza, Pneumovax 23, Prevnar 13, and Shingrix as indicated for immunosuppressant therapy. -We will check labs again in August and then every 3 months to monitor for drug toxicity.  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  Primary osteoarthritis of both hands -joint protection was discussed.  Chronic midline low back pain without sciatica -patient has scoliosis and has some discomfort from that.  Osteopenia of multiple sites - T score of -2.5 on DEXA done on 12/15/2015. She previously declined starting Fosamax. -Patient states he had to postpone her DEXA scan until October 2020 due to the pandemic.  Granuloma  annulare-followed up by dermatology.  History of vitamin D deficiency-she is on vitamin D supplement.  History of colon polyps  History of anxiety -she takes Xanax on PRN basis.  Orders: No orders of the defined types were placed in this encounter.  Meds ordered this encounter  Medications  . Methotrexate Sodium (METHOTREXATE, PF,) 250 MG/10ML injection    Sig: INJECT 0.8MLS INTO THE SKIN ONCE A WEEK    Dispense:  10 mL    Refill:  0      Follow-Up Instructions: Return in about 5 months (around 10/03/2019) for Rheumatoid arthritis.   Bo Merino, MD  Note - This record has been created using Editor, commissioning.  Chart creation errors have been sought, but may not always  have been located. Such creation errors do not reflect on  the standard of medical care.

## 2019-05-03 ENCOUNTER — Ambulatory Visit (INDEPENDENT_AMBULATORY_CARE_PROVIDER_SITE_OTHER): Payer: Medicare Other | Admitting: Rheumatology

## 2019-05-03 ENCOUNTER — Encounter: Payer: Self-pay | Admitting: Rheumatology

## 2019-05-03 ENCOUNTER — Other Ambulatory Visit: Payer: Self-pay

## 2019-05-03 VITALS — BP 135/70 | HR 69 | Resp 12 | Ht 64.0 in | Wt 146.6 lb

## 2019-05-03 DIAGNOSIS — M545 Low back pain, unspecified: Secondary | ICD-10-CM

## 2019-05-03 DIAGNOSIS — M0579 Rheumatoid arthritis with rheumatoid factor of multiple sites without organ or systems involvement: Secondary | ICD-10-CM

## 2019-05-03 DIAGNOSIS — M19042 Primary osteoarthritis, left hand: Secondary | ICD-10-CM

## 2019-05-03 DIAGNOSIS — M19041 Primary osteoarthritis, right hand: Secondary | ICD-10-CM | POA: Diagnosis not present

## 2019-05-03 DIAGNOSIS — Z79899 Other long term (current) drug therapy: Secondary | ICD-10-CM | POA: Diagnosis not present

## 2019-05-03 DIAGNOSIS — M0630 Rheumatoid nodule, unspecified site: Secondary | ICD-10-CM | POA: Diagnosis not present

## 2019-05-03 DIAGNOSIS — M8589 Other specified disorders of bone density and structure, multiple sites: Secondary | ICD-10-CM

## 2019-05-03 DIAGNOSIS — G8929 Other chronic pain: Secondary | ICD-10-CM

## 2019-05-03 MED ORDER — METHOTREXATE SODIUM (PF) CHEMO INJECTION 250 MG/10ML: INTRAMUSCULAR | 0 refills | Status: DC

## 2019-05-03 NOTE — Patient Instructions (Signed)
Standing Labs We placed an order today for your standing lab work.    Please come back and get your standing labs in August and then every 3 months.  We have open lab daily Monday through Thursday from 8:30-12:30 PM and 1:30-4:30 PM and Friday from 8:30-12:30 PM and 1:30 -4:00 PM at the office of Dr. Shaili Deveshwar.   You may experience shorter wait times on Monday and Friday afternoons. The office is located at [address], [address], [place], [place] [address] No appointment is necessary.   Labs are drawn by [facility].  You may receive a bill from [facility] for your lab work.  If you wish to have your labs drawn at another location, please call the office 24 hours in advance to send orders.  If you have any questions regarding directions or hours of operation,  please call [phone].   Just as a reminder please drink plenty of water prior to coming for your lab work. Thanks!  Vaccines You are taking a medication(s) that can suppress your immune system.  The following immunizations are recommended: . Flu annually . Pneumonia (Pneumovax 23 and Prevnar 13 spaced at least 1 year apart) . Shingrix  Please check with your PCP to make sure you are up to date.   

## 2019-05-14 ENCOUNTER — Other Ambulatory Visit: Payer: Self-pay | Admitting: Rheumatology

## 2019-05-14 DIAGNOSIS — M0579 Rheumatoid arthritis with rheumatoid factor of multiple sites without organ or systems involvement: Secondary | ICD-10-CM

## 2019-05-14 NOTE — Telephone Encounter (Signed)
Last Visit: 05/03/19 Next visit: 10/09/19 Labs: 02/16/19 CMP WNL. CBC stable.  Okay to refill per Dr. Estanislado Pandy

## 2019-06-08 ENCOUNTER — Other Ambulatory Visit: Payer: Self-pay

## 2019-06-08 ENCOUNTER — Other Ambulatory Visit: Payer: Self-pay | Admitting: Rheumatology

## 2019-06-08 DIAGNOSIS — Z79899 Other long term (current) drug therapy: Secondary | ICD-10-CM

## 2019-06-08 NOTE — Telephone Encounter (Signed)
Patient had labs drawn today, 06/08/2019.   Okay to refill per Dr. Estanislado Pandy.

## 2019-06-08 NOTE — Telephone Encounter (Addendum)
Last Visit: 05/03/2019 Next Visit: 10/09/2019 Labs: 02/16/2019 CMP WNL. CBC stable. We will continue to monitor.  Advised patient she is due to update labs, patient will come to the office today to update labs. Per patient's request, wait until she has labs drawn to refill, so that a 90 day supply can be refilled.

## 2019-06-09 LAB — CBC WITH DIFFERENTIAL/PLATELET
Absolute Monocytes: 554 cells/uL (ref 200–950)
Basophils Absolute: 32 cells/uL (ref 0–200)
Basophils Relative: 0.5 %
Eosinophils Absolute: 113 cells/uL (ref 15–500)
Eosinophils Relative: 1.8 %
HCT: 35 % (ref 35.0–45.0)
Hemoglobin: 11.6 g/dL — ABNORMAL LOW (ref 11.7–15.5)
Lymphs Abs: 2186 cells/uL (ref 850–3900)
MCH: 32.9 pg (ref 27.0–33.0)
MCHC: 33.1 g/dL (ref 32.0–36.0)
MCV: 99.2 fL (ref 80.0–100.0)
MPV: 11.4 fL (ref 7.5–12.5)
Monocytes Relative: 8.8 %
Neutro Abs: 3415 cells/uL (ref 1500–7800)
Neutrophils Relative %: 54.2 %
Platelets: 203 10*3/uL (ref 140–400)
RBC: 3.53 10*6/uL — ABNORMAL LOW (ref 3.80–5.10)
RDW: 13.1 % (ref 11.0–15.0)
Total Lymphocyte: 34.7 %
WBC: 6.3 10*3/uL (ref 3.8–10.8)

## 2019-06-09 LAB — COMPLETE METABOLIC PANEL WITH GFR
AG Ratio: 1.4 (calc) (ref 1.0–2.5)
ALT: 13 U/L (ref 6–29)
AST: 16 U/L (ref 10–35)
Albumin: 4 g/dL (ref 3.6–5.1)
Alkaline phosphatase (APISO): 61 U/L (ref 37–153)
BUN: 14 mg/dL (ref 7–25)
CO2: 29 mmol/L (ref 20–32)
Calcium: 9.3 mg/dL (ref 8.6–10.4)
Chloride: 103 mmol/L (ref 98–110)
Creat: 0.66 mg/dL (ref 0.50–0.99)
GFR, Est African American: 107 mL/min/{1.73_m2} (ref >=60)
GFR, Est Non African American: 92 mL/min/{1.73_m2} (ref >=60)
Globulin: 2.9 g/dL (calc) (ref 1.9–3.7)
Glucose, Bld: 76 mg/dL (ref 65–99)
Potassium: 4.1 mmol/L (ref 3.5–5.3)
Sodium: 138 mmol/L (ref 135–146)
Total Bilirubin: 0.4 mg/dL (ref 0.2–1.2)
Total Protein: 6.9 g/dL (ref 6.1–8.1)

## 2019-06-11 NOTE — Progress Notes (Signed)
Mild anemia noted.  Please forward labs to her PCP.

## 2019-07-02 ENCOUNTER — Other Ambulatory Visit: Payer: Self-pay | Admitting: Rheumatology

## 2019-07-02 DIAGNOSIS — M0579 Rheumatoid arthritis with rheumatoid factor of multiple sites without organ or systems involvement: Secondary | ICD-10-CM

## 2019-07-02 NOTE — Telephone Encounter (Signed)
Last Visit: 05/03/19 Next visit: 10/09/19 Labs: 06/08/19 Mild anemia noted.  Okay to refill per Dr. Estanislado Pandy

## 2019-07-17 ENCOUNTER — Telehealth: Payer: Self-pay | Admitting: Rheumatology

## 2019-07-17 NOTE — Telephone Encounter (Signed)
Patient request a copy of her last lab results to pick up this morning. Patient wants to pick up by 11:00a for another appt.

## 2019-07-17 NOTE — Telephone Encounter (Signed)
Patient advised lab results are ready for pick up.

## 2019-07-25 ENCOUNTER — Encounter: Payer: Self-pay | Admitting: Gynecology

## 2019-08-17 ENCOUNTER — Encounter: Payer: Self-pay | Admitting: Rheumatology

## 2019-08-17 ENCOUNTER — Encounter: Payer: Self-pay | Admitting: Gynecology

## 2019-08-30 ENCOUNTER — Other Ambulatory Visit: Payer: Self-pay | Admitting: *Deleted

## 2019-08-30 ENCOUNTER — Telehealth: Payer: Self-pay | Admitting: *Deleted

## 2019-08-30 DIAGNOSIS — Z79899 Other long term (current) drug therapy: Secondary | ICD-10-CM

## 2019-08-30 NOTE — Telephone Encounter (Signed)
Attempted to contact the patient and left message for patient to call the office.  

## 2019-08-30 NOTE — Telephone Encounter (Signed)
Received DEXA results from [facility].  Date of Scan: 08/17/19 Lowest T-score and site measured: -3.2 1/3 left distal radius Significant changes in BMD and site measured (5% and above): 0.564 -11% Left 1/3 radius   Current Regimen: No current treatment. Patient has previously declined Fosamax.   Recommendation: Schedule appointment to discuss results and treatment options.

## 2019-08-31 LAB — COMPLETE METABOLIC PANEL WITH GFR
AG Ratio: 1.4 (calc) (ref 1.0–2.5)
ALT: 13 U/L (ref 6–29)
AST: 16 U/L (ref 10–35)
Albumin: 4.1 g/dL (ref 3.6–5.1)
Alkaline phosphatase (APISO): 57 U/L (ref 37–153)
BUN: 13 mg/dL (ref 7–25)
CO2: 28 mmol/L (ref 20–32)
Calcium: 9.6 mg/dL (ref 8.6–10.4)
Chloride: 104 mmol/L (ref 98–110)
Creat: 0.66 mg/dL (ref 0.50–0.99)
GFR, Est African American: 106 mL/min/{1.73_m2} (ref >=60)
GFR, Est Non African American: 91 mL/min/{1.73_m2} (ref >=60)
Globulin: 2.9 g/dL (calc) (ref 1.9–3.7)
Glucose, Bld: 87 mg/dL (ref 65–99)
Potassium: 4.1 mmol/L (ref 3.5–5.3)
Sodium: 140 mmol/L (ref 135–146)
Total Bilirubin: 0.4 mg/dL (ref 0.2–1.2)
Total Protein: 7 g/dL (ref 6.1–8.1)

## 2019-08-31 LAB — CBC WITH DIFFERENTIAL/PLATELET
Absolute Monocytes: 621 cells/uL (ref 200–950)
Basophils Absolute: 29 cells/uL (ref 0–200)
Basophils Relative: 0.5 %
Eosinophils Absolute: 93 cells/uL (ref 15–500)
Eosinophils Relative: 1.6 %
HCT: 33.9 % — ABNORMAL LOW (ref 35.0–45.0)
Hemoglobin: 11.5 g/dL — ABNORMAL LOW (ref 11.7–15.5)
Lymphs Abs: 1902 cells/uL (ref 850–3900)
MCH: 33.4 pg — ABNORMAL HIGH (ref 27.0–33.0)
MCHC: 33.9 g/dL (ref 32.0–36.0)
MCV: 98.5 fL (ref 80.0–100.0)
MPV: 12.2 fL (ref 7.5–12.5)
Monocytes Relative: 10.7 %
Neutro Abs: 3155 cells/uL (ref 1500–7800)
Neutrophils Relative %: 54.4 %
Platelets: 188 10*3/uL (ref 140–400)
RBC: 3.44 10*6/uL — ABNORMAL LOW (ref 3.80–5.10)
RDW: 12.9 % (ref 11.0–15.0)
Total Lymphocyte: 32.8 %
WBC: 5.8 10*3/uL (ref 3.8–10.8)

## 2019-08-31 NOTE — Progress Notes (Signed)
Labs are stable.

## 2019-09-12 ENCOUNTER — Telehealth: Payer: Self-pay

## 2019-09-12 MED ORDER — SULFASALAZINE 500 MG PO TBEC: 1000.0000 mg | DELAYED_RELEASE_TABLET | Freq: Two times a day (BID) | ORAL | 0 refills | Status: DC

## 2019-09-12 NOTE — Telephone Encounter (Signed)
Refill request received via fax from [facility] [address] for [facility].   Last Visit: 05/03/2019 Next Visit: 10/09/2019 Labs: 08/30/2019 stable   Okay to refill per Dr. Estanislado Pandy.

## 2019-10-04 ENCOUNTER — Telehealth: Payer: Self-pay | Admitting: Rheumatology

## 2019-10-04 DIAGNOSIS — M0579 Rheumatoid arthritis with rheumatoid factor of multiple sites without organ or systems involvement: Secondary | ICD-10-CM

## 2019-10-04 MED ORDER — METHOTREXATE SODIUM (PF) CHEMO INJECTION 250 MG/10ML: INTRAMUSCULAR | 0 refills | Status: DC

## 2019-10-04 MED ORDER — "TUBERCULIN SYRINGE 27G X 1/2"" 1 ML MISC": 2 refills | Status: DC

## 2019-10-04 MED ORDER — FOLIC ACID 1 MG PO TABS: 2.0000 mg | ORAL_TABLET | Freq: Every day | ORAL | 2 refills | Status: DC

## 2019-10-04 NOTE — Telephone Encounter (Signed)
Last Visit: 05/03/2019 Next Visit: 11/28/2019 Labs: 08/30/2019 Labs are stable  Okay to refill per Dr. Estanislado Pandy.

## 2019-10-04 NOTE — Telephone Encounter (Signed)
Patient called requesting prescription refills of Methotrexate, Tuberculin Syringes, and Folic Acid to be sent to [place] [facility].

## 2019-10-09 ENCOUNTER — Ambulatory Visit: Payer: Medicare Other | Admitting: Rheumatology

## 2019-11-26 NOTE — Progress Notes (Signed)
Office Visit Note  Patient: Heather Koch             Date of Birth: [birth year]           MRN: [id number]             PCP: Burton Apley, MD Referring: Burton Apley, MD Visit Date: 11/28/2019 Occupation: @GUAROCC @  Subjective:  Right shoulder joint pain   History of Present Illness: Heather Koch is a 69 y.o. female with history of seropositive rheumatoid arthritis and osteoarthritis.  She is taking sulfasalazine 500 mg 2 tablets twice daily, methotrexate 0.8 mL once weekly, folic acid 2 mg by mouth daily.  She presents today with neck pain and radiating pain down the right upper extremity, which started in December 2020.  She denies any injuries or activities that brought on the discomfort.  She denies any numbness or tingling in the right UE.  She has been using voltaren gel topically as needed for pain relief.  She states the pain has started to improve over time. She has intermittent pain in both hands but no joint swelling.  She has intermittent right trochanteric bursitis. She denies any other joint pain or joint swelling at this time.     Activities of Daily Living:  Patient reports morning stiffness for less than 5 minutes.   Patient Reports nocturnal pain.  Difficulty dressing/grooming: Denies Difficulty climbing stairs: Denies Difficulty getting out of chair: Denies Difficulty using hands for taps, buttons, cutlery, and/or writing: Reports  Review of Systems  Constitutional: Positive for fatigue.  HENT: Positive for nose dryness. Negative for mouth sores and mouth dryness.   Eyes: Negative for pain, itching, visual disturbance and dryness.  Respiratory: Negative for cough, hemoptysis, shortness of breath and difficulty breathing.   Cardiovascular: Negative for chest pain, palpitations, hypertension and swelling in legs/feet.  Gastrointestinal: Negative for blood in stool, constipation and diarrhea.  Endocrine: Negative for increased urination.  Genitourinary: Negative  for difficulty urinating and painful urination.  Musculoskeletal: Positive for arthralgias, joint pain, joint swelling and morning stiffness. Negative for myalgias, muscle weakness, muscle tenderness and myalgias.  Skin: Negative for color change, pallor, rash, hair loss, nodules/bumps, skin tightness, ulcers and sensitivity to sunlight.  Allergic/Immunologic: Negative for susceptible to infections.  Neurological: Negative for dizziness, numbness, headaches, memory loss and weakness.  Hematological: Negative for bruising/bleeding tendency and swollen glands.  Psychiatric/Behavioral: Negative for depressed mood, confusion and sleep disturbance. The patient is not nervous/anxious.     PMFS History:  Patient Active Problem List   Diagnosis Date Noted  . Abnormal lung sounds 04/12/2017  . Rheumatoid arthritis involving multiple sites with positive rheumatoid factor (HCC) 04/07/2017  . High risk medication use 04/07/2017  . Primary osteoarthritis of both hands 04/07/2017  . Vitamin D deficiency 04/07/2017  . History of Granuloma annulare 04/07/2017  . Osteopenia of multiple sites 04/07/2017  . ANXIETY 01/31/2008  . CONTACT DERMATITIS&OTHER ECZEMA DUE TO PLANTS 01/31/2008  . ALLERGIC RHINITIS 06/29/2007  . COLONIC POLYPS, HX OF 06/29/2007    Past Medical History:  Diagnosis Date  . ASCUS (atypical squamous cells of undetermined significance) on Pap smear 12/2008   neg HR HPV  . LGSIL (low grade squamous intraepithelial dysplasia) 2005/2006  . Lichen sclerosus 2012  . Rheumatoid arthritis(714.0) 2012    Family History  Problem Relation Age of Onset  . Hypertension Mother   . Diabetes Mother   . Cancer Father        liver  .  Heart disease Sister   . Cancer Brother        pancreatic cancer  . Cancer Maternal Aunt        throat  . Heart disease Sister   . Heart disease Sister   . Cancer Maternal Uncle        Colon  . Heart disease Brother        pacemaker  . Healthy Son   .  Healthy Son   . Healthy Daughter    Past Surgical History:  Procedure Laterality Date  . TUBAL LIGATION  1997   Social History   Social History Narrative  . Not on file    There is no immunization history on file for this patient.   Objective: Vital Signs: BP 136/75 (BP Location: Left Arm, Patient Position: Sitting, Cuff Size: Normal)   Pulse 78   Resp 13   Ht 5\' 3"  (1.6 m)   Wt 153 lb (69.4 kg)   LMP 03/31/2003   BMI 27.10 kg/m    Physical Exam Vitals and nursing note reviewed.  Constitutional:      Appearance: She is well-developed.  HENT:     Head: Normocephalic and atraumatic.  Eyes:     Conjunctiva/sclera: Conjunctivae normal.  Cardiovascular:     Rate and Rhythm: Normal rate and regular rhythm.     Heart sounds: Normal heart sounds.  Pulmonary:     Effort: Pulmonary effort is normal.     Breath sounds: Normal breath sounds.  Abdominal:     General: Bowel sounds are normal.     Palpations: Abdomen is soft.  Musculoskeletal:     Cervical back: Normal range of motion.  Lymphadenopathy:     Cervical: No cervical adenopathy.  Skin:    General: Skin is warm and dry.     Capillary Refill: Capillary refill takes less than 2 seconds.  Neurological:     Mental Status: She is alert and oriented to person, place, and time.  Psychiatric:        Behavior: Behavior normal.      Musculoskeletal Exam: C-spine good ROM with no discomfort.  Thoracic and lumbar spine good ROM.  Shoulder joints, elbow joints, wrist joints, MCPs PIPs, and DIPs good ROM with no synovitis.  Ulnar deviation bilaterally.  MCP synovial thickening but no synovitis.  DIP joint synovial thickening consistent with osteoarthritis of both hands.  Hip joints, knee joints, ankle joints, MTPs, PIPs, and DIPs good ROM with no synovitis.  No warmth or effusion of knee joints.  No tenderness or swelling of ankle joints.  No tenderness of MTP joints. She has tenderness over the right trochanteric bursa.    CDAI Exam: CDAI Score: -- Patient Global: --; Provider Global: -- Swollen: --; Tender: -- Joint Exam 11/28/2019   No joint exam has been documented for this visit   There is currently no information documented on the homunculus. Go to the Rheumatology activity and complete the homunculus joint exam.  Investigation: No additional findings.  Imaging: No results found.  Recent Labs: Lab Results  Component Value Date   WBC 5.8 08/30/2019   HGB 11.5 (L) 08/30/2019   PLT 188 08/30/2019   NA 140 08/30/2019   K 4.1 08/30/2019   CL 104 08/30/2019   CO2 28 08/30/2019   GLUCOSE 87 08/30/2019   BUN 13 08/30/2019   CREATININE 0.66 08/30/2019   BILITOT 0.4 08/30/2019   ALKPHOS 67 05/31/2017   AST 16 08/30/2019   ALT 13 08/30/2019  PROT 7.0 08/30/2019   ALBUMIN 4.0 05/31/2017   CALCIUM 9.6 08/30/2019   GFRAA 106 08/30/2019    Speciality Comments: No specialty comments available.  Procedures:  No procedures performed Allergies: Fish allergy    Assessment / Plan:     Visit Diagnoses: Rheumatoid arthritis involving multiple sites with positive rheumatoid factor (HCC) - erosive disease with +CCP +RF -ANA: She has no synovitis on exam.  She has intermittent discomfort in both hands.  She has no tenderness or inflammation on exam. Ulnar deviation and synovial thickening of all MCPs noted bilaterally.  She is clinically doing well on combination therapy of MTX 0.8 ml sq once weekly, folic acid 2 mg po daily, and sulfasalazine 500 mg 2 tablets BID.  X-rays of both hands were obtained on 06/28/2018 which did not reveal any radiographic progression when compared to images from 2017.  She will continue on the current treatment regimen.  She has advised to notify us if she develops increased joint pain or joint swelling. She will follow up in 5 months  High risk medication use - Methotrexate 0.8 ml every 7 days, and folic acid 1 mg 2 tablets daily. Sulfasalazine 500 mg 2 tablets twice  daily.  CBC and CMP were drawn today to monitor for drug toxicity.- Plan: CBC with Differential/Platelet, COMPLETE METABOLIC PANEL WITH GFR  Rheumatoid nodulosis (HCC): Resolved   Primary osteoarthritis of both hands: She has PIP and DIP synovial thickening consistent with osteoarthritis.  She has intermittent pain in both hands but no joint swelling.  No synovitis was noted on exam. Joint protection and muscle strengthening were discussed. X-rays of both hands were obtained on 06/28/18.  Findings were consistent with RA and OA overlap but did not reveal any progression when compared to images in 2017.   Pes anserine bursitis: Resolved   Trochanteric bursitis, right hip: She has tenderness over the right trochanter bursa on exam.  We discussed the importance of performing stretching exercises on a daily basis.  She was given a handout of these exercises to perform.  Neck pain: She has been experiencing increased neck pain since December 2020.  She denies any injuries or activities that led to her neck pain.  She is been experiencing radiating pain down the right upper extremity.  She has good range of motion of the C-spine.  She has no symptoms of radiculopathy at this time.  She was given a handout of neck exercises to perform.  Chronic right shoulder pain: She has good range of motion of the right shoulder joint on exam.  She has some discomfort with internal rotation.  She was given a handout of shoulder joint exercises to perform.  Osteopenia of multiple sites -  T score of -2.5 on DEXA done on 12/15/2015. She previously declined starting Fosamax.  Future order for DEXA has been in place.   History of vitamin D deficiency  Granuloma annulare  History of colon polyps  History of anxiety  Orders: Orders Placed This Encounter  Procedures  . CBC with Differential/Platelet  . COMPLETE METABOLIC PANEL WITH GFR   No orders of the defined types were placed in this  encounter.   Face-to-face time spent with patient was 30 minutes. Greater than 50% of time was spent in counseling and coordination of care.  Follow-Up Instructions: Return in about 5 months (around 04/26/2020) for Rheumatoid arthritis, Osteoarthritis.   Gearldine Bienenstock, PA-C   I examined and evaluated the patient with Sherron Ales PA.  Patient  has underlying osteoarthritis and rheumatoid arthritis.  No synovitis was noted.  She has significant ulnar deviation.  We will continue to monitor labs.  The plan of care was discussed as noted above.  Bo Merino, MD  Note - This record has been created using Editor, commissioning.  Chart creation errors have been sought, but may not always  have been located. Such creation errors do not reflect on  the standard of medical care.

## 2019-11-28 ENCOUNTER — Other Ambulatory Visit: Payer: Self-pay

## 2019-11-28 ENCOUNTER — Ambulatory Visit: Payer: Medicare Other | Admitting: Rheumatology

## 2019-11-28 ENCOUNTER — Encounter: Payer: Self-pay | Admitting: Rheumatology

## 2019-11-28 VITALS — BP 136/75 | HR 78 | Resp 13 | Ht 63.0 in | Wt 153.0 lb

## 2019-11-28 DIAGNOSIS — Z8659 Personal history of other mental and behavioral disorders: Secondary | ICD-10-CM

## 2019-11-28 DIAGNOSIS — M0630 Rheumatoid nodule, unspecified site: Secondary | ICD-10-CM

## 2019-11-28 DIAGNOSIS — M8589 Other specified disorders of bone density and structure, multiple sites: Secondary | ICD-10-CM

## 2019-11-28 DIAGNOSIS — M0579 Rheumatoid arthritis with rheumatoid factor of multiple sites without organ or systems involvement: Secondary | ICD-10-CM | POA: Diagnosis not present

## 2019-11-28 DIAGNOSIS — Z79899 Other long term (current) drug therapy: Secondary | ICD-10-CM | POA: Diagnosis not present

## 2019-11-28 DIAGNOSIS — M25511 Pain in right shoulder: Secondary | ICD-10-CM

## 2019-11-28 DIAGNOSIS — M19041 Primary osteoarthritis, right hand: Secondary | ICD-10-CM

## 2019-11-28 DIAGNOSIS — M7050 Other bursitis of knee, unspecified knee: Secondary | ICD-10-CM

## 2019-11-28 DIAGNOSIS — Z8639 Personal history of other endocrine, nutritional and metabolic disease: Secondary | ICD-10-CM

## 2019-11-28 DIAGNOSIS — M7061 Trochanteric bursitis, right hip: Secondary | ICD-10-CM

## 2019-11-28 DIAGNOSIS — Z86010 Personal history of colonic polyps: Secondary | ICD-10-CM

## 2019-11-28 DIAGNOSIS — M19042 Primary osteoarthritis, left hand: Secondary | ICD-10-CM

## 2019-11-28 DIAGNOSIS — L920 Granuloma annulare: Secondary | ICD-10-CM

## 2019-11-28 DIAGNOSIS — G8929 Other chronic pain: Secondary | ICD-10-CM

## 2019-11-28 DIAGNOSIS — M542 Cervicalgia: Secondary | ICD-10-CM

## 2019-11-28 LAB — COMPLETE METABOLIC PANEL WITH GFR
AG Ratio: 1.5 (calc) (ref 1.0–2.5)
ALT: 11 U/L (ref 6–29)
AST: 18 U/L (ref 10–35)
Albumin: 4.4 g/dL (ref 3.6–5.1)
Alkaline phosphatase (APISO): 58 U/L (ref 37–153)
BUN: 10 mg/dL (ref 7–25)
CO2: 31 mmol/L (ref 20–32)
Calcium: 9.9 mg/dL (ref 8.6–10.4)
Chloride: 102 mmol/L (ref 98–110)
Creat: 0.64 mg/dL (ref 0.50–0.99)
GFR, Est African American: 107 mL/min/{1.73_m2} (ref >=60)
GFR, Est Non African American: 92 mL/min/{1.73_m2} (ref >=60)
Globulin: 3 g/dL (calc) (ref 1.9–3.7)
Glucose, Bld: 107 mg/dL — ABNORMAL HIGH (ref 65–99)
Potassium: 3.9 mmol/L (ref 3.5–5.3)
Sodium: 139 mmol/L (ref 135–146)
Total Bilirubin: 0.4 mg/dL (ref 0.2–1.2)
Total Protein: 7.4 g/dL (ref 6.1–8.1)

## 2019-11-28 LAB — CBC WITH DIFFERENTIAL/PLATELET
Absolute Monocytes: 531 cells/uL (ref 200–950)
Basophils Absolute: 30 cells/uL (ref 0–200)
Basophils Relative: 0.5 %
Eosinophils Absolute: 71 cells/uL (ref 15–500)
Eosinophils Relative: 1.2 %
HCT: 36.5 % (ref 35.0–45.0)
Hemoglobin: 12.4 g/dL (ref 11.7–15.5)
Lymphs Abs: 2083 cells/uL (ref 850–3900)
MCH: 33.7 pg — ABNORMAL HIGH (ref 27.0–33.0)
MCHC: 34 g/dL (ref 32.0–36.0)
MCV: 99.2 fL (ref 80.0–100.0)
MPV: 11.3 fL (ref 7.5–12.5)
Monocytes Relative: 9 %
Neutro Abs: 3186 cells/uL (ref 1500–7800)
Neutrophils Relative %: 54 %
Platelets: 204 10*3/uL (ref 140–400)
RBC: 3.68 10*6/uL — ABNORMAL LOW (ref 3.80–5.10)
RDW: 13 % (ref 11.0–15.0)
Total Lymphocyte: 35.3 %
WBC: 5.9 10*3/uL (ref 3.8–10.8)

## 2019-11-28 NOTE — Patient Instructions (Addendum)
Standing Labs We placed an order today for your standing lab work.    Please come back and get your standing labs in May and every 3 months   We have open lab daily Monday through Thursday from 8:30-12:30 PM and 1:30-4:30 PM and Friday from 8:30-12:30 PM and 1:30-4:00 PM at the office of Dr. Pollyann Savoy.   You may experience shorter wait times on Monday and Friday afternoons. The office is located at [address], [place], [place] [address] No appointment is necessary.   Labs are drawn by [facility].  You may receive a bill from [facility] for your lab work.  If you wish to have your labs drawn at another location, please call the office 24 hours in advance to send orders.  If you have any questions regarding directions or hours of operation,  please call [phone].   Just as a reminder please drink plenty of water prior to coming for your lab work. Thanks!    Hip Bursitis Rehab Ask your health care provider which exercises are safe for you. Do exercises exactly as told by your health care provider and adjust them as directed. It is normal to feel mild stretching, pulling, tightness, or discomfort as you do these exercises. Stop right away if you feel sudden pain or your pain gets worse. Do not begin these exercises until told by your health care provider. Stretching exercise This exercise warms up your muscles and joints and improves the movement and flexibility of your hip. This exercise also helps to relieve pain and stiffness. Iliotibial band stretch An iliotibial band is a strong band of muscle tissue that runs from the outer side of your hip to the outer side of your thigh and knee. 1. Lie on your side with your left / right leg in the top position. 2. Bend your left / right knee and grab your ankle. Stretch out your bottom arm to help you balance. 3. Slowly bring your knee back so your thigh is behind your body. 4. Slowly lower your knee toward the floor until you  feel a gentle stretch on the outside of your left / right thigh. If you do not feel a stretch and your knee will not fall farther, place the heel of your other foot on top of your knee and pull your knee down toward the floor with your foot. 5. Hold this position for __________ seconds. 6. Slowly return to the starting position. Repeat __________ times. Complete this exercise __________ times a day. Strengthening exercises These exercises build strength and endurance in your hip and pelvis. Endurance is the ability to use your muscles for a long time, even after they get tired. Bridge This exercise strengthens the muscles that move your thigh backward (hip extensors). 1. Lie on your back on a firm surface with your knees bent and your feet flat on the floor. 2. Tighten your buttocks muscles and lift your buttocks off the floor until your trunk is level with your thighs. ? Do not arch your back. ? You should feel the muscles working in your buttocks and the back of your thighs. If you do not feel these muscles, slide your feet 1-2 inches (2.5-5 cm) farther away from your buttocks. ? If this exercise is too easy, try doing it with your arms crossed over your chest. 3. Hold this position for __________ seconds. 4. Slowly lower your hips to the starting position. 5. Let your muscles relax completely after each repetition. Repeat __________  times. Complete this exercise __________ times a day. Squats This exercise strengthens the muscles in front of your thigh and knee (quadriceps). 1. Stand in front of a table, with your feet and knees pointing straight ahead. You may rest your hands on the table for balance but not for support. 2. Slowly bend your knees and lower your hips like you are going to sit in a chair. ? Keep your weight over your heels, not over your toes. ? Keep your lower legs upright so they are parallel with the table legs. ? Do not let your hips go lower than your knees. ? Do not  bend lower than told by your health care provider. ? If your hip pain increases, do not bend as low. 3. Hold the squat position for __________ seconds. 4. Slowly push with your legs to return to standing. Do not use your hands to pull yourself to standing. Repeat __________ times. Complete this exercise __________ times a day. Hip hike 1. Stand sideways on a bottom step. Stand on your left / right leg with your other foot unsupported next to the step. You can hold on to the railing or wall for balance if needed. 2. Keep your knees straight and your torso square. Then lift your left / right hip up toward the ceiling. 3. Hold this position for __________ seconds. 4. Slowly let your left / right hip lower toward the floor, past the starting position. Your foot should get closer to the floor. Do not lean or bend your knees. Repeat __________ times. Complete this exercise __________ times a day. Single leg stand 1. Without shoes, stand near a railing or in a doorway. You may hold on to the railing or door frame as needed for balance. 2. Squeeze your left / right buttock muscles, then lift up your other foot. ? Do not let your left / right hip push out to the side. ? It is helpful to stand in front of a mirror for this exercise so you can watch your hip. 3. Hold this position for __________ seconds. Repeat __________ times. Complete this exercise __________ times a day. This information is not intended to replace advice given to you by your health care provider. Make sure you discuss any questions you have with your health care provider. Document Revised: 01/29/2019 Document Reviewed: 01/29/2019 Elsevier Patient Education  2020 [employer] Neck Exercises Ask your health care provider which exercises are safe for you. Do exercises exactly as told by your health care provider and adjust them as directed. It is normal to feel mild stretching, pulling, tightness, or discomfort as you do these  exercises. Stop right away if you feel sudden pain or your pain gets worse. Do not begin these exercises until told by your health care provider. Neck exercises can be important for many reasons. They can improve strength and maintain flexibility in your neck, which will help your upper back and prevent neck pain. Stretching exercises Rotation neck stretching  1. Sit in a chair or stand up. 2. Place your feet flat on the floor, shoulder width apart. 3. Slowly turn your head (rotate) to the right until a slight stretch is felt. Turn it all the way to the right so you can look over your right shoulder. Do not tilt or tip your head. 4. Hold this position for 10-30 seconds. 5. Slowly turn your head (rotate) to the left until a slight stretch is felt. Turn it all the way to the left so  you can look over your left shoulder. Do not tilt or tip your head. 6. Hold this position for 10-30 seconds. Repeat __________ times. Complete this exercise __________ times a day. Neck retraction 1. Sit in a sturdy chair or stand up. 2. Look straight ahead. Do not bend your neck. 3. Use your fingers to push your chin backward (retraction). Do not bend your neck for this movement. Continue to face straight ahead. If you are doing the exercise properly, you will feel a slight sensation in your throat and a stretch at the back of your neck. 4. Hold the stretch for 1-2 seconds. Repeat __________ times. Complete this exercise __________ times a day. Strengthening exercises Neck press 1. Lie on your back on a firm bed or on the floor with a pillow under your head. 2. Use your neck muscles to push your head down on the pillow and straighten your spine. 3. Hold the position as well as you can. Keep your head facing up (in a neutral position) and your chin tucked. 4. Slowly count to 5 while holding this position. Repeat __________ times. Complete this exercise __________ times a day. Isometrics These are exercises in  which you strengthen the muscles in your neck while keeping your neck still (isometrics). 1. Sit in a supportive chair and place your hand on your forehead. 2. Keep your head and face facing straight ahead. Do not flex or extend your neck while doing isometrics. 3. Push forward with your head and neck while pushing back with your hand. Hold for 10 seconds. 4. Do the sequence again, this time putting your hand against the back of your head. Use your head and neck to push backward against the hand pressure. 5. Finally, do the same exercise on either side of your head, pushing sideways against the pressure of your hand. Repeat __________ times. Complete this exercise __________ times a day. Prone head lifts 1. Lie face-down (prone position), resting on your elbows so that your chest and upper back are raised. 2. Start with your head facing downward, near your chest. Position your chin either on or near your chest. 3. Slowly lift your head upward. Lift until you are looking straight ahead. Then continue lifting your head as far back as you can comfortably stretch. 4. Hold your head up for 5 seconds. Then slowly lower it to your starting position. Repeat __________ times. Complete this exercise __________ times a day. Supine head lifts 1. Lie on your back (supine position), bending your knees to point to the ceiling and keeping your feet flat on the floor. 2. Lift your head slowly off the floor, raising your chin toward your chest. 3. Hold for 5 seconds. Repeat __________ times. Complete this exercise __________ times a day. Scapular retraction 1. Stand with your arms at your sides. Look straight ahead. 2. Slowly pull both shoulders (scapulae) backward and downward (retraction) until you feel a stretch between your shoulder blades in your upper back. 3. Hold for 10-30 seconds. 4. Relax and repeat. Repeat __________ times. Complete this exercise __________ times a day. Contact a health care  provider if:  Your neck pain or discomfort gets much worse when you do an exercise.  Your neck pain or discomfort does not improve within 2 hours after you exercise. If you have any of these problems, stop exercising right away. Do not do the exercises again unless your health care provider says that you can. Get help right away if:  You develop sudden, severe neck  pain. If this happens, stop exercising right away. Do not do the exercises again unless your health care provider says that you can. This information is not intended to replace advice given to you by your health care provider. Make sure you discuss any questions you have with your health care provider. Document Revised: 08/02/2018 Document Reviewed: 08/02/2018 Elsevier Patient Education  2020 [employer]  Shoulder Exercises Ask your health care provider which exercises are safe for you. Do exercises exactly as told by your health care provider and adjust them as directed. It is normal to feel mild stretching, pulling, tightness, or discomfort as you do these exercises. Stop right away if you feel sudden pain or your pain gets worse. Do not begin these exercises until told by your health care provider. Stretching exercises External rotation and abduction This exercise is sometimes called corner stretch. This exercise rotates your arm outward (external rotation) and moves your arm out from your body (abduction). 1. Stand in a doorway with one of your feet slightly in front of the other. This is called a staggered stance. If you cannot reach your forearms to the door frame, stand facing a corner of a room. 2. Choose one of the following positions as told by your health care provider: ? Place your hands and forearms on the door frame above your head. ? Place your hands and forearms on the door frame at the height of your head. ? Place your hands on the door frame at the height of your elbows. 3. Slowly move your weight onto your  front foot until you feel a stretch across your chest and in the front of your shoulders. Keep your head and chest upright and keep your abdominal muscles tight. 4. Hold for __________ seconds. 5. To release the stretch, shift your weight to your back foot. Repeat __________ times. Complete this exercise __________ times a day. Extension, standing 1. Stand and hold a broomstick, a cane, or a similar object behind your back. ? Your hands should be a little wider than shoulder width apart. ? Your palms should face away from your back. 2. Keeping your elbows straight and your shoulder muscles relaxed, move the stick away from your body until you feel a stretch in your shoulders (extension). ? Avoid shrugging your shoulders while you move the stick. Keep your shoulder blades tucked down toward the middle of your back. 3. Hold for __________ seconds. 4. Slowly return to the starting position. Repeat __________ times. Complete this exercise __________ times a day. Range-of-motion exercises Pendulum  1. Stand near a wall or a surface that you can hold onto for balance. 2. Bend at the waist and let your left / right arm hang straight down. Use your other arm to support you. Keep your back straight and do not lock your knees. 3. Relax your left / right arm and shoulder muscles, and move your hips and your trunk so your left / right arm swings freely. Your arm should swing because of the motion of your body, not because you are using your arm or shoulder muscles. 4. Keep moving your hips and trunk so your arm swings in the following directions, as told by your health care provider: ? Side to side. ? Forward and backward. ? In clockwise and counterclockwise circles. 5. Continue each motion for __________ seconds, or for as long as told by your health care provider. 6. Slowly return to the starting position. Repeat __________ times. Complete this exercise __________ times a  day. Shoulder flexion,  standing  1. Stand and hold a broomstick, a cane, or a similar object. Place your hands a little more than shoulder width apart on the object. Your left / right hand should be palm up, and your other hand should be palm down. 2. Keep your elbow straight and your shoulder muscles relaxed. Push the stick up with your healthy arm to raise your left / right arm in front of your body, and then over your head until you feel a stretch in your shoulder (flexion). ? Avoid shrugging your shoulder while you raise your arm. Keep your shoulder blade tucked down toward the middle of your back. 3. Hold for __________ seconds. 4. Slowly return to the starting position. Repeat __________ times. Complete this exercise __________ times a day. Shoulder abduction, standing 1. Stand and hold a broomstick, a cane, or a similar object. Place your hands a little more than shoulder width apart on the object. Your left / right hand should be palm up, and your other hand should be palm down. 2. Keep your elbow straight and your shoulder muscles relaxed. Push the object across your body toward your left / right side. Raise your left / right arm to the side of your body (abduction) until you feel a stretch in your shoulder. ? Do not raise your arm above shoulder height unless your health care provider tells you to do that. ? If directed, raise your arm over your head. ? Avoid shrugging your shoulder while you raise your arm. Keep your shoulder blade tucked down toward the middle of your back. 3. Hold for __________ seconds. 4. Slowly return to the starting position. Repeat __________ times. Complete this exercise __________ times a day. Internal rotation  1. Place your left / right hand behind your back, palm up. 2. Use your other hand to dangle an exercise band, a towel, or a similar object over your shoulder. Grasp the band with your left / right hand so you are holding on to both ends. 3. Gently pull up on the band until  you feel a stretch in the front of your left / right shoulder. The movement of your arm toward the center of your body is called internal rotation. ? Avoid shrugging your shoulder while you raise your arm. Keep your shoulder blade tucked down toward the middle of your back. 4. Hold for __________ seconds. 5. Release the stretch by letting go of the band and lowering your hands. Repeat __________ times. Complete this exercise __________ times a day. Strengthening exercises External rotation  1. Sit in a stable chair without armrests. 2. Secure an exercise band to a stable object at elbow height on your left / right side. 3. Place a soft object, such as a folded towel or a small pillow, between your left / right upper arm and your body to move your elbow about 4 inches (10 cm) away from your side. 4. Hold the end of the exercise band so it is tight and there is no slack. 5. Keeping your elbow pressed against the soft object, slowly move your forearm out, away from your abdomen (external rotation). Keep your body steady so only your forearm moves. 6. Hold for __________ seconds. 7. Slowly return to the starting position. Repeat __________ times. Complete this exercise __________ times a day. Shoulder abduction  1. Sit in a stable chair without armrests, or stand up. 2. Hold a __________ weight in your left / right hand, or hold an exercise  band with both hands. 3. Start with your arms straight down and your left / right palm facing in, toward your body. 4. Slowly lift your left / right hand out to your side (abduction). Do not lift your hand above shoulder height unless your health care provider tells you that this is safe. ? Keep your arms straight. ? Avoid shrugging your shoulder while you do this movement. Keep your shoulder blade tucked down toward the middle of your back. 5. Hold for __________ seconds. 6. Slowly lower your arm, and return to the starting position. Repeat __________  times. Complete this exercise __________ times a day. Shoulder extension 1. Sit in a stable chair without armrests, or stand up. 2. Secure an exercise band to a stable object in front of you so it is at shoulder height. 3. Hold one end of the exercise band in each hand. Your palms should face each other. 4. Straighten your elbows and lift your hands up to shoulder height. 5. Step back, away from the secured end of the exercise band, until the band is tight and there is no slack. 6. Squeeze your shoulder blades together as you pull your hands down to the sides of your thighs (extension). Stop when your hands are straight down by your sides. Do not let your hands go behind your body. 7. Hold for __________ seconds. 8. Slowly return to the starting position. Repeat __________ times. Complete this exercise __________ times a day. Shoulder row 1. Sit in a stable chair without armrests, or stand up. 2. Secure an exercise band to a stable object in front of you so it is at waist height. 3. Hold one end of the exercise band in each hand. Position your palms so that your thumbs are facing the ceiling (neutral position). 4. Bend each of your elbows to a 90-degree angle (right angle) and keep your upper arms at your sides. 5. Step back until the band is tight and there is no slack. 6. Slowly pull your elbows back behind you. 7. Hold for __________ seconds. 8. Slowly return to the starting position. Repeat __________ times. Complete this exercise __________ times a day. Shoulder press-ups  1. Sit in a stable chair that has armrests. Sit upright, with your feet flat on the floor. 2. Put your hands on the armrests so your elbows are bent and your fingers are pointing forward. Your hands should be about even with the sides of your body. 3. Push down on the armrests and use your arms to lift yourself off the chair. Straighten your elbows and lift yourself up as much as you comfortably can. ? Move your  shoulder blades down, and avoid letting your shoulders move up toward your ears. ? Keep your feet on the ground. As you get stronger, your feet should support less of your body weight as you lift yourself up. 4. Hold for __________ seconds. 5. Slowly lower yourself back into the chair. Repeat __________ times. Complete this exercise __________ times a day. Wall push-ups  1. Stand so you are facing a stable wall. Your feet should be about one arm-length away from the wall. 2. Lean forward and place your palms on the wall at shoulder height. 3. Keep your feet flat on the floor as you bend your elbows and lean forward toward the wall. 4. Hold for __________ seconds. 5. Straighten your elbows to push yourself back to the starting position. Repeat __________ times. Complete this exercise __________ times a day. This information is  not intended to replace advice given to you by your health care provider. Make sure you discuss any questions you have with your health care provider. Document Revised: 01/26/2019 Document Reviewed: 11/03/2018 Elsevier Patient Education  [place].

## 2019-11-29 NOTE — Progress Notes (Signed)
CBC stable. Glucose is 107.  Rest of CMP WNL.

## 2019-12-17 ENCOUNTER — Other Ambulatory Visit: Payer: Self-pay | Admitting: Rheumatology

## 2019-12-17 DIAGNOSIS — M0579 Rheumatoid arthritis with rheumatoid factor of multiple sites without organ or systems involvement: Secondary | ICD-10-CM

## 2019-12-17 NOTE — Telephone Encounter (Signed)
Last Visit: 11/28/19 Next Visit: 05/07/20 Labs: 11/28/19 CBC stable. Glucose is 107. Rest of CMP WNL.  Okay to refill per Dr. Corliss Skains

## 2019-12-27 ENCOUNTER — Telehealth: Payer: Self-pay | Admitting: Rheumatology

## 2019-12-27 MED ORDER — SULFASALAZINE 500 MG PO TBEC: 1000.0000 mg | DELAYED_RELEASE_TABLET | Freq: Two times a day (BID) | ORAL | 0 refills | Status: DC

## 2019-12-27 NOTE — Telephone Encounter (Signed)
Patient called requesting prescription refill of Sulfasalazine to be sent to [facility] at [phone].  Patient states she only has enough pills remaining for 2 days.

## 2019-12-27 NOTE — Telephone Encounter (Signed)
Last Visit: 11/28/19 Next Visit: 05/07/20 Labs: 11/28/19 CBC stable. Glucose is 107. Rest of CMP WNL.  Per last office note 11/28/19 dose is: Sulfasalazine 500 mg 2 tablets twice daily.  Okay to refill per Dr. Corliss Skains

## 2020-02-05 ENCOUNTER — Telehealth: Payer: Self-pay | Admitting: Rheumatology

## 2020-02-05 NOTE — Telephone Encounter (Signed)
I LMOM for patient to call, and discuss scheduling a sooner rov to discuss Bone Density results.

## 2020-02-05 NOTE — Telephone Encounter (Signed)
-----   Message from [name], RT sent at 01/31/2020  4:37 PM EDT ----- Regarding: DEXA TREATMENT APPT Please call patient and schedule DEXA treatment appt with [name] when [facility] is here. Please put in the appt note DEXA treatment/DEXA results in Heather Koch's drawer/Amber discussion. Thank you.

## 2020-02-21 NOTE — Progress Notes (Signed)
Office Visit Note  Patient: Heather Koch             Date of Birth: 1951/01/14           MRN: [id number]             PCP: Burton Apley, MD Referring: Burton Apley, MD Visit Date: 03/03/2020 Occupation: @GUAROCC @  Subjective:  Medication monitoring   History of Present Illness: Heather Koch is a 69 y.o. female with history of seropositive rheumatoid arthritis and osteoarthritis. She discontinued sulfasalazine in March 2021 due to the prescription shortage, and she has been on MTX 0.8 ml sq once weekly as monotherapy.  She states she feels much better since discontinuing MTX. She states the oral ulcerations have resolved since discontinuing SSZ. She denies any recent rheumatoid arthritis flares.  She denies any joint pain or joint swelling at this time.   She is apprehensive to start any medications for the management of osteoporosis.  She is taking a vitamin D supplement daily.  Activities of Daily Living:  Patient reports morning stiffness for 0 minutes.   Patient Denies nocturnal pain.  Difficulty dressing/grooming: Denies Difficulty climbing stairs: Denies Difficulty getting out of chair: Denies Difficulty using hands for taps, buttons, cutlery, and/or writing: Denies  Review of Systems  Constitutional: Negative for fatigue.  HENT: Negative for mouth dryness and nose dryness.   Eyes: Negative for pain, itching, visual disturbance and dryness.  Respiratory: Negative for cough, hemoptysis, shortness of breath and difficulty breathing.   Cardiovascular: Negative for chest pain, palpitations, hypertension and swelling in legs/feet.  Gastrointestinal: Negative for blood in stool, constipation and diarrhea.  Endocrine: Negative for increased urination.  Genitourinary: Negative for difficulty urinating and painful urination.  Musculoskeletal: Negative for arthralgias, joint pain, joint swelling, myalgias, muscle weakness, morning stiffness, muscle tenderness and myalgias.  Skin:  Negative for color change, pallor, rash, hair loss, nodules/bumps, redness, skin tightness, ulcers and sensitivity to sunlight.  Allergic/Immunologic: Negative for susceptible to infections.  Neurological: Negative for dizziness, numbness, headaches, memory loss and weakness.  Hematological: Negative for bruising/bleeding tendency and swollen glands.  Psychiatric/Behavioral: Negative for depressed mood, confusion and sleep disturbance. The patient is not nervous/anxious.     PMFS History:  Patient Active Problem List   Diagnosis Date Noted  . Abnormal lung sounds 04/12/2017  . Rheumatoid arthritis involving multiple sites with positive rheumatoid factor (HCC) 04/07/2017  . High risk medication use 04/07/2017  . Primary osteoarthritis of both hands 04/07/2017  . Vitamin D deficiency 04/07/2017  . History of Granuloma annulare 04/07/2017  . Osteopenia of multiple sites 04/07/2017  . ANXIETY 01/31/2008  . CONTACT DERMATITIS&OTHER ECZEMA DUE TO PLANTS 01/31/2008  . ALLERGIC RHINITIS 06/29/2007  . COLONIC POLYPS, HX OF 06/29/2007    Past Medical History:  Diagnosis Date  . ASCUS (atypical squamous cells of undetermined significance) on Pap smear 12/2008   neg HR HPV  . LGSIL (low grade squamous intraepithelial dysplasia) 2005/2006  . Lichen sclerosus 2012  . Rheumatoid arthritis(714.0) 2012    Family History  Problem Relation Age of Onset  . Hypertension Mother   . Diabetes Mother   . Cancer Father        liver  . Heart disease Sister   . Cancer Brother        pancreatic cancer  . Cancer Maternal Aunt        throat  . Heart disease Sister   . Heart disease Sister   . Cancer Maternal Uncle  Colon  . Heart disease Brother        pacemaker  . Healthy Son   . Healthy Son   . Healthy Daughter    Past Surgical History:  Procedure Laterality Date  . TUBAL LIGATION  1997   Social History   Social History Narrative  . Not on file    There is no immunization history  on file for this patient.   Objective: Vital Signs: BP (!) 152/83 (BP Location: Left Arm, Patient Position: Sitting, Cuff Size: Normal)   Pulse 79   Resp 13   Ht 5\' 3"  (1.6 m)   Wt 155 lb (70.3 kg)   LMP 03/31/2003   BMI 27.46 kg/m    Physical Exam Vitals and nursing note reviewed.  Constitutional:      Appearance: She is well-developed.  HENT:     Head: Normocephalic and atraumatic.  Eyes:     Conjunctiva/sclera: Conjunctivae normal.  Pulmonary:     Effort: Pulmonary effort is normal.  Abdominal:     General: Bowel sounds are normal.     Palpations: Abdomen is soft.  Musculoskeletal:     Cervical back: Normal range of motion.  Lymphadenopathy:     Cervical: No cervical adenopathy.  Skin:    General: Skin is warm and dry.     Capillary Refill: Capillary refill takes less than 2 seconds.  Neurological:     Mental Status: She is alert and oriented to person, place, and time.  Psychiatric:        Behavior: Behavior normal.      Musculoskeletal Exam: C-spine, thoracic spine, and lumbar spine good ROM.  No midline spinal tenderness.  Scoliosis noted.  No SI joint tenderness.  Shoulder joints, elbow joints, wrist joints, MPCs, PIPs, and DIPs good ROM with no synovitis.  Ulnar deviation bilaterally. Synovial thickening of 1st, 2nd, and 3rd MCP joints bilaterally.  DIP thickening consistent with osteoarthritis of both hands. Hip joints, knee joints, ankle joints, MTPs, PIPs, and DIPs good ROM with no synovitis.  No warmth or effusion of knee joints.  No tenderness or inflammation of ankle joints.  No tenderness of MTP joints.   CDAI Exam: CDAI Score: - Patient Global: -; Provider Global: - Swollen: -; Tender: - Joint Exam 03/03/2020   No joint exam has been documented for this visit   There is currently no information documented on the homunculus. Go to the Rheumatology activity and complete the homunculus joint exam.  Investigation: No additional findings.  Imaging: No  results found.  Recent Labs: Lab Results  Component Value Date   WBC 5.4 02/25/2020   HGB 11.8 02/25/2020   PLT 197 02/25/2020   NA 139 02/25/2020   K 3.9 02/25/2020   CL 104 02/25/2020   CO2 29 02/25/2020   GLUCOSE 96 02/25/2020   BUN 11 02/25/2020   CREATININE 0.57 02/25/2020   BILITOT 0.5 02/25/2020   ALKPHOS 67 05/31/2017   AST 18 02/25/2020   ALT 11 02/25/2020   PROT 6.7 02/25/2020   ALBUMIN 4.0 05/31/2017   CALCIUM 9.2 02/25/2020   GFRAA 111 02/25/2020    Speciality Comments: No specialty comments available.  Procedures:  No procedures performed Allergies: Fish allergy   Assessment / Plan:     Visit Diagnoses: Rheumatoid arthritis involving multiple sites with positive rheumatoid factor (HCC) - erosive disease with +CCP +RF -ANA: She has no tenderness or synovitis on exam.  She has not had any recent rheumatoid arthritis flares.  She  is currently on Methotrexate 0.8 ml sq injections once weekly and folic acid 2 mg po daily as monotherapy.  She discontinued sulfasalazine in March 2021 due to the prescription shortage.  According to the patient she has felt much better since discontinuing sulfasalazine.  She has not had any oral ulcerations since discontinuing.  She is not having joint pain or inflammation at this time.  She will continue on methotrexate 0.8 mL subcutaneous injections once weekly and folic acid 2 mg by mouth daily.  She does not need any refills at this time.  She was advised to notify us if she develops increased joint pain or joint swelling.  She will follow-up in the office in 5 months.  High risk medication use - Methotrexate 0.8 ml every 7 days, and folic acid 1 mg 2 tablets daily. D/c SSZ in March 2021-Rx shortage and oral ulcers.  CBC and CMP were drawn on 02/25/2020.  She will be due to update lab work in August and every 3 months.  Standing orders are in place.  She has received both COVID-19 vaccinations.  Rheumatoid nodulosis (HCC) - Resolved    Primary osteoarthritis of both hands: She has DIP thickening consistent with osteoarthritis of both hands.  No tenderness or inflammation was noted.  She has complete fist formation bilaterally.  Pes anserine bursitis - Resolved   Trochanteric bursitis, right hip: Resolved.  She has no tenderness to palpation.  Age-related osteoporosis without current pathological fracture - DEXA updated on 08/17/2019 1/3 left distal radius BMD 0.501 with T score -3.2.  -11% change in BMD of distal radius and -6% change in left FN. previous DEXA in 2017 T score -2.5.  She is apprehensive to start on any medications at this time.  Treatment options were discussed at length.  All questions were addressed.  We discussed the risks of untreated osteoporosis including vertebral fractures and kyphosis.  She would like to continue taking calcium and vitamin D as recommended.  Resistive exercises were discussed.  History of vitamin D deficiency: She is taking a vitamin D supplement as recommended.  Other medical conditions are listed as follows:  History of anxiety  Granuloma annulare  History of colon polyps  Orders: No orders of the defined types were placed in this encounter.  No orders of the defined types were placed in this encounter.   Face-to-face time spent with patient was 30 minutes. Greater than 50% of time was spent in counseling and coordination of care.  Follow-Up Instructions: Return in about 5 months (around 08/03/2020) for Rheumatoid arthritis, Osteoporosis.   Pollyann Savoy, MD   Scribed by-  Sherron Ales, PA-C Note - This record has been created using Dragon software.  Chart creation errors have been sought, but may not always  have been located. Such creation errors do not reflect on  the standard of medical care.

## 2020-02-25 ENCOUNTER — Other Ambulatory Visit: Payer: Self-pay

## 2020-02-25 DIAGNOSIS — Z79899 Other long term (current) drug therapy: Secondary | ICD-10-CM

## 2020-02-25 LAB — CBC WITH DIFFERENTIAL/PLATELET
Absolute Monocytes: 421 cells/uL (ref 200–950)
Basophils Absolute: 32 cells/uL (ref 0–200)
Basophils Relative: 0.6 %
Eosinophils Absolute: 81 cells/uL (ref 15–500)
Eosinophils Relative: 1.5 %
HCT: 36.9 % (ref 35.0–45.0)
Hemoglobin: 11.8 g/dL (ref 11.7–15.5)
Lymphs Abs: 1933 cells/uL (ref 850–3900)
MCH: 31.9 pg (ref 27.0–33.0)
MCHC: 32 g/dL (ref 32.0–36.0)
MCV: 99.7 fL (ref 80.0–100.0)
MPV: 11.8 fL (ref 7.5–12.5)
Monocytes Relative: 7.8 %
Neutro Abs: 2932 cells/uL (ref 1500–7800)
Neutrophils Relative %: 54.3 %
Platelets: 197 10*3/uL (ref 140–400)
RBC: 3.7 10*6/uL — ABNORMAL LOW (ref 3.80–5.10)
RDW: 12.5 % (ref 11.0–15.0)
Total Lymphocyte: 35.8 %
WBC: 5.4 10*3/uL (ref 3.8–10.8)

## 2020-02-25 LAB — COMPLETE METABOLIC PANEL WITH GFR
AG Ratio: 1.4 (calc) (ref 1.0–2.5)
ALT: 11 U/L (ref 6–29)
AST: 18 U/L (ref 10–35)
Albumin: 3.9 g/dL (ref 3.6–5.1)
Alkaline phosphatase (APISO): 58 U/L (ref 37–153)
BUN: 11 mg/dL (ref 7–25)
CO2: 29 mmol/L (ref 20–32)
Calcium: 9.2 mg/dL (ref 8.6–10.4)
Chloride: 104 mmol/L (ref 98–110)
Creat: 0.57 mg/dL (ref 0.50–0.99)
GFR, Est African American: 111 mL/min/{1.73_m2} (ref >=60)
GFR, Est Non African American: 96 mL/min/{1.73_m2} (ref >=60)
Globulin: 2.8 g/dL (calc) (ref 1.9–3.7)
Glucose, Bld: 96 mg/dL (ref 65–99)
Potassium: 3.9 mmol/L (ref 3.5–5.3)
Sodium: 139 mmol/L (ref 135–146)
Total Bilirubin: 0.5 mg/dL (ref 0.2–1.2)
Total Protein: 6.7 g/dL (ref 6.1–8.1)

## 2020-02-26 ENCOUNTER — Other Ambulatory Visit: Payer: Self-pay | Admitting: Rheumatology

## 2020-02-26 DIAGNOSIS — M0579 Rheumatoid arthritis with rheumatoid factor of multiple sites without organ or systems involvement: Secondary | ICD-10-CM

## 2020-02-26 NOTE — Telephone Encounter (Signed)
Last Visit: 11/28/2019 Next Visit: 05/07/2020 Labs: 02/25/2020 RBC count is borderline low but improving. Rest of CBC WNL. CMP WNL.   Current Dose per office note on 11/28/2019: Methotrexate 0.8 ml every 7 days  Okay to refill per Dr. Corliss Skains

## 2020-03-03 ENCOUNTER — Encounter: Payer: Self-pay | Admitting: Rheumatology

## 2020-03-03 ENCOUNTER — Ambulatory Visit: Payer: Medicare Other | Admitting: Rheumatology

## 2020-03-03 ENCOUNTER — Other Ambulatory Visit: Payer: Self-pay

## 2020-03-03 VITALS — BP 152/83 | HR 79 | Resp 13 | Ht 63.0 in | Wt 155.0 lb

## 2020-03-03 DIAGNOSIS — M7050 Other bursitis of knee, unspecified knee: Secondary | ICD-10-CM

## 2020-03-03 DIAGNOSIS — Z79899 Other long term (current) drug therapy: Secondary | ICD-10-CM

## 2020-03-03 DIAGNOSIS — M19042 Primary osteoarthritis, left hand: Secondary | ICD-10-CM

## 2020-03-03 DIAGNOSIS — L920 Granuloma annulare: Secondary | ICD-10-CM

## 2020-03-03 DIAGNOSIS — Z86010 Personal history of colonic polyps: Secondary | ICD-10-CM

## 2020-03-03 DIAGNOSIS — Z8659 Personal history of other mental and behavioral disorders: Secondary | ICD-10-CM

## 2020-03-03 DIAGNOSIS — M7061 Trochanteric bursitis, right hip: Secondary | ICD-10-CM

## 2020-03-03 DIAGNOSIS — M19041 Primary osteoarthritis, right hand: Secondary | ICD-10-CM | POA: Diagnosis not present

## 2020-03-03 DIAGNOSIS — M0579 Rheumatoid arthritis with rheumatoid factor of multiple sites without organ or systems involvement: Secondary | ICD-10-CM | POA: Diagnosis not present

## 2020-03-03 DIAGNOSIS — M0630 Rheumatoid nodule, unspecified site: Secondary | ICD-10-CM | POA: Diagnosis not present

## 2020-03-03 DIAGNOSIS — M810 Age-related osteoporosis without current pathological fracture: Secondary | ICD-10-CM

## 2020-03-03 DIAGNOSIS — M8589 Other specified disorders of bone density and structure, multiple sites: Secondary | ICD-10-CM

## 2020-03-03 DIAGNOSIS — Z8639 Personal history of other endocrine, nutritional and metabolic disease: Secondary | ICD-10-CM

## 2020-03-03 NOTE — Patient Instructions (Signed)
Standing Labs We placed an order today for your standing lab work.    Please come back and get your standing labs in August and every 3 months   We have open lab daily Monday through Thursday from 8:30-12:30 PM and 1:30-4:30 PM and Friday from 8:30-12:30 PM and 1:30-4:00 PM at the office of Dr. Shaili Deveshwar.   You may experience shorter wait times on Monday and Friday afternoons. The office is located at [address], [address], [place], [place] [address] No appointment is necessary.   Labs are drawn by [facility].  You may receive a bill from [facility] for your lab work.  If you wish to have your labs drawn at another location, please call the office 24 hours in advance to send orders.  If you have any questions regarding directions or hours of operation,  please call [phone].   Just as a reminder please drink plenty of water prior to coming for your lab work. Thanks!   

## 2020-03-03 NOTE — Progress Notes (Signed)
Pharmacy Note  Subjective:  Patient presents today to [facility] for follow up office visit.   Patient was seen by the pharmacist for counseling on Prolia. She has declined treatment with Fosamax in the past.  Objective: CMP     Component Value Date/Time   NA 139 02/25/2020 1335   K 3.9 02/25/2020 1335   CL 104 02/25/2020 1335   CO2 29 02/25/2020 1335   GLUCOSE 96 02/25/2020 1335   BUN 11 02/25/2020 1335   CREATININE 0.57 02/25/2020 1335   CALCIUM 9.2 02/25/2020 1335   PROT 6.7 02/25/2020 1335   ALBUMIN 4.0 05/31/2017 1436   AST 18 02/25/2020 1335   ALT 11 02/25/2020 1335   ALKPHOS 67 05/31/2017 1436   BILITOT 0.5 02/25/2020 1335   GFRNONAA 96 02/25/2020 1335   GFRAA 111 02/25/2020 1335    Vitamin D Lab Results  Component Value Date   VD25OH 41 05/31/2017     Assessment/Plan:  Counseled patient on purpose, proper use, and adverse effects of Prolia.  Counseled patient that Prolia is a medication that must be injected every 6 months by a healthcare professional.  Advised patient to take calcium 1200 mg daily and vitamin D 800 units daily.  Reviewed the most common adverse effects of Prolia including risk of infection, osteonecrosis of the jaw, rash, and muscle/bone pain.  Patient confirms she does not have any major dental work planned at this time.  Reviewed with patient the signs/symptoms of low calcium and advised patient to alert [place] if she experiences these symptoms.  Provided patient with medication education material and answered all questions.   Patient states her sister had osteoporosis and tried oral tablets and daily injections with no improvement.  She declines therapy at this time.  Since patient declined treatment encouraged patient to take calcium and vitamin D for bone health. Advised that she may already get the recommended daily amount of calcium and vitamin D with multivitamin, vitamin D supplement and dairy intake.  Patient provided with  medication education material.  Patient verbalized understanding.  All questions encouraged and answered.  Instructed patient to call with any further questions or concerns.  Verlin Fester, PharmD, [place], CPP Clinical Specialty Pharmacist [phone]  03/03/2020 11:59 AM

## 2020-03-04 ENCOUNTER — Ambulatory Visit: Payer: Medicare Other | Admitting: Rheumatology

## 2020-04-16 ENCOUNTER — Ambulatory Visit
Admission: RE | Admit: 2020-04-16 | Discharge: 2020-04-16 | Disposition: A | Discharge status: Home / Self Care | Payer: Medicare Other | Source: Ambulatory Visit | Attending: Internal Medicine | Admitting: Internal Medicine

## 2020-04-16 ENCOUNTER — Other Ambulatory Visit: Payer: Self-pay | Admitting: Internal Medicine

## 2020-04-16 DIAGNOSIS — R509 Fever, unspecified: Secondary | ICD-10-CM

## 2020-04-18 ENCOUNTER — Encounter (HOSPITAL_COMMUNITY): Payer: Self-pay

## 2020-04-18 ENCOUNTER — Other Ambulatory Visit: Payer: Self-pay

## 2020-04-18 ENCOUNTER — Emergency Department (HOSPITAL_COMMUNITY): Payer: Medicare Other

## 2020-04-18 ENCOUNTER — Inpatient Hospital Stay (HOSPITAL_COMMUNITY)
Admission: EM | Admit: 2020-04-18 | Discharge: 2020-04-23 | DRG: 193 | Disposition: A | Discharge status: Home / Self Care | Payer: Medicare Other | Attending: Family Medicine | Admitting: Family Medicine

## 2020-04-18 DIAGNOSIS — J189 Pneumonia, unspecified organism: Secondary | ICD-10-CM | POA: Diagnosis not present

## 2020-04-18 DIAGNOSIS — I471 Supraventricular tachycardia: Secondary | ICD-10-CM | POA: Diagnosis not present

## 2020-04-18 DIAGNOSIS — R0902 Hypoxemia: Secondary | ICD-10-CM

## 2020-04-18 DIAGNOSIS — Z79899 Other long term (current) drug therapy: Secondary | ICD-10-CM

## 2020-04-18 DIAGNOSIS — J9601 Acute respiratory failure with hypoxia: Secondary | ICD-10-CM

## 2020-04-18 DIAGNOSIS — Z20822 Contact with and (suspected) exposure to covid-19: Secondary | ICD-10-CM | POA: Diagnosis present

## 2020-04-18 DIAGNOSIS — M0579 Rheumatoid arthritis with rheumatoid factor of multiple sites without organ or systems involvement: Secondary | ICD-10-CM | POA: Diagnosis present

## 2020-04-18 DIAGNOSIS — I4892 Unspecified atrial flutter: Secondary | ICD-10-CM | POA: Diagnosis not present

## 2020-04-18 DIAGNOSIS — I480 Paroxysmal atrial fibrillation: Secondary | ICD-10-CM

## 2020-04-18 LAB — BASIC METABOLIC PANEL
Anion gap: 11 (ref 5–15)
BUN: 14 mg/dL (ref 8–23)
CO2: 26 mmol/L (ref 22–32)
Calcium: 9.5 mg/dL (ref 8.9–10.3)
Chloride: 99 mmol/L (ref 98–111)
Creatinine, Ser: 0.95 mg/dL (ref 0.44–1.00)
GFR calc Af Amer: >60 mL/min (ref >60)
GFR calc non Af Amer: >60 mL/min (ref >60)
Glucose, Bld: 135 mg/dL — ABNORMAL HIGH (ref 70–99)
Potassium: 4 mmol/L (ref 3.5–5.1)
Sodium: 136 mmol/L (ref 135–145)

## 2020-04-18 LAB — CBC
HCT: 37.5 % (ref 36.0–46.0)
Hemoglobin: 12.1 g/dL (ref 12.0–15.0)
MCH: 32.1 pg (ref 26.0–34.0)
MCHC: 32.3 g/dL (ref 30.0–36.0)
MCV: 99.5 fL (ref 80.0–100.0)
Platelets: 334 10*3/uL (ref 150–400)
RBC: 3.77 MIL/uL — ABNORMAL LOW (ref 3.87–5.11)
RDW: 13.7 % (ref 11.5–15.5)
WBC: 11.7 10*3/uL — ABNORMAL HIGH (ref 4.0–10.5)
nRBC: 0 % (ref 0.0–0.2)

## 2020-04-18 LAB — TROPONIN I (HIGH SENSITIVITY): Troponin I (High Sensitivity): 6 ng/L (ref <18)

## 2020-04-18 MED ORDER — SODIUM CHLORIDE 0.9% FLUSH
3.0000 mL | Freq: Once | INTRAVENOUS | Status: AC
  Administered 2020-04-19: 3 mL via INTRAVENOUS

## 2020-04-18 NOTE — ED Triage Notes (Addendum)
Pt arrives to ED w/ c/o SOB x 1 week. Pt states she was diagnosed w/ bronchitis 8 days ago and started on abx. Pt denies chest pain. Pt reports 8/10 back pain that she thinks is from coughing. Resp e/u in triage, pt NAD, however SPO2 84-89%. Pt placed on 2 lpm O2 via [place].

## 2020-04-19 ENCOUNTER — Encounter (HOSPITAL_COMMUNITY): Payer: Self-pay | Admitting: Emergency Medicine

## 2020-04-19 ENCOUNTER — Other Ambulatory Visit: Payer: Self-pay

## 2020-04-19 ENCOUNTER — Inpatient Hospital Stay (HOSPITAL_COMMUNITY): Payer: Medicare Other

## 2020-04-19 DIAGNOSIS — M0579 Rheumatoid arthritis with rheumatoid factor of multiple sites without organ or systems involvement: Secondary | ICD-10-CM | POA: Diagnosis present

## 2020-04-19 DIAGNOSIS — I4892 Unspecified atrial flutter: Secondary | ICD-10-CM | POA: Diagnosis not present

## 2020-04-19 DIAGNOSIS — I471 Supraventricular tachycardia: Secondary | ICD-10-CM | POA: Diagnosis not present

## 2020-04-19 DIAGNOSIS — Z20822 Contact with and (suspected) exposure to covid-19: Secondary | ICD-10-CM | POA: Diagnosis present

## 2020-04-19 DIAGNOSIS — J189 Pneumonia, unspecified organism: Secondary | ICD-10-CM | POA: Diagnosis present

## 2020-04-19 DIAGNOSIS — R7989 Other specified abnormal findings of blood chemistry: Secondary | ICD-10-CM | POA: Diagnosis not present

## 2020-04-19 DIAGNOSIS — J9601 Acute respiratory failure with hypoxia: Secondary | ICD-10-CM | POA: Diagnosis present

## 2020-04-19 DIAGNOSIS — R0902 Hypoxemia: Secondary | ICD-10-CM | POA: Diagnosis present

## 2020-04-19 DIAGNOSIS — I480 Paroxysmal atrial fibrillation: Secondary | ICD-10-CM | POA: Diagnosis not present

## 2020-04-19 DIAGNOSIS — Z79899 Other long term (current) drug therapy: Secondary | ICD-10-CM | POA: Diagnosis not present

## 2020-04-19 LAB — C-REACTIVE PROTEIN: CRP: 16.4 mg/dL — ABNORMAL HIGH (ref <1.0)

## 2020-04-19 LAB — CBC
HCT: 33.3 % — ABNORMAL LOW (ref 36.0–46.0)
Hemoglobin: 10.8 g/dL — ABNORMAL LOW (ref 12.0–15.0)
MCH: 32.1 pg (ref 26.0–34.0)
MCHC: 32.4 g/dL (ref 30.0–36.0)
MCV: 99.1 fL (ref 80.0–100.0)
Platelets: 298 10*3/uL (ref 150–400)
RBC: 3.36 MIL/uL — ABNORMAL LOW (ref 3.87–5.11)
RDW: 13.7 % (ref 11.5–15.5)
WBC: 12.3 10*3/uL — ABNORMAL HIGH (ref 4.0–10.5)
nRBC: 0 % (ref 0.0–0.2)

## 2020-04-19 LAB — COMPREHENSIVE METABOLIC PANEL
ALT: 42 U/L (ref 0–44)
AST: 49 U/L — ABNORMAL HIGH (ref 15–41)
Albumin: 2.5 g/dL — ABNORMAL LOW (ref 3.5–5.0)
Alkaline Phosphatase: 118 U/L (ref 38–126)
Anion gap: 13 (ref 5–15)
BUN: 11 mg/dL (ref 8–23)
CO2: 22 mmol/L (ref 22–32)
Calcium: 9.3 mg/dL (ref 8.9–10.3)
Chloride: 99 mmol/L (ref 98–111)
Creatinine, Ser: 0.83 mg/dL (ref 0.44–1.00)
GFR calc Af Amer: >60 mL/min (ref >60)
GFR calc non Af Amer: >60 mL/min (ref >60)
Glucose, Bld: 143 mg/dL — ABNORMAL HIGH (ref 70–99)
Potassium: 4 mmol/L (ref 3.5–5.1)
Sodium: 134 mmol/L — ABNORMAL LOW (ref 135–145)
Total Bilirubin: 0.5 mg/dL (ref 0.3–1.2)
Total Protein: 6.3 g/dL — ABNORMAL LOW (ref 6.5–8.1)

## 2020-04-19 LAB — LACTATE DEHYDROGENASE: LDH: 232 U/L — ABNORMAL HIGH (ref 98–192)

## 2020-04-19 LAB — TRIGLYCERIDES: Triglycerides: 65 mg/dL (ref <150)

## 2020-04-19 LAB — HIV ANTIBODY (ROUTINE TESTING W REFLEX): HIV Screen 4th Generation wRfx: NONREACTIVE

## 2020-04-19 LAB — RESPIRATORY PANEL BY PCR

## 2020-04-19 LAB — D-DIMER, QUANTITATIVE: D-Dimer, Quant: 1.98 ug/mL-FEU — ABNORMAL HIGH (ref 0.00–0.50)

## 2020-04-19 LAB — PROCALCITONIN: Procalcitonin: 0.11 ng/mL

## 2020-04-19 LAB — FERRITIN: Ferritin: 195 ng/mL (ref 11–307)

## 2020-04-19 LAB — SARS CORONAVIRUS 2 BY RT PCR (HOSPITAL ORDER, PERFORMED IN ~~LOC~~ HOSPITAL LAB)
SARS Coronavirus 2: NEGATIVE
SARS Coronavirus 2: NEGATIVE

## 2020-04-19 LAB — STREP PNEUMONIAE URINARY ANTIGEN: Strep Pneumo Urinary Antigen: NEGATIVE

## 2020-04-19 LAB — LACTIC ACID, PLASMA: Lactic Acid, Venous: 1.1 mmol/L (ref 0.5–1.9)

## 2020-04-19 LAB — FIBRINOGEN: Fibrinogen: >800 mg/dL — ABNORMAL HIGH (ref 210–475)

## 2020-04-19 LAB — TROPONIN I (HIGH SENSITIVITY): Troponin I (High Sensitivity): 8 ng/L (ref <18)

## 2020-04-19 MED ORDER — ONDANSETRON HCL 4 MG/2ML IJ SOLN
4.0000 mg | Freq: Once | INTRAMUSCULAR | Status: AC
  Administered 2020-04-19: 4 mg via INTRAVENOUS
  Filled 2020-04-19: qty 2

## 2020-04-19 MED ORDER — SODIUM CHLORIDE 0.9 % IV SOLN
1.0000 g | INTRAVENOUS | Status: DC
  Administered 2020-04-20: 1 g via INTRAVENOUS
  Filled 2020-04-19: qty 0.02

## 2020-04-19 MED ORDER — SODIUM CHLORIDE 0.9 % IV SOLN
500.0000 mg | Freq: Once | INTRAVENOUS | Status: AC
  Administered 2020-04-19: 500 mg via INTRAVENOUS
  Filled 2020-04-19: qty 500

## 2020-04-19 MED ORDER — ACETAMINOPHEN 325 MG PO TABS
650.0000 mg | ORAL_TABLET | Freq: Four times a day (QID) | ORAL | Status: DC | PRN
  Administered 2020-04-19 – 2020-04-22 (×2): 650 mg via ORAL
  Filled 2020-04-19 (×2): qty 2

## 2020-04-19 MED ORDER — SODIUM CHLORIDE 0.9 % IV SOLN
500.0000 mg | INTRAVENOUS | Status: DC
  Administered 2020-04-20: 500 mg via INTRAVENOUS
  Filled 2020-04-19: qty 500

## 2020-04-19 MED ORDER — SODIUM CHLORIDE 0.9 % IV SOLN: Freq: Once | INTRAVENOUS | Status: AC

## 2020-04-19 MED ORDER — ENOXAPARIN SODIUM 40 MG/0.4ML ~~LOC~~ SOLN
40.0000 mg | SUBCUTANEOUS | Status: DC
  Administered 2020-04-19 – 2020-04-20 (×2): 40 mg via SUBCUTANEOUS
  Filled 2020-04-19 (×3): qty 0.4

## 2020-04-19 MED ORDER — DM-GUAIFENESIN ER 30-600 MG PO TB12
1.0000 | ORAL_TABLET | Freq: Two times a day (BID) | ORAL | Status: DC
  Administered 2020-04-19 – 2020-04-23 (×9): 1 via ORAL
  Filled 2020-04-19 (×10): qty 1

## 2020-04-19 MED ORDER — ACETAMINOPHEN 650 MG RE SUPP: 650.0000 mg | Freq: Four times a day (QID) | RECTAL | Status: DC | PRN

## 2020-04-19 MED ORDER — DEXAMETHASONE SODIUM PHOSPHATE 10 MG/ML IJ SOLN
10.0000 mg | Freq: Once | INTRAMUSCULAR | Status: AC
  Administered 2020-04-19: 10 mg via INTRAVENOUS
  Filled 2020-04-19: qty 1

## 2020-04-19 MED ORDER — ONDANSETRON HCL 4 MG PO TABS: 4.0000 mg | ORAL_TABLET | Freq: Four times a day (QID) | ORAL | Status: DC | PRN

## 2020-04-19 MED ORDER — SIMVASTATIN 20 MG PO TABS
20.0000 mg | ORAL_TABLET | Freq: Every day | ORAL | Status: DC
  Administered 2020-04-19 – 2020-04-22 (×4): 20 mg via ORAL
  Filled 2020-04-19 (×4): qty 1

## 2020-04-19 MED ORDER — IOHEXOL 350 MG/ML SOLN
75.0000 mL | Freq: Once | INTRAVENOUS | Status: AC | PRN
  Administered 2020-04-19: 75 mL via INTRAVENOUS

## 2020-04-19 MED ORDER — ALBUTEROL SULFATE (2.5 MG/3ML) 0.083% IN NEBU: 2.5000 mg | INHALATION_SOLUTION | RESPIRATORY_TRACT | Status: DC | PRN

## 2020-04-19 MED ORDER — ONDANSETRON HCL 4 MG/2ML IJ SOLN
4.0000 mg | Freq: Four times a day (QID) | INTRAMUSCULAR | Status: DC | PRN
  Filled 2020-04-19: qty 2

## 2020-04-19 MED ORDER — SODIUM CHLORIDE 0.9 % IV SOLN
1.0000 g | Freq: Once | INTRAVENOUS | Status: AC
  Administered 2020-04-19: 1 g via INTRAVENOUS
  Filled 2020-04-19: qty 10

## 2020-04-19 NOTE — ED Provider Notes (Signed)
[facility] EMERGENCY DEPARTMENT Provider Note   CSN: [id number] Arrival date & time: 04/18/20  2202     History Chief Complaint  Patient presents with  . Shortness of Breath    Heather Koch is a 69 y.o. female.  The history is provided by the patient.  Shortness of Breath Severity:  Severe Onset quality:  Gradual Duration:  10 days Timing:  Constant Progression:  Worsening Chronicity:  New Context: URI   Relieved by:  Nothing Worsened by:  Nothing Ineffective treatments:  None tried Associated symptoms: cough and fever   Associated symptoms: no abdominal pain, no chest pain, no diaphoresis and no rash   Associated symptoms comment:  Myalgias  Cough:    Cough characteristics:  Non-productive   Severity:  Moderate   Onset quality:  Gradual   Duration:  10 days   Timing:  Intermittent   Progression:  Worsening   Chronicity:  New Risk factors: no obesity   Symptoms for 10 days.  Seen by PMD x 2.  First started a week ago Thursday on Keflex and symptoms progressed and then changed to augmentin on Wednesday.  Myalgias and fevers.  No loss of taste and smell.       Past Medical History:  Diagnosis Date  . ASCUS (atypical squamous cells of undetermined significance) on Pap smear 12/2008   neg HR HPV  . LGSIL (low grade squamous intraepithelial dysplasia) 2005/2006  . Lichen sclerosus 2012  . Rheumatoid arthritis(714.0) 2012    Patient Active Problem List   Diagnosis Date Noted  . Abnormal lung sounds 04/12/2017  . Rheumatoid arthritis involving multiple sites with positive rheumatoid factor (HCC) 04/07/2017  . High risk medication use 04/07/2017  . Primary osteoarthritis of both hands 04/07/2017  . Vitamin D deficiency 04/07/2017  . History of Granuloma annulare 04/07/2017  . Osteopenia of multiple sites 04/07/2017  . ANXIETY 01/31/2008  . CONTACT DERMATITIS&OTHER ECZEMA DUE TO PLANTS 01/31/2008  . ALLERGIC RHINITIS 06/29/2007  . COLONIC  POLYPS, HX OF 06/29/2007    Past Surgical History:  Procedure Laterality Date  . TUBAL LIGATION  1997     OB History    Gravida  3   Para  3   Term      Preterm      AB      Living  3     SAB      TAB      Ectopic      Multiple      Live Births              Family History  Problem Relation Age of Onset  . Hypertension Mother   . Diabetes Mother   . Cancer Father        liver  . Heart disease Sister   . Cancer Brother        pancreatic cancer  . Cancer Maternal Aunt        throat  . Heart disease Sister   . Heart disease Sister   . Cancer Maternal Uncle        Colon  . Heart disease Brother        pacemaker  . Healthy Son   . Healthy Son   . Healthy Daughter     Social History   Tobacco Use  . Smoking status: Never Smoker  . Smokeless tobacco: Never Used  Vaping Use  . Vaping Use: Never used  Substance Use Topics  .  Alcohol use: No  . Drug use: No    Home Medications Prior to Admission medications   Medication Sig Start Date End Date Taking? Authorizing Provider  ALPRAZolam Prudy Feeler) 1 MG tablet Take 1 mg by mouth at bedtime as needed.      [provider]  clobetasol cream (TEMOVATE) 0.05 % APPLY TOPICALLY AT BEDTIME AS NEEDED FOR ITCHING 02/21/19   Harrington Challenger, NP  diclofenac sodium (VOLTAREN) 1 % GEL Apply 3 gm to 3 large joints up to 3 times a day.Dispense 3 tubes with 3 refills. 01/19/18   Pollyann Savoy, MD  folic acid (FOLVITE) 1 MG tablet Take 2 tablets (2 mg total) by mouth daily. 10/04/19   Pollyann Savoy, MD  Methotrexate Sodium (METHOTREXATE, PF,) 250 MG/10ML injection INJECT 0.8ML INTO THE SKIN ONCE WEEKLY 02/26/20   Pollyann Savoy, MD  Multiple Vitamin (MULTIVITAMIN PO) Take by mouth daily.    [provider]  simvastatin (ZOCOR) 20 MG tablet Take 1 tablet by mouth daily. 10/21/17   [provider]  TUBERCULIN SYR 1CC/27GX1/2" (B-D TB SYRINGE 1CC/27GX1/2") 27G X 1/2" 1 ML MISC use to inject  methotrexate weekly 10/04/19   Pollyann Savoy, MD  VITAMIN D PO Take by mouth daily.    [provider]    Allergies    Fish allergy  Review of Systems   Review of Systems  Constitutional: Positive for fever. Negative for diaphoresis.  HENT: Negative for congestion.   Eyes: Negative for visual disturbance.  Respiratory: Positive for cough and shortness of breath.   Cardiovascular: Negative for chest pain.  Gastrointestinal: Negative for abdominal pain.  Genitourinary: Negative for difficulty urinating.  Musculoskeletal: Positive for myalgias.  Skin: Negative for rash.  Neurological: Negative for dizziness.  Psychiatric/Behavioral: Negative for agitation.  All other systems reviewed and are negative.   Physical Exam Updated Vital Signs BP 126/76 (BP Location: Right Arm)   Pulse (!) 114   Temp 98.7 F (37.1 C) (Oral)   Resp (!) 22   Ht 5\' 4"  (1.626 m)   Wt 68 kg   LMP 03/31/2003   SpO2 98%   BMI 25.75 kg/m   Physical Exam Vitals and nursing note reviewed.  Constitutional:      General: She is not in acute distress.    Appearance: Normal appearance.  HENT:     Head: Normocephalic and atraumatic.     Nose: Nose normal.  Eyes:     Conjunctiva/sclera: Conjunctivae normal.     Pupils: Pupils are equal, round, and reactive to light.  Cardiovascular:     Rate and Rhythm: Normal rate and regular rhythm.     Pulses: Normal pulses.     Heart sounds: Normal heart sounds.  Pulmonary:     Effort: Pulmonary effort is normal.     Breath sounds: Normal breath sounds.  Abdominal:     General: Abdomen is flat. Bowel sounds are normal.     Tenderness: There is no abdominal tenderness. There is no guarding or rebound.  Musculoskeletal:        General: Normal range of motion.     Cervical back: Normal range of motion and neck supple.  Skin:    General: Skin is warm and dry.     Capillary Refill: Capillary refill takes less than 2 seconds.  Neurological:      General: No focal deficit present.     Mental Status: She is alert and oriented to person, place, and time.  Deep Tendon Reflexes: Reflexes normal.  Psychiatric:        Mood and Affect: Mood normal.        Behavior: Behavior normal.     ED Results / Procedures / Treatments   Labs (all labs ordered are listed, but only abnormal results are displayed) Results for orders placed or performed during the hospital encounter of 04/18/20  SARS Coronavirus 2 by RT PCR (hospital order, performed in [facility] hospital lab) Nasopharyngeal Nasopharyngeal Swab   Specimen: Nasopharyngeal Swab  Result Value Ref Range   SARS Coronavirus 2 NEGATIVE NEGATIVE  Basic metabolic panel  Result Value Ref Range   Sodium 136 135 - 145 mmol/L   Potassium 4.0 3.5 - 5.1 mmol/L   Chloride 99 98 - 111 mmol/L   CO2 26 22 - 32 mmol/L   Glucose, Bld 135 (H) 70 - 99 mg/dL   BUN 14 8 - 23 mg/dL   Creatinine, Ser 0.86 0.44 - 1.00 mg/dL   Calcium 9.5 8.9 - 76.1 mg/dL   GFR calc non Af Amer >60 >60 mL/min   GFR calc Af Amer >60 >60 mL/min   Anion gap 11 5 - 15  CBC  Result Value Ref Range   WBC 11.7 (H) 4.0 - 10.5 K/uL   RBC 3.77 (L) 3.87 - 5.11 MIL/uL   Hemoglobin 12.1 12.0 - 15.0 g/dL   HCT 95.0 36 - 46 %   MCV 99.5 80.0 - 100.0 fL   MCH 32.1 26.0 - 34.0 pg   MCHC 32.3 30.0 - 36.0 g/dL   RDW 93.2 67.1 - 24.5 %   Platelets 334 150 - 400 K/uL   nRBC 0.0 0.0 - 0.2 %  Troponin I (High Sensitivity)  Result Value Ref Range   Troponin I (High Sensitivity) 6 <18 ng/L  Troponin I (High Sensitivity)  Result Value Ref Range   Troponin I (High Sensitivity) 8 <18 ng/L   DG Chest 2 View  Result Date: 04/18/2020 CLINICAL DATA:  Shortness of breath EXAM: CHEST - 2 VIEW COMPARISON:  04/16/2020 FINDINGS: No consolidation or pleural effusion. Mild diffuse increased interstitial and ground-glass opacity. Stable cardiomediastinal silhouette. No pneumothorax. IMPRESSION: Mild diffuse increased interstitial and  ground-glass opacity compared to prior suggesting interstitial inflammatory process/possible atypical infection versus less likely edema Electronically Signed   By: Jasmine Pang M.D.   On: 04/18/2020 23:00   DG Chest 2 View  Result Date: 04/17/2020 CLINICAL DATA:  Fever, cough, pneumonia EXAM: CHEST - 2 VIEW COMPARISON:  04/08/2017 FINDINGS: The heart size and mediastinal contours are within normal limits. Mild, diffuse interstitial pulmonary opacity. Disc degenerative disease of the thoracic spine. IMPRESSION: Mild, diffuse interstitial pulmonary opacity, consistent with edema or infection. No focal airspace opacity. Electronically Signed   By: Lauralyn Primes M.D.   On: 04/17/2020 10:43    EKG EKG Interpretation  Date/Time:  Friday April 18 2020 22:14:27 EDT Ventricular Rate:  101 PR Interval:  130 QRS Duration: 78 QT Interval:  328 QTC Calculation: 425 R Axis:   59 Text Interpretation: Sinus tachycardia Low voltage QRS Confirmed by Nicanor Alcon, Reigna Ruperto ([id number]) on 04/19/2020 3:56:04 AM   Radiology DG Chest 2 View  Result Date: 04/18/2020 CLINICAL DATA:  Shortness of breath EXAM: CHEST - 2 VIEW COMPARISON:  04/16/2020 FINDINGS: No consolidation or pleural effusion. Mild diffuse increased interstitial and ground-glass opacity. Stable cardiomediastinal silhouette. No pneumothorax. IMPRESSION: Mild diffuse increased interstitial and ground-glass opacity compared to prior suggesting interstitial inflammatory process/possible atypical infection versus less  likely edema Electronically Signed   By: Jasmine Pang M.D.   On: 04/18/2020 23:00    Procedures Procedures (including critical care time)  Medications Ordered in ED Medications  azithromycin (ZITHROMAX) 500 mg in sodium chloride 0.9 % 250 mL IVPB (has no administration in time range)  dexamethasone (DECADRON) injection 10 mg (has no administration in time range)  ondansetron (ZOFRAN) injection 4 mg (has no administration in time range)  sodium  chloride flush (NS) 0.9 % injection 3 mL (3 mLs Intravenous Given 04/19/20 0502)  cefTRIAXone (ROCEPHIN) 1 g in sodium chloride 0.9 % 100 mL IVPB (0 g Intravenous Stopped 04/19/20 0534)    ED Course  I have reviewed the triage vital signs and the nursing notes.  Pertinent labs & imaging results that were available during my care of the patient were reviewed by me and considered in my medical decision making (see chart for details).    I do suspect this is covid variant given the symptoms.  I have treated for bacterial infection but suspect this is viral in nature and have sent covid labs   MERSADES BARBARO was evaluated in Emergency Department on 04/19/2020 for the symptoms described in the history of present illness. She was evaluated in the context of the global COVID-19 pandemic, which necessitated consideration that the patient might be at risk for infection with the SARS-CoV-2 virus that causes COVID-19. Institutional protocols and algorithms that pertain to the evaluation of patients at risk for COVID-19 are in a state of rapid change based on information released by regulatory bodies including the CDC and federal and state organizations. These policies and algorithms were followed during the patient's care in the ED.  Final Clinical Impression(s) / ED Diagnoses Admit to medicine    Alan Riles, MD 04/19/20 5732

## 2020-04-19 NOTE — Evaluation (Addendum)
Clinical/Bedside Swallow Evaluation Patient Details  Name: Heather Koch MRN: [id number] Date of Birth: [birth year]  Today's Date: 04/19/2020 Time: SLP Start Time (ACUTE ONLY): 1455 SLP Stop Time (ACUTE ONLY): 1519 SLP Time Calculation (min) (ACUTE ONLY): 24 min  Past Medical History:  Past Medical History:  Diagnosis Date   ASCUS (atypical squamous cells of undetermined significance) on Pap smear 12/2008   neg HR HPV   LGSIL (low grade squamous intraepithelial dysplasia) 2005/2006   Lichen sclerosus 2012   Rheumatoid arthritis(714.0) 2012   Past Surgical History:  Past Surgical History:  Procedure Laterality Date   TUBAL LIGATION  1997   HPI:  69 y.o. female with medical history significant for rheumatoid arthritis who presented to the ED due to about 2 weeks onset of worsening generalized malaise, body aches, fever (up to 101-103F), increasing and worsening shortness of breath, nonproductive cough.  She went to her PCP and she was initially started on Keflex, but due to worsening of symptoms, this was changed to Augmentin after day 6 of Keflex.  Patient states that there was worsening of symptoms despite being on Augmentin. On 04/18/20, she went up the stairs at home and by the time she got to the top of the stairs, patient states that she almost collapsed due to extreme shortness of breath and she realized that she needed to go to the ED for further evaluation and management. CT chest on 04/19/20 indicated: No pulmonary embolus. 2. Patchy consolidation is identified throughout all lobes of bilateral lungs. This is consistent with multifocal pneumonia. 3. Minimal bilateral pleural effusions. 4. 1.3 cm nodule is identified in the central posterior left breast. Recommend further evaluation with diagnostic mammogram and ultrasound.  Assessment / Plan / Recommendation Clinical Impression  Pt seen for skilled assessment with a normal oropharyngeal swallow noted; oral preparation/propulsion   adequate, timely swallow and no overt s/s of aspiration noted throughout BSE.  Pt denies dysphagia in past or present despite respiratory status.  Continue current diet of regular consistency/thin liquids with general swallowing precautions in place.  ST not recommended at this time unless re-consult needed.  Thank you for this consult. SLP Visit Diagnosis: Dysphagia, unspecified (R13.10)    Aspiration Risk  No limitations    Diet Recommendation     Medication Administration: Whole meds with liquid    Other  Recommendations Oral Care Recommendations: Oral care BID   Follow up Recommendations None      Frequency and Duration   Evaluation only         Prognosis Prognosis for Safe Diet Advancement: Good      Swallow Study   General Date of Onset: 04/18/20 HPI: 69 y.o. female with medical history significant for rheumatoid arthritis who presents to the ED due to about 2 weeks onset of worsening generalized malaise, body aches, fever (up to 101-103F), increasing and worsening shortness of breath, nonproductive cough.  She went to her PCP and she was initially started on Keflex, but due to worsening of symptoms, this was changed to Augmentin after day 6 of Keflex.  Patient states that there was worsening of symptoms despite being on Augmentin.  Yesterday, she went up the stairs at home and by the time she got to the top of the stairs, patient states that she almost collapsed due to extreme shortness of breath and she realized that she needed to go to the ED for further evaluation and management. Type of Study: Bedside Swallow Evaluation Previous Swallow Assessment:  (n/a) Diet  Prior to this Study: Regular;Thin liquids Temperature Spikes Noted: Yes Respiratory Status: Nasal cannula History of Recent Intubation: No Behavior/Cognition: Alert;Cooperative;Pleasant mood Oral Cavity Assessment: Within Functional Limits Oral Care Completed by SLP: No Oral Cavity - Dentition: Adequate natural  dentition Vision: Functional for self-feeding Self-Feeding Abilities: Able to feed self Patient Positioning: Upright in bed Baseline Vocal Quality: Normal Volitional Cough: Strong Volitional Swallow: Able to elicit    Oral/Motor/Sensory Function Overall Oral Motor/Sensory Function: Within functional limits   Ice Chips Ice chips: Not tested   Thin Liquid Thin Liquid: Within functional limits Presentation: Straw    Nectar Thick Nectar Thick Liquid: Not tested   Honey Thick Honey Thick Liquid: Not tested   Puree Puree: Not tested   Solid     Solid: Within functional limits Presentation: Self Fed      Tressie Stalker, M.S., CCC-SLP 04/19/2020,4:11 PM

## 2020-04-19 NOTE — Progress Notes (Signed)
PROGRESS NOTE    Heather Koch  NID:[id number] DOB: 1951-02-17 DOA: 04/18/2020 PCP: Burton Apley, MD   Chief Complaint  Patient presents with   Shortness of Breath    Brief Narrative:  Heather Koch is Heather Koch 69 y.o. female with medical history significant for rheumatoid arthritis who presents to the ED due to about 2 weeks onset of worsening generalized malaise, body aches, fever (up to 101-103F), increasing and worsening shortness of breath, nonproductive cough.  She went to her PCP and she was initially started on Keflex, but due to worsening of symptoms, this was changed to Augmentin after day 6 of Keflex.  Patient states that there was worsening of symptoms despite being on Augmentin.  Yesterday, she went up the stairs at home and by the time she got to the top of the stairs, patient states that she almost collapsed due to extreme shortness of breath and she realized that she needed to go to the ED for further evaluation and management.  Patient states that she never had COVID-19 pneumonia, and she states that she was already vaccinated against Covid since January 2021.  Patient denies loss of smell or loss of taste for food.  She denies chest pain, abdominal pain, blurry vision, diarrhea or constipation.  ED Course:  In the emergency department, temperature was 100.57F, she was tachypneic and tachycardic, patient was noted to be hypoxic with O2 sat of 86% on room air, but this improved on supplemental oxygen via [place] at 3 LPM (patient does not use supplemental oxygen at baseline).  Up in the ED showed mild leukocytosis and hyperglycemia, SARS coronavirus 2 was negative. Chest x-ray showed mild diffuse increased interstitial and groundglass opacity compared to prior suggesting interstitial inflammatory process/possible atypical infection.  She was empirically started on IV ceftriaxone and azithromycin.  IV Decadron 10 mg due to suspicion for atypical presentation of COVID-19 pneumonia was given.   Referral for MG x1 was given.  Hospitalist was asked to admit.  For further evaluation management.  Assessment & Plan:   Principal Problem:   Acute respiratory failure with hypoxia (HCC) Active Problems:   Rheumatoid arthritis involving multiple sites with positive rheumatoid factor (HCC)   Atypical pneumonia  Acute respiratory failure with Hypoxia   Community Acquired Pneumonia   Multifocal Pneumonia  2 weeks sx, progressive fever, SOB, cough - she's been on abx for about 10 days prior to presentation (keflex -> augmentin)  CXR with mild diffuse interstitial and ggo suggestion interstitial inflammatory process - atypical infection vs less likely edema CT PE protocol with patchy consolidation throughout all lobes of bilateral lungs c/w multifocal pneumonia, minimal bilateral pleural effusions Continue ceftriaxone, azithromycin  Follow sputum cx, urine strep, urine legionella, MRSA pcr. Follow RVP.  COVID negative x2 (she's been vaccinated). Albuterol prn IS, flutter, OOB, respiratory c/s   Rheumatoid arthritis Stable, continue Tylenol 650 MG p.o. every 6 hours as needed for fever  1.3 cm Nodule in Central Posterior L Breast: she needs further eval with mammogram and [place] as outpatient   Elevated D dimer; negative CT PE, follow LE [place], no LE swelling - likely 2/2 pneumonia  DVT prophylaxis: lovenox Code Status: full  Family Communication: husband at bedsdie Disposition:   Status is: Inpatient  Remains inpatient appropriate because:Inpatient level of care appropriate due to severity of illness   Dispo: The patient is from: Home              Anticipated d/c is to: Home  Anticipated d/c date is: > 3 days              Patient currently is not medically stable to d/c.   Consultants:   none  Procedures:   none  Antimicrobials:  Anti-infectives (From admission, onward)   Start     Dose/Rate Route Frequency Ordered Stop   04/20/20 0600  azithromycin (ZITHROMAX)  500 mg in sodium chloride 0.9 % 250 mL IVPB     Discontinue     500 mg 250 mL/hr over 60 Minutes Intravenous Every 24 hours 04/19/20 0627     04/20/20 0500  cefTRIAXone (ROCEPHIN) 1 g in sodium chloride 0.9 % 100 mL IVPB     Discontinue     1 g 200 mL/hr over 30 Minutes Intravenous Every 24 hours 04/19/20 0627     04/19/20 0445  cefTRIAXone (ROCEPHIN) 1 g in sodium chloride 0.9 % 100 mL IVPB        1 g 200 mL/hr over 30 Minutes Intravenous  Once 04/19/20 0432 04/19/20 0534   04/19/20 0445  azithromycin (ZITHROMAX) 500 mg in sodium chloride 0.9 % 250 mL IVPB        500 mg 250 mL/hr over 60 Minutes Intravenous  Once 04/19/20 0432 04/19/20 0756     Subjective: Feels Heather Koch little better  Objective: Vitals:   04/19/20 0430 04/19/20 0500 04/19/20 0745 04/19/20 0933  BP: 140/65 (!) 123/99 (!) 106/53 129/60  Pulse: 88 88 88 81  Resp: (!) 31 (!) 26 (!) 31 18  Temp:    98.2 F (36.8 C)  TempSrc:      SpO2: 93% 93% 93% 94%  Weight:      Height:       No intake or output data in the 24 hours ending 04/19/20 1728 Filed Weights   04/19/20 0346  Weight: 68 kg    Examination:  General exam: Appears calm and comfortable  Respiratory system: scattered wheezes, coarse breath sounds Cardiovascular system: S1 & S2 heard, RRR.  Gastrointestinal system: Abdomen is nondistended, soft and nontender.  Central nervous system: Alert and oriented. No focal neurological deficits. Extremities: Symmetric 5 x 5 power. Skin: No rashes, lesions or ulcers Psychiatry: Judgement and insight appear normal. Mood & affect appropriate.     Data Reviewed: I have personally reviewed following labs and imaging studies  CBC: Recent Labs  Lab 04/18/20 2231 04/19/20 0653  WBC 11.7* 12.3*  HGB 12.1 10.8*  HCT 37.5 33.3*  MCV 99.5 99.1  PLT 334 298    Basic Metabolic Panel: Recent Labs  Lab 04/18/20 2231 04/19/20 0653  NA 136 134*  K 4.0 4.0  CL 99 99  CO2 26 22  GLUCOSE 135* 143*  BUN 14 11   CREATININE 0.95 0.83  CALCIUM 9.5 9.3    GFR: Estimated Creatinine Clearance: 62.3 mL/min (by C-G formula based on SCr of 0.83 mg/dL).  Liver Function Tests: Recent Labs  Lab 04/19/20 0653  AST 49*  ALT 42  ALKPHOS 118  BILITOT 0.5  PROT 6.3*  ALBUMIN 2.5*    CBG: No results for input(s): GLUCAP in the last 168 hours.   Recent Results (from the past 240 hour(s))  SARS Coronavirus 2 by RT PCR (hospital order, performed in [facility] hospital lab) Nasopharyngeal Nasopharyngeal Swab     Status: None   Collection Time: 04/19/20  3:18 AM   Specimen: Nasopharyngeal Swab  Result Value Ref Range Status   SARS Coronavirus 2 NEGATIVE NEGATIVE Final  Comment: (NOTE) SARS-CoV-2 target nucleic acids are NOT DETECTED.  The SARS-CoV-2 RNA is generally detectable in upper and lower respiratory specimens during the acute phase of infection. The lowest concentration of SARS-CoV-2 viral copies this assay can detect is 250 copies / mL. Delshon Blanchfield negative result does not preclude SARS-CoV-2 infection and should not be used as the sole basis for treatment or other patient management decisions.  Latondra Gebhart negative result may occur with improper specimen collection / handling, submission of specimen other than nasopharyngeal swab, presence of viral mutation(s) within the areas targeted by this assay, and inadequate number of viral copies (<250 copies / mL). Thorvald Orsino negative result must be combined with clinical observations, patient history, and epidemiological information.  Fact Sheet for Patients:   BoilerBrush.com.cy  Fact Sheet for Healthcare Providers: https://pope.com/  This test is not yet approved or  cleared by the [place] FDA and has been authorized for detection and/or diagnosis of SARS-CoV-2 by FDA under an Emergency Use Authorization (EUA).  This EUA will remain in effect (meaning this test can be used) for the duration of the COVID-19  declaration under Section 564(b)(1) of the Act, 21 U.S.C. section 360bbb-3(b)(1), unless the authorization is terminated or revoked sooner.  Performed at [facility], [address] [place], [place] [address]   Respiratory Panel by PCR     Status: None   Collection Time: 04/19/20 10:35 AM   Specimen: Nasopharyngeal Swab; Respiratory  Result Value Ref Range Status   Adenovirus NOT DETECTED NOT DETECTED Final   Coronavirus 229E NOT DETECTED NOT DETECTED Final    Comment: (NOTE) The Coronavirus on the Respiratory Panel, DOES NOT test for the novel  Coronavirus (2019 nCoV)    Coronavirus HKU1 NOT DETECTED NOT DETECTED Final   Coronavirus NL63 NOT DETECTED NOT DETECTED Final   Coronavirus OC43 NOT DETECTED NOT DETECTED Final   Metapneumovirus NOT DETECTED NOT DETECTED Final   Rhinovirus / Enterovirus NOT DETECTED NOT DETECTED Final   Influenza Derren Suydam NOT DETECTED NOT DETECTED Final   Influenza B NOT DETECTED NOT DETECTED Final   Parainfluenza Virus 1 NOT DETECTED NOT DETECTED Final   Parainfluenza Virus 2 NOT DETECTED NOT DETECTED Final   Parainfluenza Virus 3 NOT DETECTED NOT DETECTED Final   Parainfluenza Virus 4 NOT DETECTED NOT DETECTED Final   Respiratory Syncytial Virus NOT DETECTED NOT DETECTED Final   Bordetella pertussis NOT DETECTED NOT DETECTED Final   Chlamydophila pneumoniae NOT DETECTED NOT DETECTED Final   Mycoplasma pneumoniae NOT DETECTED NOT DETECTED Final    Comment: Performed at [facility], [address], [place], [place] [address]  SARS Coronavirus 2 by RT PCR (hospital order, performed in [facility] hospital lab) Nasopharyngeal Nasopharyngeal Swab     Status: None   Collection Time: 04/19/20 10:35 AM   Specimen: Nasopharyngeal Swab  Result Value Ref Range Status   SARS Coronavirus 2 NEGATIVE NEGATIVE Final    Comment: (NOTE) SARS-CoV-2 target nucleic acids are NOT DETECTED.  The SARS-CoV-2 RNA is generally detectable in upper and  lower respiratory specimens during the acute phase of infection. The lowest concentration of SARS-CoV-2 viral copies this assay can detect is 250 copies / mL. Samanvi Cuccia negative result does not preclude SARS-CoV-2 infection and should not be used as the sole basis for treatment or other patient management decisions.  Orvin Netter negative result may occur with improper specimen collection / handling, submission of specimen other than nasopharyngeal swab, presence of viral mutation(s) within the areas targeted by  this assay, and inadequate number of viral copies (<250 copies / mL). Kerwin Augustus negative result must be combined with clinical observations, patient history, and epidemiological information.  Fact Sheet for Patients:   BoilerBrush.com.cy  Fact Sheet for Healthcare Providers: https://pope.com/  This test is not yet approved or  cleared by the [place] FDA and has been authorized for detection and/or diagnosis of SARS-CoV-2 by FDA under an Emergency Use Authorization (EUA).  This EUA will remain in effect (meaning this test can be used) for the duration of the COVID-19 declaration under Section 564(b)(1) of the Act, 21 U.S.C. section 360bbb-3(b)(1), unless the authorization is terminated or revoked sooner.  Performed at [facility], [address], [place], [place] [address]          Radiology Studies: DG Chest 2 View  Result Date: 04/18/2020 CLINICAL DATA:  Shortness of breath EXAM: CHEST - 2 VIEW COMPARISON:  04/16/2020 FINDINGS: No consolidation or pleural effusion. Mild diffuse increased interstitial and ground-glass opacity. Stable cardiomediastinal silhouette. No pneumothorax. IMPRESSION: Mild diffuse increased interstitial and ground-glass opacity compared to prior suggesting interstitial inflammatory process/possible atypical infection versus less likely edema Electronically Signed   By: Jasmine Pang M.D.   On: 04/18/2020 23:00    CT ANGIO CHEST PE W OR WO CONTRAST  Result Date: 04/19/2020 CLINICAL DATA:  Shortness of breath for 1 week EXAM: CT ANGIOGRAPHY CHEST WITH CONTRAST TECHNIQUE: Multidetector CT imaging of the chest was performed using the standard protocol during bolus administration of intravenous contrast. Multiplanar CT image reconstructions and MIPs were obtained to evaluate the vascular anatomy. CONTRAST:  75mL OMNIPAQUE IOHEXOL 350 MG/ML SOLN COMPARISON:  Chest x-ray April 18, 2020 FINDINGS: Cardiovascular: Satisfactory opacification of the pulmonary arteries to the segmental level. No evidence of pulmonary embolism. Normal heart size. No pericardial effusion. Mediastinum/Nodes: No enlarged mediastinal, hilar, or axillary lymph nodes. Thyroid gland, trachea, and esophagus demonstrate no significant findings. Lungs/Pleura: Patchy consolidation is identified throughout all lobes of bilateral lungs. Minimal bilateral pleural effusions are noted. Upper Abdomen: No acute abnormality. Musculoskeletal: No acute abnormality is identified. Degenerative joint changes of the spine are noted. 1.3 cm nodule is identified in the central posterior left breast series 5, image 48. Review of the MIP images confirms the above findings. IMPRESSION: 1. No pulmonary embolus. 2. Patchy consolidation is identified throughout all lobes of bilateral lungs. This is consistent with multifocal pneumonia. 3. Minimal bilateral pleural effusions. 4. 1.3 cm nodule is identified in the central posterior left breast. Recommend further evaluation with diagnostic mammogram and ultrasound. Electronically Signed   By: Sherian Rein M.D.   On: 04/19/2020 09:32        Scheduled Meds:  dextromethorphan-guaiFENesin  1 tablet Oral BID   enoxaparin (LOVENOX) injection  40 mg Subcutaneous Q24H   simvastatin  20 mg Oral Daily   Continuous Infusions:  [START ON 04/20/2020] azithromycin     [START ON 04/20/2020] cefTRIAXone (ROCEPHIN)  IV       LOS: 0  days    Time spent: over 30 min    Lacretia Nicks, MD [facility]   To contact the attending provider between 7A-7P or the covering provider during after hours 7P-7A, please log into the web site [email or web address] and access using universal [place] password for that web site. If you do not have the password, please call the hospital operator.  04/19/2020, 5:28 PM

## 2020-04-19 NOTE — ED Notes (Signed)
Please call spouse Heather Koch for a status update at [phone]--Lesllie

## 2020-04-19 NOTE — ED Notes (Signed)
Attempted report to floor.  

## 2020-04-19 NOTE — ED Notes (Signed)
BREAKFAST ORDERED--Heather Koch  

## 2020-04-19 NOTE — Progress Notes (Signed)
I was the RN with Heather Matos LPN today and followed along with all charting and assessments today.  

## 2020-04-19 NOTE — H&P (Signed)
History and Physical  Heather Koch:[id number] DOB: Feb 19, 1951 DOA: 04/18/2020  Referring physician: Cy Blamer PCP: Burton Apley, MD  Patient coming from: Home  Chief Complaint: Shortness of breath  HPI: Heather Koch is a 69 y.o. female with medical history significant for rheumatoid arthritis who presents to the ED due to about 2 weeks onset of worsening generalized malaise, body aches, fever (up to 101-103F), increasing and worsening shortness of breath, nonproductive cough.  She went to her PCP and she was initially started on Keflex, but due to worsening of symptoms, this was changed to Augmentin after day 6 of Keflex.  Patient states that there was worsening of symptoms despite being on Augmentin.  Yesterday, she went up the stairs at home and by the time she got to the top of the stairs, patient states that she almost collapsed due to extreme shortness of breath and she realized that she needed to go to the ED for further evaluation and management.  Patient states that she never had COVID-19 pneumonia, and she states that she was already vaccinated against Covid since January 2021.  Patient denies loss of smell or loss of taste for food.  She denies chest pain, abdominal pain, blurry vision, diarrhea or constipation.  ED Course:  In the emergency department, temperature was 100.47F, she was tachypneic and tachycardic, patient was noted to be hypoxic with O2 sat of 86% on room air, but this improved on supplemental oxygen via [place] at 3 LPM (patient does not use supplemental oxygen at baseline).  Up in the ED showed mild leukocytosis and hyperglycemia, SARS coronavirus 2 was negative. Chest x-ray showed mild diffuse increased interstitial and groundglass opacity compared to prior suggesting interstitial inflammatory process/possible atypical infection.  She was empirically started on IV ceftriaxone and azithromycin.  IV Decadron 10 mg due to suspicion for atypical presentation of COVID-19  pneumonia was given.  Referral for MG x1 was given.  Hospitalist was asked to admit.  For further evaluation management.  Review of Systems: Constitutional: Positive for chills and fever.  HENT: Negative for ear pain and sore throat.   Eyes: Negative for pain and visual disturbance.  Respiratory: Positive for cough  and shortness of breath.   Cardiovascular: Negative for chest pain and palpitations.  Gastrointestinal: Negative for abdominal pain and vomiting.  Endocrine: Negative for polyphagia and polyuria.  Genitourinary: Negative for decreased urine volume, dysuria,  Musculoskeletal: Negative for arthralgias and back pain.  Skin: Negative for color change and rash.  Allergic/Immunologic: Negative for immunocompromised state.  Neurological: Positive for weakness, lightheadedness.  Negative for tremors, syncope, speech difficulty Hematological: Does not bruise/bleed easily.  All other systems reviewed and are negative   Past Medical History:  Diagnosis Date  . ASCUS (atypical squamous cells of undetermined significance) on Pap smear 12/2008   neg HR HPV  . LGSIL (low grade squamous intraepithelial dysplasia) 2005/2006  . Lichen sclerosus 2012  . Rheumatoid arthritis(714.0) 2012   Past Surgical History:  Procedure Laterality Date  . TUBAL LIGATION  1997    Social History:  reports that she has never smoked. She has never used smokeless tobacco. She reports that she does not drink alcohol and does not use drugs.   Allergies  Allergen Reactions  . Fish Allergy Other (See Comments)    Scallops  Reaction: Syncope    Family History  Problem Relation Age of Onset  . Hypertension Mother   . Diabetes Mother   . Cancer Father  liver  . Heart disease Sister   . Cancer Brother        pancreatic cancer  . Cancer Maternal Aunt        throat  . Heart disease Sister   . Heart disease Sister   . Cancer Maternal Uncle        Colon  . Heart disease Brother         pacemaker  . Healthy Son   . Healthy Son   . Healthy Daughter      Prior to Admission medications   Medication Sig Start Date End Date Taking? Authorizing Provider  ALPRAZolam Prudy Feeler) 1 MG tablet Take 0.25-1 mg by mouth at bedtime as needed for anxiety or sleep.    Yes [provider]  amoxicillin-clavulanate (AUGMENTIN) 875-125 MG tablet Take 1 tablet by mouth 2 (two) times daily. 04/16/20  Yes [provider]  clobetasol cream (TEMOVATE) 0.05 % APPLY TOPICALLY AT BEDTIME AS NEEDED FOR ITCHING Patient taking differently: Apply 1 application topically at bedtime as needed (Itching).  02/21/19  Yes Harrington Challenger, NP  diclofenac sodium (VOLTAREN) 1 % GEL Apply 3 gm to 3 large joints up to 3 times a day.Dispense 3 tubes with 3 refills. Patient taking differently: Apply 3 g topically 3 (three) times daily as needed (Joint pain).  01/19/18  Yes Deveshwar, Janalyn Rouse, MD  folic acid (FOLVITE) 1 MG tablet Take 2 tablets (2 mg total) by mouth daily. 10/04/19  Yes Deveshwar, Janalyn Rouse, MD  Methotrexate Sodium (METHOTREXATE, PF,) 250 MG/10ML injection INJECT 0.8ML INTO THE SKIN ONCE WEEKLY Patient taking differently: Inject 20 mg into the muscle once a week. Inject 0.83ml once a week. 02/26/20  Yes Deveshwar, Janalyn Rouse, MD  Multiple Vitamin (MULTIVITAMIN PO) Take 1 tablet by mouth daily.    Yes [provider]  simvastatin (ZOCOR) 20 MG tablet Take 1 tablet by mouth daily. 10/21/17  Yes [provider]  VITAMIN D PO Take 1 tablet by mouth daily.    Yes [provider]  TUBERCULIN SYR 1CC/27GX1/2" (B-D TB SYRINGE 1CC/27GX1/2") 27G X 1/2" 1 ML MISC use to inject methotrexate weekly 10/04/19   Pollyann Savoy, MD    Physical Exam: BP (!) 123/99   Pulse 88   Temp 98.7 F (37.1 C) (Oral)   Resp (!) 26   Ht 5\' 4"  (1.626 m)   Wt 68 kg   LMP 03/31/2003   SpO2 93%   BMI 25.75 kg/m   . General: 69 y.o. year-old female well developed well nourished in no acute distress.   Alert and oriented x3. 79 HEENT: NCAT, EOMI, PERRLA . Neck: Supple, trachea medial . Cardiovascular: Regular rate and rhythm with no rubs or gallops.  No thyromegaly or JVD noted.  No lower extremity edema. 2/4 pulses in all 4 extremities. Marland Kitchen Respiratory: Diffuse rhonchi bilaterally.  No use of accessory muscle.  . Abdomen: Soft nontender nondistended with normal bowel sounds x4 quadrants. . Muskuloskeletal: No cyanosis, clubbing or edema noted bilaterally . Neuro: CN II-XII intact, strength, sensation, reflexes . Skin: No ulcerative lesions noted or rashes . Psychiatry: Judgement and insight appear normal. Mood is appropriate for condition and setting          Labs on Admission:  Basic Metabolic Panel: Recent Labs  Lab 04/18/20 2231  NA 136  K 4.0  CL 99  CO2 26  GLUCOSE 135*  BUN 14  CREATININE 0.95  CALCIUM 9.5   Liver Function Tests: No results for input(s): AST,  ALT, ALKPHOS, BILITOT, PROT, ALBUMIN in the last 168 hours. No results for input(s): LIPASE, AMYLASE in the last 168 hours. No results for input(s): AMMONIA in the last 168 hours. CBC: Recent Labs  Lab 04/18/20 2231  WBC 11.7*  HGB 12.1  HCT 37.5  MCV 99.5  PLT 334   Cardiac Enzymes: No results for input(s): CKTOTAL, CKMB, CKMBINDEX, TROPONINI in the last 168 hours.  BNP (last 3 results) No results for input(s): BNP in the last 8760 hours.  ProBNP (last 3 results) No results for input(s): PROBNP in the last 8760 hours.  CBG: No results for input(s): GLUCAP in the last 168 hours.  Radiological Exams on Admission: DG Chest 2 View  Result Date: 04/18/2020 CLINICAL DATA:  Shortness of breath EXAM: CHEST - 2 VIEW COMPARISON:  04/16/2020 FINDINGS: No consolidation or pleural effusion. Mild diffuse increased interstitial and ground-glass opacity. Stable cardiomediastinal silhouette. No pneumothorax. IMPRESSION: Mild diffuse increased interstitial and ground-glass opacity compared to prior suggesting  interstitial inflammatory process/possible atypical infection versus less likely edema Electronically Signed   By: Jasmine Pang M.D.   On: 04/18/2020 23:00    EKG: I independently viewed the EKG done and my findings are as followed: Sinus tachycardia at a rate of 101/min  Assessment/Plan Present on Admission: . Atypical pneumonia . Rheumatoid arthritis involving multiple sites with positive rheumatoid factor (HCC)  Principal Problem:   Acute respiratory failure with hypoxia (HCC) Active Problems:   Rheumatoid arthritis involving multiple sites with positive rheumatoid factor (HCC)   Atypical pneumonia  Acute respiratory failure with hypoxia possibly secondary to atypical pneumonia Chest x-ray was positive for  interstitial inflammatory process/possible atypical infection. Patient has had symptoms for about 2 weeks and has taken oral antibiotics for about 10 days with worsening of symptoms. She was empirically started on IV ceftriaxone and azithromycin, we shall continue with same at this time with plan to de-escalate based on procalcitonin, blood culture and sputum culture Continue Mucinex, incentive spirometry, flutter valve  Continue Tylenol 650 MG p.o. every 6 hours as needed for fever Blood culture and sputum culture pending  Rheumatoid arthritis Stable, continue Tylenol 650 MG p.o. every 6 hours as needed for fever   DVT prophylaxis: Lovenox, SCDs  Code Status: Full code  Family Communication: None at bedside  Disposition Plan:  Patient is from:                        home Anticipated DC to:                   SNF or family members home Anticipated DC date:               2-3 days Anticipated DC barriers:         Response to treatment with improvement in symptoms.  Patient currently on supplemental oxygen, she does not use oxygen at baseline.   Consults called: None   Admission status: Inpatient patient severity of illness with worsening shortness of breath despite  10-day treatment of oral antibiotics as an outpatient.  Frankey Shown MD [facility]  If 7PM-7AM, please contact night-coverage [email or web address]  04/19/2020, 6:17 AM

## 2020-04-20 ENCOUNTER — Inpatient Hospital Stay (HOSPITAL_COMMUNITY): Payer: Medicare Other

## 2020-04-20 DIAGNOSIS — R7989 Other specified abnormal findings of blood chemistry: Secondary | ICD-10-CM

## 2020-04-20 LAB — CBC
HCT: 31.9 % — ABNORMAL LOW (ref 36.0–46.0)
Hemoglobin: 10.3 g/dL — ABNORMAL LOW (ref 12.0–15.0)
MCH: 32.1 pg (ref 26.0–34.0)
MCHC: 32.3 g/dL (ref 30.0–36.0)
MCV: 99.4 fL (ref 80.0–100.0)
Platelets: 287 10*3/uL (ref 150–400)
RBC: 3.21 MIL/uL — ABNORMAL LOW (ref 3.87–5.11)
RDW: 13.5 % (ref 11.5–15.5)
WBC: 12.2 10*3/uL — ABNORMAL HIGH (ref 4.0–10.5)
nRBC: 0 % (ref 0.0–0.2)

## 2020-04-20 LAB — COMPREHENSIVE METABOLIC PANEL
ALT: 33 U/L (ref 0–44)
AST: 29 U/L (ref 15–41)
Albumin: 2.2 g/dL — ABNORMAL LOW (ref 3.5–5.0)
Alkaline Phosphatase: 100 U/L (ref 38–126)
Anion gap: 11 (ref 5–15)
BUN: 13 mg/dL (ref 8–23)
CO2: 24 mmol/L (ref 22–32)
Calcium: 9.4 mg/dL (ref 8.9–10.3)
Chloride: 104 mmol/L (ref 98–111)
Creatinine, Ser: 0.78 mg/dL (ref 0.44–1.00)
GFR calc Af Amer: >60 mL/min (ref >60)
GFR calc non Af Amer: >60 mL/min (ref >60)
Glucose, Bld: 135 mg/dL — ABNORMAL HIGH (ref 70–99)
Potassium: 4.3 mmol/L (ref 3.5–5.1)
Sodium: 139 mmol/L (ref 135–145)
Total Bilirubin: 0.3 mg/dL (ref 0.3–1.2)
Total Protein: 6 g/dL — ABNORMAL LOW (ref 6.5–8.1)

## 2020-04-20 LAB — MAGNESIUM: Magnesium: 1.9 mg/dL (ref 1.7–2.4)

## 2020-04-20 LAB — HEMOGLOBIN A1C
Hgb A1c MFr Bld: 5.5 % (ref 4.8–5.6)
Mean Plasma Glucose: 111.15 mg/dL

## 2020-04-20 LAB — PHOSPHORUS: Phosphorus: 4.2 mg/dL (ref 2.5–4.6)

## 2020-04-20 LAB — PROTIME-INR
INR: 1.1 (ref 0.8–1.2)
Prothrombin Time: 14.2 seconds (ref 11.4–15.2)

## 2020-04-20 LAB — APTT: aPTT: 35 seconds (ref 24–36)

## 2020-04-20 MED ORDER — ALBUTEROL SULFATE (2.5 MG/3ML) 0.083% IN NEBU: 2.5000 mg | INHALATION_SOLUTION | RESPIRATORY_TRACT | Status: DC | PRN

## 2020-04-20 MED ORDER — ALBUTEROL SULFATE (2.5 MG/3ML) 0.083% IN NEBU
2.5000 mg | INHALATION_SOLUTION | Freq: Three times a day (TID) | RESPIRATORY_TRACT | Status: DC
  Administered 2020-04-20 – 2020-04-21 (×2): 2.5 mg via RESPIRATORY_TRACT
  Filled 2020-04-20 (×2): qty 3

## 2020-04-20 MED ORDER — ALPRAZOLAM 0.25 MG PO TABS
0.2500 mg | ORAL_TABLET | Freq: Once | ORAL | Status: AC | PRN
  Administered 2020-04-20: 0.25 mg via ORAL
  Filled 2020-04-20: qty 1

## 2020-04-20 MED ORDER — AZITHROMYCIN 250 MG PO TABS
500.0000 mg | ORAL_TABLET | Freq: Every day | ORAL | Status: DC
  Administered 2020-04-21 – 2020-04-23 (×3): 500 mg via ORAL
  Filled 2020-04-20 (×3): qty 2

## 2020-04-20 MED ORDER — HYDROCOD POLST-CPM POLST ER 10-8 MG/5ML PO SUER
5.0000 mL | Freq: Two times a day (BID) | ORAL | Status: DC | PRN
  Administered 2020-04-20 – 2020-04-21 (×2): 5 mL via ORAL
  Filled 2020-04-20 (×2): qty 5

## 2020-04-20 MED ORDER — SODIUM CHLORIDE 0.9 % IV SOLN
2.0000 g | INTRAVENOUS | Status: DC
  Administered 2020-04-21 – 2020-04-23 (×3): 2 g via INTRAVENOUS
  Filled 2020-04-20: qty 20
  Filled 2020-04-20: qty 2
  Filled 2020-04-20 (×2): qty 20

## 2020-04-20 MED ORDER — ALBUTEROL SULFATE (2.5 MG/3ML) 0.083% IN NEBU
2.5000 mg | INHALATION_SOLUTION | Freq: Four times a day (QID) | RESPIRATORY_TRACT | Status: DC
  Administered 2020-04-20 (×2): 2.5 mg via RESPIRATORY_TRACT
  Filled 2020-04-20: qty 3

## 2020-04-20 MED ORDER — ALBUTEROL SULFATE (2.5 MG/3ML) 0.083% IN NEBU
INHALATION_SOLUTION | RESPIRATORY_TRACT | Status: AC
  Filled 2020-04-20: qty 3

## 2020-04-20 MED ORDER — ALPRAZOLAM 0.25 MG PO TABS
0.2500 mg | ORAL_TABLET | Freq: Every evening | ORAL | Status: DC | PRN
  Administered 2020-04-22: 0.25 mg via ORAL
  Filled 2020-04-20: qty 1

## 2020-04-20 MED ORDER — ALPRAZOLAM 0.25 MG PO TABS
0.2500 mg | ORAL_TABLET | Freq: Once | ORAL | Status: AC
  Administered 2020-04-20: 0.25 mg via ORAL
  Filled 2020-04-20: qty 1

## 2020-04-20 NOTE — Progress Notes (Signed)
VASCULAR LAB    Bilateral lower extremity venous duplex completed.    Preliminary report:  See CV proc for preliminary results.  Taylor Levick, RVT 04/20/2020, 11:22 AM

## 2020-04-20 NOTE — Progress Notes (Signed)
PROGRESS NOTE    Heather Koch  ZOX:[id number] DOB: [birth year] DOA: 04/18/2020 PCP: Heather Apley, MD   Chief Complaint  Patient presents with  . Shortness of Breath    Brief Narrative:  Heather Koch is Heather Koch 69 y.o. female with medical history significant for rheumatoid arthritis who presents to the ED due to about 2 weeks onset of worsening generalized malaise, body aches, fever (up to 101-103F), increasing and worsening shortness of breath, nonproductive cough.  She went to her PCP and she was initially started on Keflex, but due to worsening of symptoms, this was changed to Augmentin after day 6 of Keflex.  Patient states that there was worsening of symptoms despite being on Augmentin.  Yesterday, she went up the stairs at home and by the time she got to the top of the stairs, patient states that she almost collapsed due to extreme shortness of breath and she realized that she needed to go to the ED for further evaluation and management.  Patient states that she never had COVID-19 pneumonia, and she states that she was already vaccinated against Covid since January 2021.  Patient denies loss of smell or loss of taste for food.  She denies chest pain, abdominal pain, blurry vision, diarrhea or constipation.  ED Course:  In the emergency department, temperature was 100.49F, she was tachypneic and tachycardic, patient was noted to be hypoxic with O2 sat of 86% on room air, but this improved on supplemental oxygen via [place] at 3 LPM (patient does not use supplemental oxygen at baseline).  Up in the ED showed mild leukocytosis and hyperglycemia, SARS coronavirus 2 was negative. Chest x-ray showed mild diffuse increased interstitial and groundglass opacity compared to prior suggesting interstitial inflammatory process/possible atypical infection.  She was empirically started on IV ceftriaxone and azithromycin.  IV Decadron 10 mg due to suspicion for atypical presentation of COVID-19 pneumonia was given.   Referral for MG x1 was given.  Hospitalist was asked to admit.  For further evaluation management.  Assessment & Plan:   Principal Problem:   Acute respiratory failure with hypoxia (HCC) Active Problems:   Rheumatoid arthritis involving multiple sites with positive rheumatoid factor (HCC)   Atypical pneumonia  Acute respiratory failure with Hypoxia  Community Acquired Pneumonia  Multifocal Pneumonia  2 weeks sx, progressive fever, SOB, cough - she's been on abx for about 10 days prior to presentation (keflex -> augmentin)  CXR with mild diffuse interstitial and ggo suggestion interstitial inflammatory process - atypical infection vs less likely edema CT PE protocol with patchy consolidation throughout all lobes of bilateral lungs c/w multifocal pneumonia, minimal bilateral pleural effusions Continue ceftriaxone, azithromycin  Follow sputum cx, urine strep (negative), urine legionella, MRSA pcr. Follow RVP (negative).  COVID negative x2 (she's been vaccinated). Albuterol prn IS, flutter, OOB, respiratory c/s   Rheumatoid arthritis Stable, continue Tylenol 650 MG p.o. every 6 hours as needed for fever  1.3 cm Nodule in Central Posterior L Breast: she needs further eval with mammogram and [place] as outpatient   Elevated D dimer; negative CT PE, follow LE [place], no LE swelling - likely 2/2 pneumonia  DVT prophylaxis: lovenox Code Status: full  Family Communication: husband at bedsdie Disposition:   Status is: Inpatient  Remains inpatient appropriate because:Inpatient level of care appropriate due to severity of illness   Dispo: The patient is from: Home              Anticipated d/c is to: Home  Anticipated d/c date is: > 3 days              Patient currently is not medically stable to d/c.   Consultants:   none  Procedures:   none  Antimicrobials:  Anti-infectives (From admission, onward)   Start     Dose/Rate Route Frequency Ordered Stop   04/20/20 0600   azithromycin (ZITHROMAX) 500 mg in sodium chloride 0.9 % 250 mL IVPB     Discontinue     500 mg 250 mL/hr over 60 Minutes Intravenous Every 24 hours 04/19/20 0627     04/20/20 0500  cefTRIAXone (ROCEPHIN) 1 g in sodium chloride 0.9 % 100 mL IVPB     Discontinue     1 g 200 mL/hr over 30 Minutes Intravenous Every 24 hours 04/19/20 0627     04/19/20 0445  cefTRIAXone (ROCEPHIN) 1 g in sodium chloride 0.9 % 100 mL IVPB        1 g 200 mL/hr over 30 Minutes Intravenous  Once 04/19/20 0432 04/19/20 0534   04/19/20 0445  azithromycin (ZITHROMAX) 500 mg in sodium chloride 0.9 % 250 mL IVPB        500 mg 250 mL/hr over 60 Minutes Intravenous  Once 04/19/20 0432 04/19/20 0756     Subjective: Feeling progressively better Still with cough  Objective: Vitals:   04/19/20 1744 04/19/20 2115 04/20/20 0401 04/20/20 0806  BP: 113/60 109/62 119/69 120/60  Pulse: 75 70 65   Resp: 18 18 18 19   Temp: 98.2 F (36.8 C) 97.8 F (36.6 C) 97.8 F (36.6 C) 97.7 F (36.5 C)  TempSrc: Oral Oral Oral Oral  SpO2: 92% 95% 98% 98%  Weight:      Height:        Intake/Output Summary (Last 24 hours) at 04/20/2020 0841 Last data filed at 04/20/2020 0600 Gross per 24 hour  Intake 378.64 ml  Output 900 ml  Net -521.36 ml   Filed Weights   04/19/20 0346  Weight: 68 kg    Examination:  General: No acute distress. Cardiovascular: Heart sounds show Heather Koch regular rate, and rhythm.  Lungs: faint scattered end expiratory pip Abdomen: Soft, nontender, nondistended  Neurological: Alert and oriented 3. Moves all extremities 4. Cranial nerves II through XII grossly intact. Skin: Warm and dry. No rashes or lesions. Extremities: No clubbing or cyanosis. No edema.   Data Reviewed: I have personally reviewed following labs and imaging studies  CBC: Recent Labs  Lab 04/18/20 2231 04/19/20 0653 04/20/20 0326  WBC 11.7* 12.3* 12.2*  HGB 12.1 10.8* 10.3*  HCT 37.5 33.3* 31.9*  MCV 99.5 99.1 99.4  PLT 334 298 287     Basic Metabolic Panel: Recent Labs  Lab 04/18/20 2231 04/19/20 0653 04/20/20 0326  NA 136 134* 139  K 4.0 4.0 4.3  CL 99 99 104  CO2 26 22 24   GLUCOSE 135* 143* 135*  BUN 14 11 13   CREATININE 0.95 0.83 0.78  CALCIUM 9.5 9.3 9.4  MG  --   --  1.9  PHOS  --   --  4.2    GFR: Estimated Creatinine Clearance: 64.6 mL/min (by C-G formula based on SCr of 0.78 mg/dL).  Liver Function Tests: Recent Labs  Lab 04/19/20 0653 04/20/20 0326  AST 49* 29  ALT 42 33  ALKPHOS 118 100  BILITOT 0.5 0.3  PROT 6.3* 6.0*  ALBUMIN 2.5* 2.2*    CBG: No results for input(s): GLUCAP in the last 168 hours.  Recent Results (from the past 240 hour(s))  SARS Coronavirus 2 by RT PCR (hospital order, performed in [facility] hospital lab) Nasopharyngeal Nasopharyngeal Swab     Status: None   Collection Time: 04/19/20  3:18 AM   Specimen: Nasopharyngeal Swab  Result Value Ref Range Status   SARS Coronavirus 2 NEGATIVE NEGATIVE Final    Comment: (NOTE) SARS-CoV-2 target nucleic acids are NOT DETECTED.  The SARS-CoV-2 RNA is generally detectable in upper and lower respiratory specimens during the acute phase of infection. The lowest concentration of SARS-CoV-2 viral copies this assay can detect is 250 copies / mL. Shaneya Taketa negative result does not preclude SARS-CoV-2 infection and should not be used as the sole basis for treatment or other patient management decisions.  Ailynn Gow negative result may occur with improper specimen collection / handling, submission of specimen other than nasopharyngeal swab, presence of viral mutation(s) within the areas targeted by this assay, and inadequate number of viral copies (<250 copies / mL). Tanay Misuraca negative result must be combined with clinical observations, patient history, and epidemiological information.  Fact Sheet for Patients:   BoilerBrush.com.cy  Fact Sheet for Healthcare Providers: https://pope.com/  This  test is not yet approved or  cleared by the [place] FDA and has been authorized for detection and/or diagnosis of SARS-CoV-2 by FDA under an Emergency Use Authorization (EUA).  This EUA will remain in effect (meaning this test can be used) for the duration of the COVID-19 declaration under Section 564(b)(1) of the Act, 21 U.S.C. section 360bbb-3(b)(1), unless the authorization is terminated or revoked sooner.  Performed at [facility], [address], [place], [place] [address]   Respiratory Panel by PCR     Status: None   Collection Time: 04/19/20 10:35 AM   Specimen: Nasopharyngeal Swab; Respiratory  Result Value Ref Range Status   Adenovirus NOT DETECTED NOT DETECTED Final   Coronavirus 229E NOT DETECTED NOT DETECTED Final    Comment: (NOTE) The Coronavirus on the Respiratory Panel, DOES NOT test for the novel  Coronavirus (2019 nCoV)    Coronavirus HKU1 NOT DETECTED NOT DETECTED Final   Coronavirus NL63 NOT DETECTED NOT DETECTED Final   Coronavirus OC43 NOT DETECTED NOT DETECTED Final   Metapneumovirus NOT DETECTED NOT DETECTED Final   Rhinovirus / Enterovirus NOT DETECTED NOT DETECTED Final   Influenza Dillian Feig NOT DETECTED NOT DETECTED Final   Influenza B NOT DETECTED NOT DETECTED Final   Parainfluenza Virus 1 NOT DETECTED NOT DETECTED Final   Parainfluenza Virus 2 NOT DETECTED NOT DETECTED Final   Parainfluenza Virus 3 NOT DETECTED NOT DETECTED Final   Parainfluenza Virus 4 NOT DETECTED NOT DETECTED Final   Respiratory Syncytial Virus NOT DETECTED NOT DETECTED Final   Bordetella pertussis NOT DETECTED NOT DETECTED Final   Chlamydophila pneumoniae NOT DETECTED NOT DETECTED Final   Mycoplasma pneumoniae NOT DETECTED NOT DETECTED Final    Comment: Performed at [facility], [address] [place], [place] [address]  SARS Coronavirus 2 by RT PCR (hospital order, performed in [facility] hospital lab) Nasopharyngeal Nasopharyngeal Swab     Status: None    Collection Time: 04/19/20 10:35 AM   Specimen: Nasopharyngeal Swab  Result Value Ref Range Status   SARS Coronavirus 2 NEGATIVE NEGATIVE Final    Comment: (NOTE) SARS-CoV-2 target nucleic acids are NOT DETECTED.  The SARS-CoV-2 RNA is generally detectable in upper and lower respiratory specimens during the acute phase of infection. The lowest concentration of SARS-CoV-2 viral copies  this assay can detect is 250 copies / mL. Holton Sidman negative result does not preclude SARS-CoV-2 infection and should not be used as the sole basis for treatment or other patient management decisions.  Monae Topping negative result may occur with improper specimen collection / handling, submission of specimen other than nasopharyngeal swab, presence of viral mutation(s) within the areas targeted by this assay, and inadequate number of viral copies (<250 copies / mL). Doralene Glanz negative result must be combined with clinical observations, patient history, and epidemiological information.  Fact Sheet for Patients:   BoilerBrush.com.cy  Fact Sheet for Healthcare Providers: https://pope.com/  This test is not yet approved or  cleared by the [place] FDA and has been authorized for detection and/or diagnosis of SARS-CoV-2 by FDA under an Emergency Use Authorization (EUA).  This EUA will remain in effect (meaning this test can be used) for the duration of the COVID-19 declaration under Section 564(b)(1) of the Act, 21 U.S.C. section 360bbb-3(b)(1), unless the authorization is terminated or revoked sooner.  Performed at [facility], [address] [place], [place] [address]          Radiology Studies: DG Chest 2 View  Result Date: 04/18/2020 CLINICAL DATA:  Shortness of breath EXAM: CHEST - 2 VIEW COMPARISON:  04/16/2020 FINDINGS: No consolidation or pleural effusion. Mild diffuse increased interstitial and ground-glass opacity. Stable cardiomediastinal silhouette. No  pneumothorax. IMPRESSION: Mild diffuse increased interstitial and ground-glass opacity compared to prior suggesting interstitial inflammatory process/possible atypical infection versus less likely edema Electronically Signed   By: Jasmine Pang M.D.   On: 04/18/2020 23:00   CT ANGIO CHEST PE W OR WO CONTRAST  Result Date: 04/19/2020 CLINICAL DATA:  Shortness of breath for 1 week EXAM: CT ANGIOGRAPHY CHEST WITH CONTRAST TECHNIQUE: Multidetector CT imaging of the chest was performed using the standard protocol during bolus administration of intravenous contrast. Multiplanar CT image reconstructions and MIPs were obtained to evaluate the vascular anatomy. CONTRAST:  75mL OMNIPAQUE IOHEXOL 350 MG/ML SOLN COMPARISON:  Chest x-ray April 18, 2020 FINDINGS: Cardiovascular: Satisfactory opacification of the pulmonary arteries to the segmental level. No evidence of pulmonary embolism. Normal heart size. No pericardial effusion. Mediastinum/Nodes: No enlarged mediastinal, hilar, or axillary lymph nodes. Thyroid gland, trachea, and esophagus demonstrate no significant findings. Lungs/Pleura: Patchy consolidation is identified throughout all lobes of bilateral lungs. Minimal bilateral pleural effusions are noted. Upper Abdomen: No acute abnormality. Musculoskeletal: No acute abnormality is identified. Degenerative joint changes of the spine are noted. 1.3 cm nodule is identified in the central posterior left breast series 5, image 48. Review of the MIP images confirms the above findings. IMPRESSION: 1. No pulmonary embolus. 2. Patchy consolidation is identified throughout all lobes of bilateral lungs. This is consistent with multifocal pneumonia. 3. Minimal bilateral pleural effusions. 4. 1.3 cm nodule is identified in the central posterior left breast. Recommend further evaluation with diagnostic mammogram and ultrasound. Electronically Signed   By: Sherian Rein M.D.   On: 04/19/2020 09:32        Scheduled Meds: .  albuterol  2.5 mg Nebulization Q6H  . dextromethorphan-guaiFENesin  1 tablet Oral BID  . enoxaparin (LOVENOX) injection  40 mg Subcutaneous Q24H  . simvastatin  20 mg Oral Daily   Continuous Infusions: . azithromycin 500 mg (04/20/20 0321)  . cefTRIAXone (ROCEPHIN)  IV 1 g (04/20/20 0131)     LOS: 1 day    Time spent: over 30 min    Lacretia Nicks, MD [facility]  To contact the attending provider between 7A-7P or the covering provider during after hours 7P-7A, please log into the web site [email or web address] and access using universal [place] password for that web site. If you do not have the password, please call the hospital operator.  04/20/2020, 8:41 AM

## 2020-04-20 NOTE — Plan of Care (Signed)
New care plan 

## 2020-04-20 NOTE — Progress Notes (Signed)
PHARMACIST - PHYSICIAN COMMUNICATION DR:   Lowell Guitar CONCERNING: Antibiotic IV to Oral Route Change Policy  RECOMMENDATION: This patient is receiving azithromycin by the intravenous route.  Based on criteria approved by the Pharmacy and Therapeutics Committee, the antibiotic(s) is/are being converted to the equivalent oral dose form(s).   DESCRIPTION: These criteria include:  Patient being treated for a respiratory tract infection, urinary tract infection, cellulitis or clostridium difficile associated diarrhea if on metronidazole  The patient is not neutropenic and does not exhibit a GI malabsorption state  The patient is eating (either orally or via tube) and/or has been taking other orally administered medications for a least 24 hours  The patient is improving clinically and has a Tmax < 100.5  If you have questions about this conversion, please contact the Pharmacy Department  []   [phone] )  ( [phone] []   [phone] )  [facility] [x]   [phone] )  [facility] []   [phone] )  [facility] []   [phone] )  [facility]

## 2020-04-21 ENCOUNTER — Inpatient Hospital Stay (HOSPITAL_COMMUNITY): Payer: Medicare Other

## 2020-04-21 DIAGNOSIS — I480 Paroxysmal atrial fibrillation: Secondary | ICD-10-CM

## 2020-04-21 LAB — COMPREHENSIVE METABOLIC PANEL
ALT: 32 U/L (ref 0–44)
AST: 27 U/L (ref 15–41)
Albumin: 2.2 g/dL — ABNORMAL LOW (ref 3.5–5.0)
Alkaline Phosphatase: 91 U/L (ref 38–126)
Anion gap: 9 (ref 5–15)
BUN: 17 mg/dL (ref 8–23)
CO2: 26 mmol/L (ref 22–32)
Calcium: 9.6 mg/dL (ref 8.9–10.3)
Chloride: 106 mmol/L (ref 98–111)
Creatinine, Ser: 0.83 mg/dL (ref 0.44–1.00)
GFR calc Af Amer: >60 mL/min (ref >60)
GFR calc non Af Amer: >60 mL/min (ref >60)
Glucose, Bld: 100 mg/dL — ABNORMAL HIGH (ref 70–99)
Potassium: 4.2 mmol/L (ref 3.5–5.1)
Sodium: 141 mmol/L (ref 135–145)
Total Bilirubin: 0.3 mg/dL (ref 0.3–1.2)
Total Protein: 5.8 g/dL — ABNORMAL LOW (ref 6.5–8.1)

## 2020-04-21 LAB — CBC WITH DIFFERENTIAL/PLATELET
Abs Immature Granulocytes: 0.04 10*3/uL (ref 0.00–0.07)
Basophils Absolute: 0 10*3/uL (ref 0.0–0.1)
Basophils Relative: 0 %
Eosinophils Absolute: 0.1 10*3/uL (ref 0.0–0.5)
Eosinophils Relative: 1 %
HCT: 32.1 % — ABNORMAL LOW (ref 36.0–46.0)
Hemoglobin: 10.1 g/dL — ABNORMAL LOW (ref 12.0–15.0)
Immature Granulocytes: 0 %
Lymphocytes Relative: 25 %
Lymphs Abs: 2.7 10*3/uL (ref 0.7–4.0)
MCH: 31.5 pg (ref 26.0–34.0)
MCHC: 31.5 g/dL (ref 30.0–36.0)
MCV: 100 fL (ref 80.0–100.0)
Monocytes Absolute: 1 10*3/uL (ref 0.1–1.0)
Monocytes Relative: 9 %
Neutro Abs: 7.1 10*3/uL (ref 1.7–7.7)
Neutrophils Relative %: 65 %
Platelets: 324 10*3/uL (ref 150–400)
RBC: 3.21 MIL/uL — ABNORMAL LOW (ref 3.87–5.11)
RDW: 13.7 % (ref 11.5–15.5)
WBC: 10.8 10*3/uL — ABNORMAL HIGH (ref 4.0–10.5)
nRBC: 0 % (ref 0.0–0.2)

## 2020-04-21 LAB — TROPONIN I (HIGH SENSITIVITY)
Troponin I (High Sensitivity): 7 ng/L (ref <18)
Troponin I (High Sensitivity): 8 ng/L (ref <18)

## 2020-04-21 LAB — LEGIONELLA PNEUMOPHILA SEROGP 1 UR AG: L. pneumophila Serogp 1 Ur Ag: NEGATIVE

## 2020-04-21 LAB — ECHOCARDIOGRAM COMPLETE
Height: 64 in
Weight: 2400 oz

## 2020-04-21 LAB — BRAIN NATRIURETIC PEPTIDE: B Natriuretic Peptide: 148.3 pg/mL — ABNORMAL HIGH (ref 0.0–100.0)

## 2020-04-21 LAB — TSH: TSH: 1.424 u[IU]/mL (ref 0.350–4.500)

## 2020-04-21 LAB — MAGNESIUM: Magnesium: 1.8 mg/dL (ref 1.7–2.4)

## 2020-04-21 LAB — PHOSPHORUS: Phosphorus: 4.6 mg/dL (ref 2.5–4.6)

## 2020-04-21 LAB — HEPARIN LEVEL (UNFRACTIONATED): Heparin Unfractionated: 0.33 IU/mL (ref 0.30–0.70)

## 2020-04-21 MED ORDER — HEPARIN (PORCINE) 25000 UT/250ML-% IV SOLN
1050.0000 [IU]/h | INTRAVENOUS | Status: DC
  Administered 2020-04-21: 1000 [IU]/h via INTRAVENOUS
  Administered 2020-04-22: 1050 [IU]/h via INTRAVENOUS
  Filled 2020-04-21 (×2): qty 250

## 2020-04-21 MED ORDER — DILTIAZEM HCL-DEXTROSE 125-5 MG/125ML-% IV SOLN (PREMIX)
5.0000 mg/h | INTRAVENOUS | Status: AC
  Administered 2020-04-21 – 2020-04-22 (×2): 5 mg/h via INTRAVENOUS
  Filled 2020-04-21 (×2): qty 125

## 2020-04-21 MED ORDER — METOPROLOL TARTRATE 5 MG/5ML IV SOLN
INTRAVENOUS | Status: AC
  Administered 2020-04-21: 5 mg via INTRAVENOUS
  Filled 2020-04-21: qty 5

## 2020-04-21 MED ORDER — HEPARIN BOLUS VIA INFUSION
4000.0000 [IU] | Freq: Once | INTRAVENOUS | Status: AC
  Administered 2020-04-21: 4000 [IU] via INTRAVENOUS
  Filled 2020-04-21: qty 4000

## 2020-04-21 MED ORDER — METOPROLOL TARTRATE 5 MG/5ML IV SOLN
5.0000 mg | INTRAVENOUS | Status: AC | PRN
  Administered 2020-04-21 (×2): 5 mg via INTRAVENOUS
  Filled 2020-04-21 (×2): qty 5

## 2020-04-21 MED ORDER — LEVALBUTEROL HCL 0.63 MG/3ML IN NEBU: 0.6300 mg | INHALATION_SOLUTION | Freq: Four times a day (QID) | RESPIRATORY_TRACT | Status: DC | PRN

## 2020-04-21 NOTE — Progress Notes (Signed)
ANTICOAGULATION CONSULT NOTE - Follow-up Consult  Pharmacy Consult for Heparin Indication: atrial fibrillation  Allergies  Allergen Reactions   Fish Allergy Other (See Comments)    Scallops  Reaction: Syncope    Patient Measurements: Height: 5\' 4"  (162.6 cm) Weight: 68 kg (150 lb) IBW/kg (Calculated) : 54.7 Heparin Dosing Weight: 68kg  Vital Signs: Temp: 99.4 F (37.4 C) (07/05 1604) BP: 92/48 (07/05 1604) Pulse Rate: 72 (07/05 1604)  Labs: Recent Labs    04/18/20 2231 04/19/20 0049 04/19/20 0653 04/19/20 0653 04/20/20 0326 04/21/20 0612 04/21/20 1054 04/21/20 1237 04/21/20 1855  HGB   < >  --  10.8*   < > 10.3* 10.1*  --   --   --   HCT   < >  --  33.3*  --  31.9* 32.1*  --   --   --   PLT   < >  --  298  --  287 324  --   --   --   APTT  --   --   --   --  35  --   --   --   --   LABPROT  --   --   --   --  14.2  --   --   --   --   INR  --   --   --   --  1.1  --   --   --   --   HEPARINUNFRC  --   --   --   --   --   --   --   --  0.33  CREATININE   < >  --  0.83  --  0.78 0.83  --   --   --   TROPONINIHS  --  8  --   --   --   --  7 8  --    < > = values in this interval not displayed.    Estimated Creatinine Clearance: 62.3 mL/min (by C-G formula based on SCr of 0.83 mg/dL).   Medical History: Past Medical History:  Diagnosis Date   ASCUS (atypical squamous cells of undetermined significance) on Pap smear 12/2008   neg HR HPV   LGSIL (low grade squamous intraepithelial dysplasia) 2005/2006   Lichen sclerosus 2012   Rheumatoid arthritis(714.0) 2012    Assessment: 69 year old female with new atrial fibrillation/flutter with CHADSVASC 2 to start IV Heparin therapy per pharmacy consult.   Hgb 10.1, Plts 324. Baseline INR 1.1. Not on anticoagulation prior to admission. Received Lovenox 40mg  daily for DVT prophylaxis since admission - last dose 7/4 at 08:34 AM. No signs or symptoms of bleeding reported.   Noted start delayed due to access issues.  Heparin level this evening is therapeutic but on the lower end of goal range (HL 0.33, goal of 0.3-0.7).   Goal of Therapy:  Heparin level 0.3-0.7 units/ml Monitor platelets by anticoagulation protocol: Yes   Plan:  - Increase Heparin drip 1050 units/hr (10.5 ml/hr) to keep within range - Will continue to monitor for any signs/symptoms of bleeding and will follow up with heparin level in 6 hours to confirm therapeutic  Thank you for allowing pharmacy to be a part of this patients care.  79, PharmD, BCPS Clinical Pharmacist Clinical phone for 04/21/2020: Georgina Pillion 04/21/2020 8:17 PM   **Pharmacist phone directory can now be found on [email or web address] ([email or web address]).  Listed under [facility].

## 2020-04-21 NOTE — Progress Notes (Signed)
  Echocardiogram 2D Echocardiogram has been performed.  Heather Koch 04/21/2020, 10:58 AM

## 2020-04-21 NOTE — Progress Notes (Addendum)
ANTICOAGULATION CONSULT NOTE - Initial Consult  Pharmacy Consult for Heparin Indication: atrial fibrillation  Allergies  Allergen Reactions  . Fish Allergy Other (See Comments)    Scallops  Reaction: Syncope    Patient Measurements: Height: 5\' 4"  (162.6 cm) Weight: 68 kg (150 lb) IBW/kg (Calculated) : 54.7 Heparin Dosing Weight: 68kg  Vital Signs: Temp: 98.3 F (36.8 C) (07/05 0808) Temp Source: Oral (07/05 0808) BP: 123/92 (07/05 0903) Pulse Rate: 94 (07/05 0815)  Labs: Recent Labs    04/18/20 2231 04/18/20 2231 04/19/20 0049 04/19/20 06/20/20 04/19/20 0653 04/20/20 0326 04/21/20 0612  HGB 12.1   < >  --  10.8*   < > 10.3* 10.1*  HCT 37.5   < >  --  33.3*  --  31.9* 32.1*  PLT 334   < >  --  298  --  287 324  APTT  --   --   --   --   --  35  --   LABPROT  --   --   --   --   --  14.2  --   INR  --   --   --   --   --  1.1  --   CREATININE 0.95   < >  --  0.83  --  0.78 0.83  TROPONINIHS 6  --  8  --   --   --   --    < > = values in this interval not displayed.    Estimated Creatinine Clearance: 62.3 mL/min (by C-G formula based on SCr of 0.83 mg/dL).   Medical History: Past Medical History:  Diagnosis Date  . ASCUS (atypical squamous cells of undetermined significance) on Pap smear 12/2008   neg HR HPV  . LGSIL (low grade squamous intraepithelial dysplasia) 2005/2006  . Lichen sclerosus 2012  . Rheumatoid arthritis(714.0) 2012    Assessment: 69 year old female with new atrial fibrillation/flutter with CHADSVASC 2 to start IV Heparin therapy per pharmacy consult.   Hgb 10.1, Plts 324. Baseline INR 1.1. Not on anticoagulation prior to admission. Received Lovenox 40mg  daily for DVT prophylaxis since admission - last dose 7/4 at 08:34 AM. No signs or symptoms of bleeding reported.   Goal of Therapy:  Heparin level 0.3-0.7 units/ml Monitor platelets by anticoagulation protocol: Yes   Plan:  Heparin bolus of 4000 units x1, then start Heparin drip at 1000  units/hr.  Heparin level in 6 hours.  Daily Heparin level and CBC.  Addendum: Heparin start delayed due to access issues. Will follow along to retime orders when access is available.    79, PharmD, BCPS, BCCCP Clinical Pharmacist Please refer to [facility] for [facility] Pharmacy numbers 04/21/2020,10:01 AM

## 2020-04-21 NOTE — Progress Notes (Signed)
PROGRESS NOTE    Heather Koch  ZOX:[id number] DOB: 05-Jul-1951 DOA: 04/18/2020 PCP: Burton Apley, MD   Chief Complaint  Patient presents with  . Shortness of Breath    Brief Narrative:  Heather Koch is Heather Koch 69 y.o. female with medical history significant for rheumatoid arthritis who presents to the ED due to about 2 weeks onset of worsening generalized malaise, body aches, fever (up to 101-103F), increasing and worsening shortness of breath, nonproductive cough.  She went to her PCP and she was initially started on Keflex, but due to worsening of symptoms, this was changed to Augmentin after day 6 of Keflex.  Patient states that there was worsening of symptoms despite being on Augmentin.  Yesterday, she went up the stairs at home and by the time she got to the top of the stairs, patient states that she almost collapsed due to extreme shortness of breath and she realized that she needed to go to the ED for further evaluation and management.  Patient states that she never had COVID-19 pneumonia, and she states that she was already vaccinated against Covid since January 2021.  Patient denies loss of smell or loss of taste for food.  She denies chest pain, abdominal pain, blurry vision, diarrhea or constipation.  ED Course:  In the emergency department, temperature was 100.59F, she was tachypneic and tachycardic, patient was noted to be hypoxic with O2 sat of 86% on room air, but this improved on supplemental oxygen via [place] at 3 LPM (patient does not use supplemental oxygen at baseline).  Up in the ED showed mild leukocytosis and hyperglycemia, SARS coronavirus 2 was negative. Chest x-ray showed mild diffuse increased interstitial and groundglass opacity compared to prior suggesting interstitial inflammatory process/possible atypical infection.  She was empirically started on IV ceftriaxone and azithromycin.  IV Decadron 10 mg due to suspicion for atypical presentation of COVID-19 pneumonia was given.   Referral for MG x1 was given.  Hospitalist was asked to admit.  For further evaluation management.  Assessment & Plan:   Principal Problem:   Acute respiratory failure with hypoxia (HCC) Active Problems:   Rheumatoid arthritis involving multiple sites with positive rheumatoid factor (HCC)   Atypical pneumonia  SVT  Afib/Flutter: fib/flutter this AM.  No prior hx, but does note hx of palpitations for years.   Metop prn for HR sustained >120 -> if still sustaining >120 after 3 doses, will transition to dilt gtt Echo, TSH Chadsvasc is 2 for age/female -> discussed anticoagulation risk/benefits - start heparin gtt Consider cardiology c/s    Acute respiratory failure with Hypoxia  Community Acquired Pneumonia  Multifocal Pneumonia  2 weeks sx, progressive fever, SOB, cough - she's been on abx for about 10 days prior to presentation (keflex -> augmentin)  CXR with mild diffuse interstitial and ggo suggestion interstitial inflammatory process - atypical infection vs less likely edema CT PE protocol with patchy consolidation throughout all lobes of bilateral lungs c/w multifocal pneumonia, minimal bilateral pleural effusions Continue ceftriaxone, azithromycin  Follow sputum cx, urine strep (negative), urine legionella, MRSA pcr. Follow RVP (negative).  COVID negative x2 (she's been vaccinated). Xopenex prn CXR 7/5 pending IS, flutter, OOB, respiratory c/s   Rheumatoid arthritis Stable, continue Tylenol 650 MG p.o. every 6 hours as needed for fever  1.3 cm Nodule in Central Posterior L Breast: she needs further eval with mammogram and [place] as outpatient   Elevated D dimer; negative CT PE, follow LE [place], no LE swelling - likely  2/2 pneumonia  DVT prophylaxis: lovenox Code Status: full  Family Communication: none at bedsdie Disposition:   Status is: Inpatient  Remains inpatient appropriate because:Inpatient level of care appropriate due to severity of illness   Dispo: The patient is  from: Home              Anticipated d/c is to: Home              Anticipated d/c date is: > 3 days              Patient currently is not medically stable to d/c.   Consultants:   none  Procedures:   none  Antimicrobials:  Anti-infectives (From admission, onward)   Start     Dose/Rate Route Frequency Ordered Stop   04/21/20 0700  azithromycin (ZITHROMAX) tablet 500 mg     Discontinue     500 mg Oral Daily with breakfast 04/20/20 0956     04/21/20 0500  cefTRIAXone (ROCEPHIN) 2 g in sodium chloride 0.9 % 100 mL IVPB     Discontinue     2 g 200 mL/hr over 30 Minutes Intravenous Every 24 hours 04/20/20 0954     04/20/20 0600  azithromycin (ZITHROMAX) 500 mg in sodium chloride 0.9 % 250 mL IVPB  Status:  Discontinued        500 mg 250 mL/hr over 60 Minutes Intravenous Every 24 hours 04/19/20 0627 04/20/20 0956   04/20/20 0500  cefTRIAXone (ROCEPHIN) 1 g in sodium chloride 0.9 % 100 mL IVPB  Status:  Discontinued        1 g 200 mL/hr over 30 Minutes Intravenous Every 24 hours 04/19/20 0627 04/20/20 0954   04/19/20 0445  cefTRIAXone (ROCEPHIN) 1 g in sodium chloride 0.9 % 100 mL IVPB        1 g 200 mL/hr over 30 Minutes Intravenous  Once 04/19/20 0432 04/19/20 0534   04/19/20 0445  azithromycin (ZITHROMAX) 500 mg in sodium chloride 0.9 % 250 mL IVPB        500 mg 250 mL/hr over 60 Minutes Intravenous  Once 04/19/20 0432 04/19/20 0756     Subjective: C/o persistent Sob No CP, doesn't feel well in general  Objective: Vitals:   04/21/20 0808 04/21/20 0815 04/21/20 0855 04/21/20 0903  BP: (!) 121/102  (!) 134/98 (!) 123/92  Pulse: 99 94    Resp:  18    Temp: 98.3 F (36.8 C)     TempSrc: Oral     SpO2: 91% 93%    Weight:      Height:       No intake or output data in the 24 hours ending 04/21/20 0912 Filed Weights   04/19/20 0346  Weight: 68 kg    Examination:  General: No acute distress. Cardiovascular: tachycardic, irregularly irregular  Lungs: scattered crackles  at bases Abdomen: Soft, nontender, nondistended Neurological: Alert and oriented 3. Moves all extremities 4 . Cranial nerves II through XII grossly intact. Skin: Warm and dry. No rashes or lesions. Extremities: No clubbing or cyanosis. No edema.   Data Reviewed: I have personally reviewed following labs and imaging studies  CBC: Recent Labs  Lab 04/18/20 2231 04/19/20 0653 04/20/20 0326 04/21/20 0612  WBC 11.7* 12.3* 12.2* 10.8*  NEUTROABS  --   --   --  7.1  HGB 12.1 10.8* 10.3* 10.1*  HCT 37.5 33.3* 31.9* 32.1*  MCV 99.5 99.1 99.4 100.0  PLT 334 298 287 324    Basic  Metabolic Panel: Recent Labs  Lab 04/18/20 2231 04/19/20 0653 04/20/20 0326 04/21/20 0612  NA 136 134* 139 141  K 4.0 4.0 4.3 4.2  CL 99 99 104 106  CO2 26 22 24 26   GLUCOSE 135* 143* 135* 100*  BUN 14 11 13 17   CREATININE 0.95 0.83 0.78 0.83  CALCIUM 9.5 9.3 9.4 9.6  MG  --   --  1.9 1.8  PHOS  --   --  4.2 4.6    GFR: Estimated Creatinine Clearance: 62.3 mL/min (by C-G formula based on SCr of 0.83 mg/dL).  Liver Function Tests: Recent Labs  Lab 04/19/20 0653 04/20/20 0326 04/21/20 0612  AST 49* 29 27  ALT 42 33 32  ALKPHOS 118 100 91  BILITOT 0.5 0.3 0.3  PROT 6.3* 6.0* 5.8*  ALBUMIN 2.5* 2.2* 2.2*    CBG: No results for input(s): GLUCAP in the last 168 hours.   Recent Results (from the past 240 hour(s))  SARS Coronavirus 2 by RT PCR (hospital order, performed in [facility] hospital lab) Nasopharyngeal Nasopharyngeal Swab     Status: None   Collection Time: 04/19/20  3:18 AM   Specimen: Nasopharyngeal Swab  Result Value Ref Range Status   SARS Coronavirus 2 NEGATIVE NEGATIVE Final    Comment: (NOTE) SARS-CoV-2 target nucleic acids are NOT DETECTED.  The SARS-CoV-2 RNA is generally detectable in upper and lower respiratory specimens during the acute phase of infection. The lowest concentration of SARS-CoV-2 viral copies this assay can detect is 250 copies / mL. Katelee Schupp negative  result does not preclude SARS-CoV-2 infection and should not be used as the sole basis for treatment or other patient management decisions.  Jedrek Dinovo negative result may occur with improper specimen collection / handling, submission of specimen other than nasopharyngeal swab, presence of viral mutation(s) within the areas targeted by this assay, and inadequate number of viral copies (<250 copies / mL). Cleta Heatley negative result must be combined with clinical observations, patient history, and epidemiological information.  Fact Sheet for Patients:   [facility]  Fact Sheet for Healthcare Providers: 06/20/20  This test is not yet approved or  cleared by the [employer] FDA and has been authorized for detection and/or diagnosis of SARS-CoV-2 by FDA under an Emergency Use Authorization (EUA).  This EUA will remain in effect (meaning this test can be used) for the duration of the COVID-19 declaration under Section 564(b)(1) of the Act, 21 U.S.C. section 360bbb-3(b)(1), unless the authorization is terminated or revoked sooner.  Performed at [facility], [address], [place], [address] [place]   Blood Culture (routine x 2)     Status: None (Preliminary result)   Collection Time: 04/19/20  7:13 AM   Specimen: BLOOD  Result Value Ref Range Status   Specimen Description BLOOD LEFT ANTECUBITAL  Final   Special Requests   Final    BOTTLES DRAWN AEROBIC AND ANAEROBIC Blood Culture adequate volume   Culture   Final    NO GROWTH 1 DAY Performed at [facility], [address] [place], [address] [place]    Report Status PENDING  Incomplete  Respiratory Panel by PCR     Status: None   Collection Time: 04/19/20 10:35 AM   Specimen: Nasopharyngeal Swab; Respiratory  Result Value Ref Range Status   Adenovirus NOT DETECTED NOT DETECTED Final   Coronavirus 229E NOT DETECTED NOT DETECTED Final    Comment: (NOTE) The Coronavirus  on the Respiratory Panel, DOES NOT  test for the novel  Coronavirus (2019 nCoV)    Coronavirus HKU1 NOT DETECTED NOT DETECTED Final   Coronavirus NL63 NOT DETECTED NOT DETECTED Final   Coronavirus OC43 NOT DETECTED NOT DETECTED Final   Metapneumovirus NOT DETECTED NOT DETECTED Final   Rhinovirus / Enterovirus NOT DETECTED NOT DETECTED Final   Influenza Beyounce Dickens NOT DETECTED NOT DETECTED Final   Influenza B NOT DETECTED NOT DETECTED Final   Parainfluenza Virus 1 NOT DETECTED NOT DETECTED Final   Parainfluenza Virus 2 NOT DETECTED NOT DETECTED Final   Parainfluenza Virus 3 NOT DETECTED NOT DETECTED Final   Parainfluenza Virus 4 NOT DETECTED NOT DETECTED Final   Respiratory Syncytial Virus NOT DETECTED NOT DETECTED Final   Bordetella pertussis NOT DETECTED NOT DETECTED Final   Chlamydophila pneumoniae NOT DETECTED NOT DETECTED Final   Mycoplasma pneumoniae NOT DETECTED NOT DETECTED Final    Comment: Performed at [facility], [address], [place], [place] [address]  SARS Coronavirus 2 by RT PCR (hospital order, performed in [facility] hospital lab) Nasopharyngeal Nasopharyngeal Swab     Status: None   Collection Time: 04/19/20 10:35 AM   Specimen: Nasopharyngeal Swab  Result Value Ref Range Status   SARS Coronavirus 2 NEGATIVE NEGATIVE Final    Comment: (NOTE) SARS-CoV-2 target nucleic acids are NOT DETECTED.  The SARS-CoV-2 RNA is generally detectable in upper and lower respiratory specimens during the acute phase of infection. The lowest concentration of SARS-CoV-2 viral copies this assay can detect is 250 copies / mL. Javar Eshbach negative result does not preclude SARS-CoV-2 infection and should not be used as the sole basis for treatment or other patient management decisions.  Jordane Hisle negative result may occur with improper specimen collection / handling, submission of specimen other than nasopharyngeal swab, presence of viral mutation(s) within the areas targeted by this assay, and inadequate  number of viral copies (<250 copies / mL). Dafna Romo negative result must be combined with clinical observations, patient history, and epidemiological information.  Fact Sheet for Patients:   [employer]  Fact Sheet for Healthcare Providers: https://pope.com/  This test is not yet approved or  cleared by the [place] FDA and has been authorized for detection and/or diagnosis of SARS-CoV-2 by FDA under an Emergency Use Authorization (EUA).  This EUA will remain in effect (meaning this test can be used) for the duration of the COVID-19 declaration under Section 564(b)(1) of the Act, 21 U.S.C. section 360bbb-3(b)(1), unless the authorization is terminated or revoked sooner.  Performed at [facility], [address] [place], [place] [address]          Radiology Studies: VAS [place] LOWER EXTREMITY VENOUS (DVT)  Result Date: 04/20/2020  Lower Venous DVT Study Indications: Elevated D-dimer, and SOB.  Comparison Study: No prior study on file Performing Technologist: Sherren Kerns RVS  Examination Guidelines: Teigan Manner complete evaluation includes B-mode imaging, spectral Doppler, color Doppler, and power Doppler as needed of all accessible portions of each vessel. Bilateral testing is considered an integral part of Augustina Braddock complete examination. Limited examinations for reoccurring indications may be performed as noted. The reflux portion of the exam is performed with the patient in reverse Trendelenburg.  +---------+---------------+---------+-----------+----------+--------------+ RIGHT    CompressibilityPhasicitySpontaneityPropertiesThrombus Aging +---------+---------------+---------+-----------+----------+--------------+ CFV      Full           Yes      Yes                                 +---------+---------------+---------+-----------+----------+--------------+  SFJ      Full                                                         +---------+---------------+---------+-----------+----------+--------------+ FV Prox  Full                                                        +---------+---------------+---------+-----------+----------+--------------+ FV Mid   Full                                                        +---------+---------------+---------+-----------+----------+--------------+ FV DistalFull                                                        +---------+---------------+---------+-----------+----------+--------------+ PFV      Full                                                        +---------+---------------+---------+-----------+----------+--------------+ POP      Full           Yes      Yes                                 +---------+---------------+---------+-----------+----------+--------------+ PTV      Full                                                        +---------+---------------+---------+-----------+----------+--------------+ PERO     Full                                                        +---------+---------------+---------+-----------+----------+--------------+   +---------+---------------+---------+-----------+----------+--------------+ LEFT     CompressibilityPhasicitySpontaneityPropertiesThrombus Aging +---------+---------------+---------+-----------+----------+--------------+ CFV      Full           Yes      Yes                                 +---------+---------------+---------+-----------+----------+--------------+ SFJ      Full                                                        +---------+---------------+---------+-----------+----------+--------------+  FV Prox  Full                                                        +---------+---------------+---------+-----------+----------+--------------+ FV Mid   Full                                                         +---------+---------------+---------+-----------+----------+--------------+ FV DistalFull                                                        +---------+---------------+---------+-----------+----------+--------------+ PFV      Full                                                        +---------+---------------+---------+-----------+----------+--------------+ POP      Full           Yes      Yes                                 +---------+---------------+---------+-----------+----------+--------------+ PTV      Full                                                        +---------+---------------+---------+-----------+----------+--------------+ PERO     Full                                                        +---------+---------------+---------+-----------+----------+--------------+     Summary: BILATERAL: - No evidence of deep vein thrombosis seen in the lower extremities, bilaterally. -   *See table(s) above for measurements and observations. Electronically signed by Sherald Hess MD on 04/20/2020 at 1:19:20 PM.    Final         Scheduled Meds: . azithromycin  500 mg Oral Q breakfast  . dextromethorphan-guaiFENesin  1 tablet Oral BID  . enoxaparin (LOVENOX) injection  40 mg Subcutaneous Q24H  . simvastatin  20 mg Oral Daily   Continuous Infusions: . cefTRIAXone (ROCEPHIN)  IV 2 g (04/21/20 0609)  . diltiazem (CARDIZEM) infusion       LOS: 2 days    Time spent: over 30 min    Lacretia Nicks, MD [facility]   To contact the attending provider between 7A-7P or the covering provider during after hours 7P-7A, please log into the web site [email or web address] and access using universal [place] password for that web site. If you do not have the password, please call  the hospital operator.  04/21/2020, 9:12 AM

## 2020-04-22 DIAGNOSIS — I480 Paroxysmal atrial fibrillation: Secondary | ICD-10-CM

## 2020-04-22 LAB — COMPREHENSIVE METABOLIC PANEL
ALT: 28 U/L (ref 0–44)
AST: 25 U/L (ref 15–41)
Albumin: 2.2 g/dL — ABNORMAL LOW (ref 3.5–5.0)
Alkaline Phosphatase: 86 U/L (ref 38–126)
Anion gap: 11 (ref 5–15)
BUN: 11 mg/dL (ref 8–23)
CO2: 25 mmol/L (ref 22–32)
Calcium: 8.9 mg/dL (ref 8.9–10.3)
Chloride: 98 mmol/L (ref 98–111)
Creatinine, Ser: 0.68 mg/dL (ref 0.44–1.00)
GFR calc Af Amer: >60 mL/min (ref >60)
GFR calc non Af Amer: >60 mL/min (ref >60)
Glucose, Bld: 104 mg/dL — ABNORMAL HIGH (ref 70–99)
Potassium: 3.9 mmol/L (ref 3.5–5.1)
Sodium: 134 mmol/L — ABNORMAL LOW (ref 135–145)
Total Bilirubin: 0.4 mg/dL (ref 0.3–1.2)
Total Protein: 5.7 g/dL — ABNORMAL LOW (ref 6.5–8.1)

## 2020-04-22 LAB — CBC
HCT: 30.8 % — ABNORMAL LOW (ref 36.0–46.0)
Hemoglobin: 10 g/dL — ABNORMAL LOW (ref 12.0–15.0)
MCH: 31.7 pg (ref 26.0–34.0)
MCHC: 32.5 g/dL (ref 30.0–36.0)
MCV: 97.8 fL (ref 80.0–100.0)
Platelets: 312 10*3/uL (ref 150–400)
RBC: 3.15 MIL/uL — ABNORMAL LOW (ref 3.87–5.11)
RDW: 14 % (ref 11.5–15.5)
WBC: 10.7 10*3/uL — ABNORMAL HIGH (ref 4.0–10.5)
nRBC: 0 % (ref 0.0–0.2)

## 2020-04-22 LAB — HEPARIN LEVEL (UNFRACTIONATED): Heparin Unfractionated: 0.3 IU/mL (ref 0.30–0.70)

## 2020-04-22 LAB — MAGNESIUM: Magnesium: 1.6 mg/dL — ABNORMAL LOW (ref 1.7–2.4)

## 2020-04-22 LAB — PHOSPHORUS: Phosphorus: 3.3 mg/dL (ref 2.5–4.6)

## 2020-04-22 MED ORDER — ATORVASTATIN CALCIUM 10 MG PO TABS
10.0000 mg | ORAL_TABLET | Freq: Every day | ORAL | Status: DC
  Administered 2020-04-23: 10 mg via ORAL
  Filled 2020-04-22: qty 1

## 2020-04-22 MED ORDER — APIXABAN 5 MG PO TABS
5.0000 mg | ORAL_TABLET | Freq: Two times a day (BID) | ORAL | Status: DC
  Administered 2020-04-22 – 2020-04-23 (×2): 5 mg via ORAL
  Filled 2020-04-22 (×2): qty 1

## 2020-04-22 MED ORDER — MAGNESIUM SULFATE 2 GM/50ML IV SOLN
2.0000 g | Freq: Once | INTRAVENOUS | Status: AC
  Administered 2020-04-22: 2 g via INTRAVENOUS
  Filled 2020-04-22: qty 50

## 2020-04-22 MED ORDER — PREDNISONE 20 MG PO TABS: 40.0000 mg | ORAL_TABLET | Freq: Every day | ORAL | Status: DC

## 2020-04-22 MED ORDER — DILTIAZEM HCL 30 MG PO TABS
30.0000 mg | ORAL_TABLET | Freq: Four times a day (QID) | ORAL | Status: DC
  Administered 2020-04-22 – 2020-04-23 (×4): 30 mg via ORAL
  Filled 2020-04-22 (×4): qty 1

## 2020-04-22 MED ORDER — PREDNISONE 20 MG PO TABS
40.0000 mg | ORAL_TABLET | Freq: Every day | ORAL | Status: DC
  Administered 2020-04-22 – 2020-04-23 (×2): 40 mg via ORAL
  Filled 2020-04-22 (×2): qty 2

## 2020-04-22 NOTE — Progress Notes (Signed)
   04/22/20 2316  Oxygen Therapy  SpO2 (!) 87 %    placed patient on 2L [place]. SpO2 increased to 96%.

## 2020-04-22 NOTE — Consult Note (Signed)
Cardiology Consultation:   Patient ID: [name] MRN: [id number]; DOB: [birth year]  Admit date: 04/18/2020 Date of Consult: 04/22/2020  Primary Care Provider: Burton Apley, MD [facility] Cardiologist: [facility] Electrophysiologist:  None   Patient Profile:   Heather Koch is a 69 y.o. female with a hx of RA who is being seen today for the evaluation of paroxysmal atrial fibrillation/flutter  at the request of Dr Lowell Guitar.  History of Present Illness:   Heather Koch is a very pleasant 69 yo WF who was admitted with fever, respiratory distress and atypical multilobar PNA that failed to improve with outpatient antibiotics. She was admitted and started on IV antibiotics. Yesterday AM she developed Afib/flutter with RVR lasting approximately 3 hours. She felt anxious with this but otherwise denied increased SOB, chest pain, or palpitations. She was started on IV heparin and oral diltiazem. One brief episode of AFib this am lasting seconds. Echo was OK. TSH normal. She feels her breathing and cough have improved. She does note off and on palpitations for some time. Typically 3-4 times a month self limiting. No syncope.    Past Medical History:  Diagnosis Date  . ASCUS (atypical squamous cells of undetermined significance) on Pap smear 12/2008   neg HR HPV  . LGSIL (low grade squamous intraepithelial dysplasia) 2005/2006  . Lichen sclerosus 2012  . Rheumatoid arthritis(714.0) 2012    Past Surgical History:  Procedure Laterality Date  . TUBAL LIGATION  1997     Home Medications:  Prior to Admission medications   Medication Sig Start Date End Date Taking? Authorizing Provider  ALPRAZolam Prudy Feeler) 1 MG tablet Take 0.25-1 mg by mouth at bedtime as needed for anxiety or sleep.    Yes [provider]  amoxicillin-clavulanate (AUGMENTIN) 875-125 MG tablet Take 1 tablet by mouth 2 (two) times daily. 04/16/20  Yes [provider]  clobetasol cream (TEMOVATE) 0.05 %  APPLY TOPICALLY AT BEDTIME AS NEEDED FOR ITCHING Patient taking differently: Apply 1 application topically at bedtime as needed (Itching).  02/21/19  Yes Harrington Challenger, NP  diclofenac sodium (VOLTAREN) 1 % GEL Apply 3 gm to 3 large joints up to 3 times a day.Dispense 3 tubes with 3 refills. Patient taking differently: Apply 3 g topically 3 (three) times daily as needed (Joint pain).  01/19/18  Yes Deveshwar, Janalyn Rouse, MD  folic acid (FOLVITE) 1 MG tablet Take 2 tablets (2 mg total) by mouth daily. 10/04/19  Yes Deveshwar, Janalyn Rouse, MD  Methotrexate Sodium (METHOTREXATE, PF,) 250 MG/10ML injection INJECT 0.8ML INTO THE SKIN ONCE WEEKLY Patient taking differently: Inject 20 mg into the muscle once a week. Inject 0.12ml once a week. 02/26/20  Yes Deveshwar, Janalyn Rouse, MD  Multiple Vitamin (MULTIVITAMIN PO) Take 1 tablet by mouth daily.    Yes [provider]  simvastatin (ZOCOR) 20 MG tablet Take 1 tablet by mouth daily. 10/21/17  Yes [provider]  VITAMIN D PO Take 1 tablet by mouth daily.    Yes [provider]  TUBERCULIN SYR 1CC/27GX1/2" (B-D TB SYRINGE 1CC/27GX1/2") 27G X 1/2" 1 ML MISC use to inject methotrexate weekly 10/04/19   Pollyann Savoy, MD    Inpatient Medications: Scheduled Meds: . [START ON 04/23/2020] atorvastatin  10 mg Oral Daily  . azithromycin  500 mg Oral Q breakfast  . dextromethorphan-guaiFENesin  1 tablet Oral BID  . diltiazem  30 mg Oral Q6H  . predniSONE  40 mg Oral Q breakfast   Continuous Infusions: . cefTRIAXone (  ROCEPHIN)  IV 2 g (04/22/20 0537)   PRN Meds: acetaminophen **OR** acetaminophen, ALPRAZolam, chlorpheniramine-HYDROcodone, levalbuterol, ondansetron **OR** ondansetron (ZOFRAN) IV  Allergies:    Allergies  Allergen Reactions  . Fish Allergy Other (See Comments)    Scallops  Reaction: Syncope    Social History:   Social History   Socioeconomic History  . Marital status: Married    Spouse name: Not on file  . Number of  children: Not on file  . Years of education: Not on file  . Highest education level: Not on file  Occupational History  . Not on file  Tobacco Use  . Smoking status: Never Smoker  . Smokeless tobacco: Never Used  Vaping Use  . Vaping Use: Never used  Substance and Sexual Activity  . Alcohol use: No  . Drug use: No  . Sexual activity: Yes    Partners: Male    Birth control/protection: Post-menopausal  Other Topics Concern  . Not on file  Social History Narrative  . Not on file   Social Determinants of Health   Financial Resource Strain:   . Difficulty of Paying Living Expenses:   Food Insecurity:   . Worried About Programme researcher, broadcasting/film/video in the Last Year:   . Barista in the Last Year:   Transportation Needs:   . Freight forwarder (Medical):   Marland Kitchen Lack of Transportation (Non-Medical):   Physical Activity:   . Days of Exercise per Week:   . Minutes of Exercise per Session:   Stress:   . Feeling of Stress :   Social Connections:   . Frequency of Communication with Friends and Family:   . Frequency of Social Gatherings with Friends and Family:   . Attends Religious Services:   . Active Member of Clubs or Organizations:   . Attends Banker Meetings:   Marland Kitchen Marital Status:   Intimate Partner Violence:   . Fear of Current or Ex-Partner:   . Emotionally Abused:   Marland Kitchen Physically Abused:   . Sexually Abused:     Family History:    Family History  Problem Relation Age of Onset  . Hypertension Mother   . Diabetes Mother   . Cancer Father        liver  . Heart disease Sister   . Cancer Brother        pancreatic cancer  . Cancer Maternal Aunt        throat  . Heart disease Sister   . Heart disease Sister   . Cancer Maternal Uncle        Colon  . Heart disease Brother        pacemaker  . Healthy Son   . Healthy Son   . Healthy Daughter      ROS:  Please see the history of present illness.  All other ROS reviewed and negative.     Physical  Exam/Data:   Vitals:   04/21/20 1604 04/21/20 2204 04/22/20 0824 04/22/20 1646  BP: (!) 92/48 (!) 106/55 102/62 126/66  Pulse: 72 77 79 79  Resp:  18    Temp: 99.4 F (37.4 C) 98.2 F (36.8 C) 98.2 F (36.8 C) 97.8 F (36.6 C)  TempSrc:   Oral Oral  SpO2: 98% 95% 97% 99%  Weight:      Height:        Intake/Output Summary (Last 24 hours) at 04/22/2020 1656 Last data filed at 04/22/2020 0537 Gross per 24 hour  Intake 388.35 ml  Output --  Net 388.35 ml   Last 3 Weights 04/19/2020 03/03/2020 11/28/2019  Weight (lbs) 150 lb 155 lb 153 lb  Weight (kg) 68.04 kg 70.308 kg 69.4 kg     Body mass index is 25.75 kg/m.  General:  Well nourished, well developed, in no acute distress HEENT: normal Lymph: no adenopathy Neck: no JVD Endocrine:  No thryomegaly Vascular: No carotid bruits; FA pulses 2+ bilaterally without bruits  Cardiac:  normal S1, S2; RRR; no murmur  Lungs:  Bilateral dry crackles Abd: soft, nontender, no hepatomegaly  Ext: no edema Musculoskeletal:  No deformities, BUE and BLE strength normal and equal Skin: warm and dry  Neuro:  CNs 2-12 intact, no focal abnormalities noted Psych:  Normal affect   EKG:  The EKG was personally reviewed and demonstrates:  NSR with PVCs. Otherwise normal.  Telemetry:  Telemetry was personally reviewed and demonstrates:  As noted in HPI. Currently NSR  Relevant CV Studies: Echo 04/21/20: IMPRESSIONS    1. Left ventricular ejection fraction, by estimation, is 50 to 55%. The  left ventricle has low normal function. The left ventricle demonstrates  global hypokinesis. Left ventricular diastolic parameters are  indeterminate.  2. Right ventricular systolic function is normal. The right ventricular  size is normal. Tricuspid regurgitation signal is inadequate for assessing  PA pressure.  3. The mitral valve is grossly normal. Trivial mitral valve  regurgitation.  4. The aortic valve is tricuspid. Aortic valve regurgitation is not   visualized.  5. The inferior vena cava is normal in size with greater than 50%  respiratory variability, suggesting right atrial pressure of 3 mmHg.   Laboratory Data:  High Sensitivity Troponin:   Recent Labs  Lab 04/18/20 2231 04/19/20 0049 04/21/20 1054 04/21/20 1237  TROPONINIHS Chemistry Recent Labs  Lab 04/20/20 0326 04/21/20 0612 04/22/20 0333  NA 139 141 134*  K 4.3 4.2 3.9  CL 104 106 98  CO2 GLUCOSE 135* 100* 104*  BUN CREATININE 0.78 0.83 0.68  CALCIUM 9.4 9.6 8.9  GFRNONAA >60 >60 >60  GFRAA >60 >60 >60  ANIONGAP Recent Labs  Lab 04/20/20 0326 04/21/20 0612 04/22/20 0333  PROT 6.0* 5.8* 5.7*  ALBUMIN 2.2* 2.2* 2.2*  AST ALT 33 32 28  ALKPHOS 100 91 86  BILITOT 0.3 0.3 0.4   Hematology Recent Labs  Lab 04/20/20 0326 04/21/20 0612 04/22/20 0333  WBC 12.2* 10.8* 10.7*  RBC 3.21* 3.21* 3.15*  HGB 10.3* 10.1* 10.0*  HCT 31.9* 32.1* 30.8*  MCV 99.4 100.0 97.8  MCH 32.1 31.5 31.7  MCHC 32.3 31.5 32.5  RDW 13.5 13.7 14.0  PLT 287 324 312   BNP Recent Labs  Lab 04/21/20 0612  BNP 148.3*    DDimer  Recent Labs  Lab 04/19/20 0653  DDIMER 1.98*     Radiology/Studies:  DG Chest 2 View  Result Date: 04/18/2020 CLINICAL DATA:  Shortness of breath EXAM: CHEST - 2 VIEW COMPARISON:  04/16/2020 FINDINGS: No consolidation or pleural effusion. Mild diffuse increased interstitial and ground-glass opacity. Stable cardiomediastinal silhouette. No pneumothorax. IMPRESSION: Mild diffuse increased interstitial and ground-glass opacity compared to prior suggesting interstitial inflammatory process/possible atypical infection versus less likely edema Electronically Signed   By: Jasmine Pang M.D.   On: 04/18/2020 23:00   CT ANGIO CHEST PE W OR WO  CONTRAST  Result Date: 04/19/2020 CLINICAL DATA:  Shortness of breath for 1 week EXAM: CT ANGIOGRAPHY CHEST WITH CONTRAST TECHNIQUE: Multidetector CT  imaging of the chest was performed using the standard protocol during bolus administration of intravenous contrast. Multiplanar CT image reconstructions and MIPs were obtained to evaluate the vascular anatomy. CONTRAST:  59mL OMNIPAQUE IOHEXOL 350 MG/ML SOLN COMPARISON:  Chest x-ray April 18, 2020 FINDINGS: Cardiovascular: Satisfactory opacification of the pulmonary arteries to the segmental level. No evidence of pulmonary embolism. Normal heart size. No pericardial effusion. Mediastinum/Nodes: No enlarged mediastinal, hilar, or axillary lymph nodes. Thyroid gland, trachea, and esophagus demonstrate no significant findings. Lungs/Pleura: Patchy consolidation is identified throughout all lobes of bilateral lungs. Minimal bilateral pleural effusions are noted. Upper Abdomen: No acute abnormality. Musculoskeletal: No acute abnormality is identified. Degenerative joint changes of the spine are noted. 1.3 cm nodule is identified in the central posterior left breast series 5, image 48. Review of the MIP images confirms the above findings. IMPRESSION: 1. No pulmonary embolus. 2. Patchy consolidation is identified throughout all lobes of bilateral lungs. This is consistent with multifocal pneumonia. 3. Minimal bilateral pleural effusions. 4. 1.3 cm nodule is identified in the central posterior left breast. Recommend further evaluation with diagnostic mammogram and ultrasound. Electronically Signed   By: Sherian Rein M.D.   On: 04/19/2020 09:32   DG CHEST PORT 1 VIEW  Result Date: 04/21/2020 CLINICAL DATA:  Tachycardia and dizziness EXAM: PORTABLE CHEST 1 VIEW COMPARISON:  Chest radiograph dated 04/18/2020 FINDINGS: The heart size and mediastinal contours are within normal limits. Moderate bilateral peripheral and lower lung predominant interstitial and airspace opacities appear similar to prior exam. There is no pleural effusion or pneumothorax. The visualized skeletal structures are unremarkable. IMPRESSION: Moderate  bilateral peripheral and lower lung predominant interstitial and airspace opacities appear similar to prior exam, may represent pulmonary edema or atypical infection. Electronically Signed   By: Romona Curls M.D.   On: 04/21/2020 13:20   ECHOCARDIOGRAM COMPLETE  Result Date: 04/21/2020    ECHOCARDIOGRAM REPORT   Patient Name:   KYRSTAN GOTWALT Date of Exam: 04/21/2020 Medical Rec #:  [id number]    Height:       64.0 in Accession #:    [id number]   Weight:       150.0 lb Date of Birth:  29-Jul-1951     BSA:          1.731 m Patient Age:    68 years     BP:           123/92 mmHg Patient Gender: F            HR:           57 bpm. Exam Location:  Inpatient Procedure: 2D Echo Indications:    Atrial fibrillation (HCC)  History:        Patient has no prior history of Echocardiogram examinations.                 Signs/Symptoms:Fever. SVT  Afib/Flutter, Acute respiratory                 failure with Hypoxia,.  Sonographer:    Leta Jungling RDCS Referring Phys: [phone] A CALDWELL POWELL JR IMPRESSIONS  1. Left ventricular ejection fraction, by estimation, is 50 to 55%. The left ventricle has low normal function. The left ventricle demonstrates global hypokinesis. Left ventricular diastolic parameters are indeterminate.  2. Right ventricular systolic function is normal. The right ventricular size  is normal. Tricuspid regurgitation signal is inadequate for assessing PA pressure.  3. The mitral valve is grossly normal. Trivial mitral valve regurgitation.  4. The aortic valve is tricuspid. Aortic valve regurgitation is not visualized.  5. The inferior vena cava is normal in size with greater than 50% respiratory variability, suggesting right atrial pressure of 3 mmHg. FINDINGS  Left Ventricle: Left ventricular ejection fraction, by estimation, is 50 to 55%. The left ventricle has low normal function. The left ventricle demonstrates global hypokinesis. The left ventricular internal cavity size was normal in size. There is no left  ventricular hypertrophy. Left ventricular diastolic parameters are indeterminate. Right Ventricle: The right ventricular size is normal. No increase in right ventricular wall thickness. Right ventricular systolic function is normal. Tricuspid regurgitation signal is inadequate for assessing PA pressure. Left Atrium: Left atrial size was normal in size. Right Atrium: Right atrial size was normal in size. Pericardium: There is no evidence of pericardial effusion. Mitral Valve: The mitral valve is grossly normal. Trivial mitral valve regurgitation. Tricuspid Valve: The tricuspid valve is grossly normal. Tricuspid valve regurgitation is trivial. Aortic Valve: The aortic valve is tricuspid. Aortic valve regurgitation is not visualized. Mild aortic valve annular calcification. Pulmonic Valve: The pulmonic valve was not well visualized. Pulmonic valve regurgitation is trivial. Aorta: The aortic root is normal in size and structure. Venous: The inferior vena cava is normal in size with greater than 50% respiratory variability, suggesting right atrial pressure of 3 mmHg. IAS/Shunts: No atrial level shunt detected by color flow Doppler.  LEFT VENTRICLE PLAX 2D LVIDd:         4.20 cm  Diastology LVIDs:         3.10 cm  LV e' lateral:   8.27 cm/s LV PW:         0.70 cm  LV E/e' lateral: 8.8 LV IVS:        0.80 cm  LV e' medial:    6.31 cm/s LVOT diam:     1.70 cm  LV E/e' medial:  11.5 LV SV:         43 LV SV Index:   25 LVOT Area:     2.27 cm  RIGHT VENTRICLE RV S prime:     11.90 cm/s TAPSE (M-mode): 1.7 cm LEFT ATRIUM             Index LA diam:        3.10 cm 1.79 cm/m LA Vol (A2C):   40.7 ml 23.51 ml/m LA Vol (A4C):   43.8 ml 25.33 ml/m LA Biplane Vol: 41.6 ml 24.03 ml/m  AORTIC VALVE LVOT Vmax:   92.50 cm/s LVOT Vmean:  66.600 cm/s LVOT VTI:    0.189 m  AORTA Ao Root diam: 2.30 cm MITRAL VALVE MV Area (PHT): 3.31 cm    SHUNTS MV Decel Time: 229 msec    Systemic VTI:  0.19 m MV E velocity: 72.70 cm/s  Systemic Diam:  1.70 cm MV A velocity: 68.60 cm/s MV E/A ratio:  1.06 Nona Dell MD Electronically signed by Nona Dell MD Signature Date/Time: 04/21/2020/4:07:36 PM    Final    VAS [place] LOWER EXTREMITY VENOUS (DVT)  Result Date: 04/20/2020  Lower Venous DVT Study Indications: Elevated D-dimer, and SOB.  Comparison Study: No prior study on file Performing Technologist: Sherren Kerns RVS  Examination Guidelines: A complete evaluation includes B-mode imaging, spectral Doppler, color Doppler, and power Doppler as needed of all accessible portions of each vessel. Bilateral testing is  considered an integral part of a complete examination. Limited examinations for reoccurring indications may be performed as noted. The reflux portion of the exam is performed with the patient in reverse Trendelenburg.  +---------+---------------+---------+-----------+----------+--------------+ RIGHT    CompressibilityPhasicitySpontaneityPropertiesThrombus Aging +---------+---------------+---------+-----------+----------+--------------+ CFV      Full           Yes      Yes                                 +---------+---------------+---------+-----------+----------+--------------+ SFJ      Full                                                        +---------+---------------+---------+-----------+----------+--------------+ FV Prox  Full                                                        +---------+---------------+---------+-----------+----------+--------------+ FV Mid   Full                                                        +---------+---------------+---------+-----------+----------+--------------+ FV DistalFull                                                        +---------+---------------+---------+-----------+----------+--------------+ PFV      Full                                                        +---------+---------------+---------+-----------+----------+--------------+ POP       Full           Yes      Yes                                 +---------+---------------+---------+-----------+----------+--------------+ PTV      Full                                                        +---------+---------------+---------+-----------+----------+--------------+ PERO     Full                                                        +---------+---------------+---------+-----------+----------+--------------+   +---------+---------------+---------+-----------+----------+--------------+ LEFT     CompressibilityPhasicitySpontaneityPropertiesThrombus Aging +---------+---------------+---------+-----------+----------+--------------+ CFV  Full           Yes      Yes                                 +---------+---------------+---------+-----------+----------+--------------+ SFJ      Full                                                        +---------+---------------+---------+-----------+----------+--------------+ FV Prox  Full                                                        +---------+---------------+---------+-----------+----------+--------------+ FV Mid   Full                                                        +---------+---------------+---------+-----------+----------+--------------+ FV DistalFull                                                        +---------+---------------+---------+-----------+----------+--------------+ PFV      Full                                                        +---------+---------------+---------+-----------+----------+--------------+ POP      Full           Yes      Yes                                 +---------+---------------+---------+-----------+----------+--------------+ PTV      Full                                                        +---------+---------------+---------+-----------+----------+--------------+ PERO     Full                                                         +---------+---------------+---------+-----------+----------+--------------+     Summary: BILATERAL: - No evidence of deep vein thrombosis seen in the lower extremities, bilaterally. -   *See table(s) above for measurements and observations. Electronically signed by Sherald Hess MD on 04/20/2020 at 1:19:20 PM.    Final    {  Assessment and Plan:   1. Paroxysmal atrial fibrillation/flutter. Noted in setting  of multilobar PNA. Self terminating after 3 hours. Patient does have some history of palpitations PTA so may have had in the past but I suspect more likely triggered by PNA. She has a [place] vasc score of 2 based on age and sex. For now recommend rate control with diltiazem. In am can switch to sustained release formulation. I would also recommend anticoagulation for now with Eliquis. Avoid ASA and NSAIDs. After PNA resolved and patient is back in steady state we may want to consider an outpatient event monitor to see if she is having other episodes of afib/flutter. If not then we could consider stopping anticoagulation later.  2. Multilobar PNA atypical. Per primary care team 3. RA.        For questions or updates, please contact [facility] Please consult [email or web address] for contact info under    Signed, Laytoya Ion [place], MD  04/22/2020 4:56 PM

## 2020-04-22 NOTE — Progress Notes (Addendum)
PROGRESS NOTE    Heather Koch  ION:[id number] DOB: 01-Jul-1951 DOA: 04/18/2020 PCP: Burton Apley, MD   Chief Complaint  Patient presents with  . Shortness of Breath    Brief Narrative:  Heather Koch is Heather Koch 69 y.o. female with medical history significant for rheumatoid arthritis who presents to the ED due to about 2 weeks onset of worsening generalized malaise, body aches, fever (up to 101-103F), increasing and worsening shortness of breath, nonproductive cough.  She was treated with antibiotics for about 10 days prior to admission (6 days of keflex, then transitioned to augmentin).  She was vaccinated for covid in January.  She had imaging findings concerning for atypical pneumonia and she was admitted for IV antibiotics after failing outpatient therapy.  Hospitalization complicated by afib/flutter, now back in sinus rhythm.  Discharge pending additional improvement in respiratory status.  Consider pulm c/s if not improving given prolonged course.    Assessment & Plan:   Principal Problem:   Acute respiratory failure with hypoxia (HCC) Active Problems:   Rheumatoid arthritis involving multiple sites with positive rheumatoid factor (HCC)   Atypical pneumonia  SVT  Paroxysmal Afib/Flutter: fib/flutter 7/5 AM.  No prior hx, but does note hx of palpitations for years.   Has converted on dilt gtt, now in sinus Echo low normal EF, global hypokinesis (see report), TSH wnl Chadsvasc is 2 for age/female -> discussed anticoagulation risk/benefits (pt agreeable, husband has since had some questions) - start heparin gtt Had extended conversation with family today regarding new diagnosis and meds and need for follow up with cardiology.  Husband very anxious about new medications, anticoagulation.  Had extensive discussions about recommendations (30+ min at bedside), follow up plan, but I think given their overall concern, will ask if cardiology can see them here.    Acute respiratory failure with  Hypoxia  Community Acquired Pneumonia  Multifocal Pneumonia  2 weeks sx, progressive fever, SOB, cough - she's been on abx for about 10 days prior to presentation (keflex -> augmentin)  CXR with mild diffuse interstitial and ggo suggestion interstitial inflammatory process - atypical infection vs less likely edema CT PE protocol with patchy consolidation throughout all lobes of bilateral lungs c/w multifocal pneumonia, minimal bilateral pleural effusions Continue ceftriaxone (day 5 today), azithromycin (day 4) Wheezing noted on exam today, continue xopenex, will start short course of steroids (received dose of dex x1 in the ED) Consider pulm c/s if not improving given extended course  CXR 7/5 with bilateral peripheral and lower lung predominant interstitial and airspace opacities (pulm edema vs atypical infection) Follow sputum cx (pending collection), urine strep (negative), urine legionella (negative), MRSA pcr (pending collection). Follow RVP (negative).  COVID negative x2 (she's been vaccinated). Xopenex prn IS, flutter, OOB, respiratory c/s   Rheumatoid arthritis Stable, continue Tylenol 650 MG p.o. every 6 hours as needed for fever  1.3 cm Nodule in Central Posterior L Breast: she needs further eval with mammogram and [place] as outpatient   Elevated D dimer; negative CT PE, follow LE [place] (negative), no LE swelling - likely 2/2 pneumonia  Stop simvastatin and start lipitor as she's been started on diltiazem  DVT prophylaxis: lovenox Code Status: full  Family Communication: none at bedsdie Disposition:   Status is: Inpatient  Remains inpatient appropriate because:Inpatient level of care appropriate due to severity of illness   Dispo: The patient is from: Home              Anticipated d/c is to:  Home              Anticipated d/c date is: > 3 days              Patient currently is not medically stable to d/c.   Consultants:   none  Procedures:   none  Antimicrobials:   Anti-infectives (From admission, onward)   Start     Dose/Rate Route Frequency Ordered Stop   04/21/20 0700  azithromycin (ZITHROMAX) tablet 500 mg     Discontinue     500 mg Oral Daily with breakfast 04/20/20 0956     04/21/20 0500  cefTRIAXone (ROCEPHIN) 2 g in sodium chloride 0.9 % 100 mL IVPB     Discontinue     2 g 200 mL/hr over 30 Minutes Intravenous Every 24 hours 04/20/20 0954     04/20/20 0600  azithromycin (ZITHROMAX) 500 mg in sodium chloride 0.9 % 250 mL IVPB  Status:  Discontinued        500 mg 250 mL/hr over 60 Minutes Intravenous Every 24 hours 04/19/20 0627 04/20/20 0956   04/20/20 0500  cefTRIAXone (ROCEPHIN) 1 g in sodium chloride 0.9 % 100 mL IVPB  Status:  Discontinued        1 g 200 mL/hr over 30 Minutes Intravenous Every 24 hours 04/19/20 0627 04/20/20 0954   04/19/20 0445  cefTRIAXone (ROCEPHIN) 1 g in sodium chloride 0.9 % 100 mL IVPB        1 g 200 mL/hr over 30 Minutes Intravenous  Once 04/19/20 0432 04/19/20 0534   04/19/20 0445  azithromycin (ZITHROMAX) 500 mg in sodium chloride 0.9 % 250 mL IVPB        500 mg 250 mL/hr over 60 Minutes Intravenous  Once 04/19/20 0432 04/19/20 0756     Subjective: Persistent cough, shortness of breath  Objective: Vitals:   04/21/20 0912 04/21/20 1604 04/21/20 2204 04/22/20 0824  BP:  (!) 92/48 (!) 106/55 102/62  Pulse:  72 77 79  Resp: 16  18   Temp:  99.4 F (37.4 C) 98.2 F (36.8 C) 98.2 F (36.8 C)  TempSrc:    Oral  SpO2:  98% 95% 97%  Weight:      Height:        Intake/Output Summary (Last 24 hours) at 04/22/2020 1049 Last data filed at 04/22/2020 0537 Gross per 24 hour  Intake 388.35 ml  Output --  Net 388.35 ml   Filed Weights   04/19/20 0346  Weight: 68 kg    Examination:  General: No acute distress. Cardiovascular: Heart sounds show Shawnay Bramel regular rate, and rhythm Lungs: scattered end expiratory wheezing, rhonchi  Abdomen: Soft, nontender, nondistended  Neurological: Alert and oriented 3. Moves  all extremities 4. Cranial nerves II through XII grossly intact. Skin: Warm and dry. No rashes or lesions. Extremities: No clubbing or cyanosis. No edema.   Data Reviewed: I have personally reviewed following labs and imaging studies  CBC: Recent Labs  Lab 04/18/20 2231 04/19/20 0653 04/20/20 0326 04/21/20 0612 04/22/20 0333  WBC 11.7* 12.3* 12.2* 10.8* 10.7*  NEUTROABS  --   --   --  7.1  --   HGB 12.1 10.8* 10.3* 10.1* 10.0*  HCT 37.5 33.3* 31.9* 32.1* 30.8*  MCV 99.5 99.1 99.4 100.0 97.8  PLT 334 298 287 324 312    Basic Metabolic Panel: Recent Labs  Lab 04/18/20 2231 04/19/20 0653 04/20/20 0326 04/21/20 0612 04/22/20 0333  NA 136 134* 139 141 134*  K 4.0 4.0 4.3 4.2 3.9  CL 99 99 104 106 98  CO2 26 22 24 26 25   GLUCOSE 135* 143* 135* 100* 104*  BUN 14 11 13 17 11   CREATININE 0.95 0.83 0.78 0.83 0.68  CALCIUM 9.5 9.3 9.4 9.6 8.9  MG  --   --  1.9 1.8 1.6*  PHOS  --   --  4.2 4.6 3.3    GFR: Estimated Creatinine Clearance: 64.6 mL/min (by C-G formula based on SCr of 0.68 mg/dL).  Liver Function Tests: Recent Labs  Lab 04/19/20 0653 04/20/20 0326 04/21/20 0612 04/22/20 0333  AST 49* 29 27 25   ALT 42 33 32 28  ALKPHOS 118 100 91 86  BILITOT 0.5 0.3 0.3 0.4  PROT 6.3* 6.0* 5.8* 5.7*  ALBUMIN 2.5* 2.2* 2.2* 2.2*    CBG: No results for input(s): GLUCAP in the last 168 hours.   Recent Results (from the past 240 hour(s))  SARS Coronavirus 2 by RT PCR (hospital order, performed in [facility] hospital lab) Nasopharyngeal Nasopharyngeal Swab     Status: None   Collection Time: 04/19/20  3:18 AM   Specimen: Nasopharyngeal Swab  Result Value Ref Range Status   SARS Coronavirus 2 NEGATIVE NEGATIVE Final    Comment: (NOTE) SARS-CoV-2 target nucleic acids are NOT DETECTED.  The SARS-CoV-2 RNA is generally detectable in upper and lower respiratory specimens during the acute phase of infection. The lowest concentration of SARS-CoV-2 viral copies this  assay can detect is 250 copies / mL. Kellee Sittner negative result does not preclude SARS-CoV-2 infection and should not be used as the sole basis for treatment or other patient management decisions.  Lenin Kuhnle negative result may occur with improper specimen collection / handling, submission of specimen other than nasopharyngeal swab, presence of viral mutation(s) within the areas targeted by this assay, and inadequate number of viral copies (<250 copies / mL). Carime Dinkel negative result must be combined with clinical observations, patient history, and epidemiological information.  Fact Sheet for Patients:    Fact Sheet for Healthcare Providers: [facility]  This test is not yet approved or  cleared by the 06/20/20 FDA and has been authorized for detection and/or diagnosis of SARS-CoV-2 by FDA under an Emergency Use Authorization (EUA).  This EUA will remain in effect (meaning this test can be used) for the duration of the COVID-19 declaration under Section 564(b)(1) of the Act, 21 U.S.C. section 360bbb-3(b)(1), unless the authorization is terminated or revoked sooner.  Performed at [facility], [address] [place], [facility] [address]   Blood Culture (routine x 2)     Status: None (Preliminary result)   Collection Time: 04/19/20  7:13 AM   Specimen: BLOOD  Result Value Ref Range Status   Specimen Description BLOOD LEFT ANTECUBITAL  Final   Special Requests   Final    BOTTLES DRAWN AEROBIC AND ANAEROBIC Blood Culture adequate volume   Culture   Final    NO GROWTH 2 DAYS Performed at [facility], [address], [place], [facility] [address]    Report Status PENDING  Incomplete  Respiratory Panel by PCR     Status: None   Collection Time: 04/19/20 10:35 AM   Specimen: Nasopharyngeal Swab; Respiratory  Result Value Ref Range Status   Adenovirus NOT DETECTED NOT DETECTED Final   Coronavirus 229E NOT DETECTED NOT  DETECTED Final    Comment: (NOTE) The Coronavirus on the Respiratory Panel, DOES NOT test for the novel  Coronavirus (2019 nCoV)    Coronavirus HKU1 NOT DETECTED NOT DETECTED Final   Coronavirus NL63 NOT DETECTED NOT DETECTED Final   Coronavirus OC43 NOT DETECTED NOT DETECTED Final   Metapneumovirus NOT DETECTED NOT DETECTED Final   Rhinovirus / Enterovirus NOT DETECTED NOT DETECTED Final   Influenza Yaremi Stahlman NOT DETECTED NOT DETECTED Final   Influenza B NOT DETECTED NOT DETECTED Final   Parainfluenza Virus 1 NOT DETECTED NOT DETECTED Final   Parainfluenza Virus 2 NOT DETECTED NOT DETECTED Final   Parainfluenza Virus 3 NOT DETECTED NOT DETECTED Final   Parainfluenza Virus 4 NOT DETECTED NOT DETECTED Final   Respiratory Syncytial Virus NOT DETECTED NOT DETECTED Final   Bordetella pertussis NOT DETECTED NOT DETECTED Final   Chlamydophila pneumoniae NOT DETECTED NOT DETECTED Final   Mycoplasma pneumoniae NOT DETECTED NOT DETECTED Final    Comment: Performed at [facility], [address], [place], [place] [address]  SARS Coronavirus 2 by RT PCR (hospital order, performed in [facility] hospital lab) Nasopharyngeal Nasopharyngeal Swab     Status: None   Collection Time: 04/19/20 10:35 AM   Specimen: Nasopharyngeal Swab  Result Value Ref Range Status   SARS Coronavirus 2 NEGATIVE NEGATIVE Final    Comment: (NOTE) SARS-CoV-2 target nucleic acids are NOT DETECTED.  The SARS-CoV-2 RNA is generally detectable in upper and lower respiratory specimens during the acute phase of infection. The lowest concentration of SARS-CoV-2 viral copies this assay can detect is 250 copies / mL. Pearley Baranek negative result does not preclude SARS-CoV-2 infection and should not be used as the sole basis for treatment or other patient management decisions.  Khyron Garno negative result may occur with improper specimen collection / handling, submission of specimen other than nasopharyngeal swab, presence of viral mutation(s) within  the areas targeted by this assay, and inadequate number of viral copies (<250 copies / mL). Taisa Deloria negative result must be combined with clinical observations, patient history, and epidemiological information.  Fact Sheet for Patients:   BoilerBrush.com.cy  Fact Sheet for Healthcare Providers: https://pope.com/  This test is not yet approved or  cleared by the [place] FDA and has been authorized for detection and/or diagnosis of SARS-CoV-2 by FDA under an Emergency Use Authorization (EUA).  This EUA will remain in effect (meaning this test can be used) for the duration of the COVID-19 declaration under Section 564(b)(1) of the Act, 21 U.S.C. section 360bbb-3(b)(1), unless the authorization is terminated or revoked sooner.  Performed at [facility], [address], [place], [place] [address]          Radiology Studies: DG CHEST PORT 1 VIEW  Result Date: 04/21/2020 CLINICAL DATA:  Tachycardia and dizziness EXAM: PORTABLE CHEST 1 VIEW COMPARISON:  Chest radiograph dated 04/18/2020 FINDINGS: The heart size and mediastinal contours are within normal limits. Moderate bilateral peripheral and lower lung predominant interstitial and airspace opacities appear similar to prior exam. There is no pleural effusion or pneumothorax. The visualized skeletal structures are unremarkable. IMPRESSION: Moderate bilateral peripheral and lower lung predominant interstitial and airspace opacities appear similar to prior exam, may represent pulmonary edema or atypical infection. Electronically Signed   By: Romona Curls M.D.   On: 04/21/2020 13:20   ECHOCARDIOGRAM COMPLETE  Result Date: 04/21/2020    ECHOCARDIOGRAM REPORT   Patient Name:   MEIA EMLEY Date of Exam: 04/21/2020 Medical Rec #:  [id number]    Height:       64.0 in Accession #:  [id number]   Weight:       150.0 lb Date of Birth:  June 06, 1951     BSA:          1.731 m Patient Age:    68 years      BP:           123/92 mmHg Patient Gender: F            HR:           57 bpm. Exam Location:  Inpatient Procedure: 2D Echo Indications:    Atrial fibrillation (HCC)  History:        Patient has no prior history of Echocardiogram examinations.                 Signs/Symptoms:Fever. SVT  Afib/Flutter, Acute respiratory                 failure with Hypoxia,.  Sonographer:    Leta Jungling RDCS Referring Phys: [phone] Jibreel Fedewa CALDWELL POWELL JR IMPRESSIONS  1. Left ventricular ejection fraction, by estimation, is 50 to 55%. The left ventricle has low normal function. The left ventricle demonstrates global hypokinesis. Left ventricular diastolic parameters are indeterminate.  2. Right ventricular systolic function is normal. The right ventricular size is normal. Tricuspid regurgitation signal is inadequate for assessing PA pressure.  3. The mitral valve is grossly normal. Trivial mitral valve regurgitation.  4. The aortic valve is tricuspid. Aortic valve regurgitation is not visualized.  5. The inferior vena cava is normal in size with greater than 50% respiratory variability, suggesting right atrial pressure of 3 mmHg. FINDINGS  Left Ventricle: Left ventricular ejection fraction, by estimation, is 50 to 55%. The left ventricle has low normal function. The left ventricle demonstrates global hypokinesis. The left ventricular internal cavity size was normal in size. There is no left ventricular hypertrophy. Left ventricular diastolic parameters are indeterminate. Right Ventricle: The right ventricular size is normal. No increase in right ventricular wall thickness. Right ventricular systolic function is normal. Tricuspid regurgitation signal is inadequate for assessing PA pressure. Left Atrium: Left atrial size was normal in size. Right Atrium: Right atrial size was normal in size. Pericardium: There is no evidence of pericardial effusion. Mitral Valve: The mitral valve is grossly normal. Trivial mitral valve regurgitation.  Tricuspid Valve: The tricuspid valve is grossly normal. Tricuspid valve regurgitation is trivial. Aortic Valve: The aortic valve is tricuspid. Aortic valve regurgitation is not visualized. Mild aortic valve annular calcification. Pulmonic Valve: The pulmonic valve was not well visualized. Pulmonic valve regurgitation is trivial. Aorta: The aortic root is normal in size and structure. Venous: The inferior vena cava is normal in size with greater than 50% respiratory variability, suggesting right atrial pressure of 3 mmHg. IAS/Shunts: No atrial level shunt detected by color flow Doppler.  LEFT VENTRICLE PLAX 2D LVIDd:         4.20 cm  Diastology LVIDs:         3.10 cm  LV e' lateral:   8.27 cm/s LV PW:         0.70 cm  LV E/e' lateral: 8.8 LV IVS:        0.80 cm  LV e' medial:    6.31 cm/s LVOT diam:     1.70 cm  LV E/e' medial:  11.5 LV SV:         43 LV SV Index:   25 LVOT Area:     2.27 cm  RIGHT VENTRICLE RV S prime:  11.90 cm/s TAPSE (M-mode): 1.7 cm LEFT ATRIUM             Index LA diam:        3.10 cm 1.79 cm/m LA Vol (A2C):   40.7 ml 23.51 ml/m LA Vol (A4C):   43.8 ml 25.33 ml/m LA Biplane Vol: 41.6 ml 24.03 ml/m  AORTIC VALVE LVOT Vmax:   92.50 cm/s LVOT Vmean:  66.600 cm/s LVOT VTI:    0.189 m  AORTA Ao Root diam: 2.30 cm MITRAL VALVE MV Area (PHT): 3.31 cm    SHUNTS MV Decel Time: 229 msec    Systemic VTI:  0.19 m MV E velocity: 72.70 cm/s  Systemic Diam: 1.70 cm MV Libero Puthoff velocity: 68.60 cm/s MV E/Emelin Dascenzo ratio:  1.06 Nona Dell MD Electronically signed by Nona Dell MD Signature Date/Time: 04/21/2020/4:07:36 PM    Final    VAS [place] LOWER EXTREMITY VENOUS (DVT)  Result Date: 04/20/2020  Lower Venous DVT Study Indications: Elevated D-dimer, and SOB.  Comparison Study: No prior study on file Performing Technologist: Sherren Kerns RVS  Examination Guidelines: Omaya Nieland complete evaluation includes B-mode imaging, spectral Doppler, color Doppler, and power Doppler as needed of all accessible portions of each  vessel. Bilateral testing is considered an integral part of Javaya [place] complete examination. Limited examinations for reoccurring indications may be performed as noted. The reflux portion of the exam is performed with the patient in reverse Trendelenburg.  +---------+---------------+---------+-----------+----------+--------------+ RIGHT    CompressibilityPhasicitySpontaneityPropertiesThrombus Aging +---------+---------------+---------+-----------+----------+--------------+ CFV      Full           Yes      Yes                                 +---------+---------------+---------+-----------+----------+--------------+ SFJ      Full                                                        +---------+---------------+---------+-----------+----------+--------------+ FV Prox  Full                                                        +---------+---------------+---------+-----------+----------+--------------+ FV Mid   Full                                                        +---------+---------------+---------+-----------+----------+--------------+ FV DistalFull                                                        +---------+---------------+---------+-----------+----------+--------------+ PFV      Full                                                        +---------+---------------+---------+-----------+----------+--------------+  POP      Full           Yes      Yes                                 +---------+---------------+---------+-----------+----------+--------------+ PTV      Full                                                        +---------+---------------+---------+-----------+----------+--------------+ PERO     Full                                                        +---------+---------------+---------+-----------+----------+--------------+   +---------+---------------+---------+-----------+----------+--------------+ LEFT      CompressibilityPhasicitySpontaneityPropertiesThrombus Aging +---------+---------------+---------+-----------+----------+--------------+ CFV      Full           Yes      Yes                                 +---------+---------------+---------+-----------+----------+--------------+ SFJ      Full                                                        +---------+---------------+---------+-----------+----------+--------------+ FV Prox  Full                                                        +---------+---------------+---------+-----------+----------+--------------+ FV Mid   Full                                                        +---------+---------------+---------+-----------+----------+--------------+ FV DistalFull                                                        +---------+---------------+---------+-----------+----------+--------------+ PFV      Full                                                        +---------+---------------+---------+-----------+----------+--------------+ POP      Full           Yes      Yes                                 +---------+---------------+---------+-----------+----------+--------------+  PTV      Full                                                        +---------+---------------+---------+-----------+----------+--------------+ PERO     Full                                                        +---------+---------------+---------+-----------+----------+--------------+     Summary: BILATERAL: - No evidence of deep vein thrombosis seen in the lower extremities, bilaterally. -   *See table(s) above for measurements and observations. Electronically signed by Sherald Hess MD on 04/20/2020 at 1:19:20 PM.    Final         Scheduled Meds: . azithromycin  500 mg Oral Q breakfast  . dextromethorphan-guaiFENesin  1 tablet Oral BID  . diltiazem  30 mg Oral Q6H  . [START ON 04/23/2020] predniSONE  40  mg Oral Q breakfast  . simvastatin  20 mg Oral Daily   Continuous Infusions: . cefTRIAXone (ROCEPHIN)  IV 2 g (04/22/20 0537)  . diltiazem (CARDIZEM) infusion 5 mg/hr (04/22/20 0625)  . heparin 1,050 Units/hr (04/22/20 0936)     LOS: 3 days    Time spent: over 30 min    Lacretia Nicks, MD [facility]   To contact the attending provider between 7A-7P or the covering provider during after hours 7P-7A, please log into the web site [email or web address] and access using universal [place] password for that web site. If you do not have the password, please call the hospital operator.  04/22/2020, 10:49 AM

## 2020-04-22 NOTE — Progress Notes (Signed)
ANTICOAGULATION CONSULT NOTE  Pharmacy Consult for Heparin >> Apixaban Indication: atrial fibrillation  Allergies  Allergen Reactions  . Fish Allergy Other (See Comments)    Scallops  Reaction: Syncope    Patient Measurements: Height: 5\' 4"  (162.6 cm) Weight: 68 kg (150 lb) IBW/kg (Calculated) : 54.7 Heparin Dosing Weight: 68kg  Vital Signs: Temp: 97.8 F (36.6 C) (07/06 1646) Temp Source: Oral (07/06 1646) BP: 126/66 (07/06 1646) Pulse Rate: 79 (07/06 1646)  Labs: Recent Labs    04/20/20 0326 04/20/20 0326 04/21/20 0612 04/21/20 1054 04/21/20 1237 04/21/20 1855 04/22/20 0333  HGB 10.3*   < > 10.1*  --   --   --  10.0*  HCT 31.9*  --  32.1*  --   --   --  30.8*  PLT 287  --  324  --   --   --  312  APTT 35  --   --   --   --   --   --   LABPROT 14.2  --   --   --   --   --   --   INR 1.1  --   --   --   --   --   --   HEPARINUNFRC  --   --   --   --   --  0.33 0.30  CREATININE 0.78  --  0.83  --   --   --  0.68  TROPONINIHS  --   --   --  7 8  --   --    < > = values in this interval not displayed.    Estimated Creatinine Clearance: 64.6 mL/min (by C-G formula based on SCr of 0.68 mg/dL).   Assessment: 69 year old female with new atrial fibrillation/flutter with CHADSVASC 2 on IV heparin per pharmacy. Pharmacy now consulted to transition to Apixaban oral therapy.   CBC is stable. No bleeding noted. Heparin has been therapeutic.  SCr has been stable with current CrCl ~65 mL/min.  Moderate interaction with Diltiazem and Apixaban noted - monitor closely for any signs of bleeding.   Goal of Therapy:  Monitor platelets by anticoagulation protocol: Yes   Plan:  Start Apixaban 5 mg po BID. Stop IV Heparin AT THE SAME TIME the first dose of Apixaban therapy is given.  Monitor CBC and for any signs of symptoms of bleeding.   79, PharmD, BCPS, BCCCP Clinical Pharmacist Please refer to [facility] for [facility] Pharmacy numbers 04/22/2020, 5:06 PM

## 2020-04-22 NOTE — Progress Notes (Signed)
ANTICOAGULATION CONSULT NOTE  Pharmacy Consult for Heparin Indication: atrial fibrillation  Allergies  Allergen Reactions  . Fish Allergy Other (See Comments)    Scallops  Reaction: Syncope    Patient Measurements: Height: 5\' 4"  (162.6 cm) Weight: 68 kg (150 lb) IBW/kg (Calculated) : 54.7 Heparin Dosing Weight: 68kg  Vital Signs: Temp: 98.2 F (36.8 C) (07/06 0824) Temp Source: Oral (07/06 0824) BP: 102/62 (07/06 0824) Pulse Rate: 79 (07/06 0824)  Labs: Recent Labs    04/20/20 0326 04/20/20 0326 04/21/20 0612 04/21/20 1054 04/21/20 1237 04/21/20 1855 04/22/20 0333  HGB 10.3*   < > 10.1*  --   --   --  10.0*  HCT 31.9*  --  32.1*  --   --   --  30.8*  PLT 287  --  324  --   --   --  312  APTT 35  --   --   --   --   --   --   LABPROT 14.2  --   --   --   --   --   --   INR 1.1  --   --   --   --   --   --   HEPARINUNFRC  --   --   --   --   --  0.33 0.30  CREATININE 0.78  --  0.83  --   --   --  0.68  TROPONINIHS  --   --   --  7 8  --   --    < > = values in this interval not displayed.    Estimated Creatinine Clearance: 64.6 mL/min (by C-G formula based on SCr of 0.68 mg/dL).   Assessment: 69 year old female with new atrial fibrillation/flutter with CHADSVASC 2 to continue on IV heparin per pharmacy.  Heparin level is therapeutic and at the low end of normal.  CBC stable - no bleeding reported.  Goal of Therapy:  Heparin level 0.3-0.7 units/ml Monitor platelets by anticoagulation protocol: Yes   Plan:  Increase heparin gtt slightly to 1100 units/hr Daily heparin level and CBC  Breon Rehm D. 80, PharmD, BCPS, BCCCP 04/22/2020, 12:31 PM

## 2020-04-23 LAB — COMPREHENSIVE METABOLIC PANEL
ALT: 31 U/L (ref 0–44)
AST: 26 U/L (ref 15–41)
Albumin: 2.3 g/dL — ABNORMAL LOW (ref 3.5–5.0)
Alkaline Phosphatase: 87 U/L (ref 38–126)
Anion gap: 9 (ref 5–15)
BUN: 13 mg/dL (ref 8–23)
CO2: 28 mmol/L (ref 22–32)
Calcium: 9.2 mg/dL (ref 8.9–10.3)
Chloride: 102 mmol/L (ref 98–111)
Creatinine, Ser: 0.63 mg/dL (ref 0.44–1.00)
GFR calc Af Amer: >60 mL/min (ref >60)
GFR calc non Af Amer: >60 mL/min (ref >60)
Glucose, Bld: 141 mg/dL — ABNORMAL HIGH (ref 70–99)
Potassium: 4.4 mmol/L (ref 3.5–5.1)
Sodium: 139 mmol/L (ref 135–145)
Total Bilirubin: 0.2 mg/dL — ABNORMAL LOW (ref 0.3–1.2)
Total Protein: 6.6 g/dL (ref 6.5–8.1)

## 2020-04-23 LAB — CBC
HCT: 32.9 % — ABNORMAL LOW (ref 36.0–46.0)
Hemoglobin: 10.4 g/dL — ABNORMAL LOW (ref 12.0–15.0)
MCH: 31 pg (ref 26.0–34.0)
MCHC: 31.6 g/dL (ref 30.0–36.0)
MCV: 98.2 fL (ref 80.0–100.0)
Platelets: 339 10*3/uL (ref 150–400)
RBC: 3.35 MIL/uL — ABNORMAL LOW (ref 3.87–5.11)
RDW: 13.6 % (ref 11.5–15.5)
WBC: 6.9 10*3/uL (ref 4.0–10.5)
nRBC: 0 % (ref 0.0–0.2)

## 2020-04-23 LAB — PHOSPHORUS: Phosphorus: 4 mg/dL (ref 2.5–4.6)

## 2020-04-23 LAB — MAGNESIUM: Magnesium: 2.3 mg/dL (ref 1.7–2.4)

## 2020-04-23 MED ORDER — PREDNISONE 20 MG PO TABS: 40.0000 mg | ORAL_TABLET | Freq: Every day | ORAL | 0 refills | Status: DC

## 2020-04-23 MED ORDER — DILTIAZEM HCL ER COATED BEADS 120 MG PO CP24: 120.0000 mg | ORAL_CAPSULE | Freq: Every day | ORAL | 3 refills | Status: DC

## 2020-04-23 MED ORDER — DILTIAZEM HCL ER COATED BEADS 120 MG PO CP24
120.0000 mg | ORAL_CAPSULE | Freq: Every day | ORAL | Status: DC
  Administered 2020-04-23: 120 mg via ORAL
  Filled 2020-04-23: qty 1

## 2020-04-23 MED ORDER — APIXABAN 5 MG PO TABS: 5.0000 mg | ORAL_TABLET | Freq: Two times a day (BID) | ORAL | 3 refills | Status: DC

## 2020-04-23 MED FILL — ELIQUIS 5 MG TABLET: 5 | 30 days supply | Qty: 60 | Fill #0

## 2020-04-23 MED FILL — predniSONE 20 MG TABS: 20 | 3 days supply | Qty: 6 | Fill #0

## 2020-04-23 MED FILL — CARTIA XT 120 MG CP24: 120 | 30 days supply | Qty: 30 | Fill #0

## 2020-04-23 NOTE — Progress Notes (Signed)
Progress Note  Patient Name: Heather Koch Date of Encounter: 04/23/2020  [facility] Cardiologist: No primary care provider on file. New- Dr Tarrance Januszewski [place]  Subjective   Feels OK today. Notes some SOB when up. No cough.   Inpatient Medications    Scheduled Meds: . apixaban  5 mg Oral BID  . atorvastatin  10 mg Oral Daily  . azithromycin  500 mg Oral Q breakfast  . dextromethorphan-guaiFENesin  1 tablet Oral BID  . diltiazem  120 mg Oral Daily  . predniSONE  40 mg Oral Q breakfast   Continuous Infusions: . cefTRIAXone (ROCEPHIN)  IV 2 g (04/23/20 0518)   PRN Meds: acetaminophen **OR** acetaminophen, ALPRAZolam, chlorpheniramine-HYDROcodone, levalbuterol, ondansetron **OR** ondansetron (ZOFRAN) IV   Vital Signs    Vitals:   04/22/20 0824 04/22/20 1646 04/22/20 2316 04/22/20 2320  BP: 102/62 126/66 111/66   Pulse: 79 79 73   Resp:   20   Temp: 98.2 F (36.8 C) 97.8 F (36.6 C) 98.2 F (36.8 C)   TempSrc: Oral Oral    SpO2: 97% 99% (!) 87% 96%  Weight:      Height:        Intake/Output Summary (Last 24 hours) at 04/23/2020 0759 Last data filed at 04/22/2020 2100 Gross per 24 hour  Intake 120 ml  Output --  Net 120 ml   Last 3 Weights 04/19/2020 03/03/2020 11/28/2019  Weight (lbs) 150 lb 155 lb 153 lb  Weight (kg) 68.04 kg 70.308 kg 69.4 kg      Telemetry    NSR no Afib - Personally Reviewed  ECG    NSR with PACs otherwise normal - Personally Reviewed  Physical Exam   GEN: No acute distress.   Neck: No JVD Cardiac: RRR, no murmurs, rubs, or gallops.  Respiratory: Diffuse crackles on the right. GI: Soft, nontender, non-distended  MS: No edema; No deformity. Neuro:  Nonfocal  Psych: Normal affect   Labs    High Sensitivity Troponin:   Recent Labs  Lab 04/18/20 2231 04/19/20 0049 04/21/20 1054 04/21/20 1237  TROPONINIHS 6 8 7 8       Chemistry Recent Labs  Lab 04/21/20 0612 04/22/20 0333 04/23/20 0335  NA 141 134* 139  K 4.2 3.9 4.4  CL  106 98 102  CO2 26 25 28   GLUCOSE 100* 104* 141*  BUN 17 11 13   CREATININE 0.83 0.68 0.63  CALCIUM 9.6 8.9 9.2  PROT 5.8* 5.7* 6.6  ALBUMIN 2.2* 2.2* 2.3*  AST 27 25 26   ALT 32 28 31  ALKPHOS 91 86 87  BILITOT 0.3 0.4 0.2*  GFRNONAA >60 >60 >60  GFRAA >60 >60 >60  ANIONGAP 9 11 9      Hematology Recent Labs  Lab 04/21/20 0612 04/22/20 0333 04/23/20 0335  WBC 10.8* 10.7* 6.9  RBC 3.21* 3.15* 3.35*  HGB 10.1* 10.0* 10.4*  HCT 32.1* 30.8* 32.9*  MCV 100.0 97.8 98.2  MCH 31.5 31.7 31.0  MCHC 31.5 32.5 31.6  RDW 13.7 14.0 13.6  PLT 324 312 339    BNP Recent Labs  Lab 04/21/20 0612  BNP 148.3*     DDimer  Recent Labs  Lab 04/19/20 0653  DDIMER 1.98*     Radiology    DG CHEST PORT 1 VIEW  Result Date: 04/21/2020 CLINICAL DATA:  Tachycardia and dizziness EXAM: PORTABLE CHEST 1 VIEW COMPARISON:  Chest radiograph dated 04/18/2020 FINDINGS: The heart size and mediastinal contours are within normal limits. Moderate bilateral  peripheral and lower lung predominant interstitial and airspace opacities appear similar to prior exam. There is no pleural effusion or pneumothorax. The visualized skeletal structures are unremarkable. IMPRESSION: Moderate bilateral peripheral and lower lung predominant interstitial and airspace opacities appear similar to prior exam, may represent pulmonary edema or atypical infection. Electronically Signed   By: Romona Curls M.D.   On: 04/21/2020 13:20   ECHOCARDIOGRAM COMPLETE  Result Date: 04/21/2020    ECHOCARDIOGRAM REPORT   Patient Name:   Heather Koch Date of Exam: 04/21/2020 Medical Rec #:  [id number]    Height:       64.0 in Accession #:    [id number]   Weight:       150.0 lb Date of Birth:  [birth year]     BSA:          1.731 m Patient Age:    69 years     BP:           123/92 mmHg Patient Gender: F            HR:           57 bpm. Exam Location:  Inpatient Procedure: 2D Echo Indications:    Atrial fibrillation (HCC)  History:        Patient has  no prior history of Echocardiogram examinations.                 Signs/Symptoms:Fever. SVT  Afib/Flutter, Acute respiratory                 failure with Hypoxia,.  Sonographer:    Leta Jungling RDCS Referring Phys: [phone] A CALDWELL POWELL JR IMPRESSIONS  1. Left ventricular ejection fraction, by estimation, is 50 to 55%. The left ventricle has low normal function. The left ventricle demonstrates global hypokinesis. Left ventricular diastolic parameters are indeterminate.  2. Right ventricular systolic function is normal. The right ventricular size is normal. Tricuspid regurgitation signal is inadequate for assessing PA pressure.  3. The mitral valve is grossly normal. Trivial mitral valve regurgitation.  4. The aortic valve is tricuspid. Aortic valve regurgitation is not visualized.  5. The inferior vena cava is normal in size with greater than 50% respiratory variability, suggesting right atrial pressure of 3 mmHg. FINDINGS  Left Ventricle: Left ventricular ejection fraction, by estimation, is 50 to 55%. The left ventricle has low normal function. The left ventricle demonstrates global hypokinesis. The left ventricular internal cavity size was normal in size. There is no left ventricular hypertrophy. Left ventricular diastolic parameters are indeterminate. Right Ventricle: The right ventricular size is normal. No increase in right ventricular wall thickness. Right ventricular systolic function is normal. Tricuspid regurgitation signal is inadequate for assessing PA pressure. Left Atrium: Left atrial size was normal in size. Right Atrium: Right atrial size was normal in size. Pericardium: There is no evidence of pericardial effusion. Mitral Valve: The mitral valve is grossly normal. Trivial mitral valve regurgitation. Tricuspid Valve: The tricuspid valve is grossly normal. Tricuspid valve regurgitation is trivial. Aortic Valve: The aortic valve is tricuspid. Aortic valve regurgitation is not visualized. Mild  aortic valve annular calcification. Pulmonic Valve: The pulmonic valve was not well visualized. Pulmonic valve regurgitation is trivial. Aorta: The aortic root is normal in size and structure. Venous: The inferior vena cava is normal in size with greater than 50% respiratory variability, suggesting right atrial pressure of 3 mmHg. IAS/Shunts: No atrial level shunt detected by color flow Doppler.  LEFT VENTRICLE PLAX 2D LVIDd:  4.20 cm  Diastology LVIDs:         3.10 cm  LV e' lateral:   8.27 cm/s LV PW:         0.70 cm  LV E/e' lateral: 8.8 LV IVS:        0.80 cm  LV e' medial:    6.31 cm/s LVOT diam:     1.70 cm  LV E/e' medial:  11.5 LV SV:         43 LV SV Index:   25 LVOT Area:     2.27 cm  RIGHT VENTRICLE RV S prime:     11.90 cm/s TAPSE (M-mode): 1.7 cm LEFT ATRIUM             Index LA diam:        3.10 cm 1.79 cm/m LA Vol (A2C):   40.7 ml 23.51 ml/m LA Vol (A4C):   43.8 ml 25.33 ml/m LA Biplane Vol: 41.6 ml 24.03 ml/m  AORTIC VALVE LVOT Vmax:   92.50 cm/s LVOT Vmean:  66.600 cm/s LVOT VTI:    0.189 m  AORTA Ao Root diam: 2.30 cm MITRAL VALVE MV Area (PHT): 3.31 cm    SHUNTS MV Decel Time: 229 msec    Systemic VTI:  0.19 m MV E velocity: 72.70 cm/s  Systemic Diam: 1.70 cm MV A velocity: 68.60 cm/s MV E/A ratio:  1.06 Nona Dell MD Electronically signed by Nona Dell MD Signature Date/Time: 04/21/2020/4:07:36 PM    Final     Cardiac Studies   See Echo above  Patient Profile     69 y.o. female with a hx of RA who is being seen today for the evaluation of paroxysmal atrial fibrillation/flutter  at the request of Dr Lowell Guitar.  Assessment & Plan    1. Paroxysmal atrial fibrillation/flutter. Noted in setting of multilobar PNA. Self terminating after 3 hours. Patient does have some history of palpitations PTA so may have had in the past but I suspect more likely triggered by PNA. She has a [place] vasc score of 2 based on age and sex. For now recommend rate control with diltiazem. Will  switch to diltiazem CD today 120 mg daily. I would also recommend anticoagulation for now with Eliquis. Avoid ASA and NSAIDs. After PNA resolved and patient is back in steady state we may want to consider an outpatient event monitor to see if she is having other episodes of afib/flutter. If not then we could consider stopping anticoagulation later.  2. Multilobar PNA atypical. Per primary care team 3. RA.  [facility] will sign off.   Medication Recommendations:  As per [facility] Other recommendations (labs, testing, etc):  none Follow up as an outpatient:  3 weeks with Dr [place]  For questions or updates, please contact [facility] Please consult [email or web address] for contact info under        Signed, Maripat Borba [place], MD  04/23/2020, 7:59 AM

## 2020-04-23 NOTE — TOC Benefit Eligibility Note (Signed)
Transition of Care [facility]) Benefit Eligibility Note    Patient Details  Name: Heather Koch MRN: [id number] Date of Birth: 1951-03-12   Medication/Dose: Everlene Balls 5 MG BID  Covered?: Yes  Tier: 3 Drug  Prescription Coverage Preferred Pharmacy: [facility] [address] with Person/Company/Phone Number:: ANN  @ [employer] #  [phone]  Co-Pay: $47.00  Prior Approval: No  Deductible: Unmet (OUT-OF-POCKET:UNMET)       Mardene Sayer Phone Number: 04/23/2020, 12:51 PM

## 2020-04-23 NOTE — Progress Notes (Signed)
SATURATION QUALIFICATIONS: (This note is used to comply with regulatory documentation for home oxygen)  Patient Saturations on Room Air at Rest = 96 %  Patient Saturations on Room Air while Ambulating = 92 %  

## 2020-04-23 NOTE — TOC Transition Note (Signed)
Transition of Care [facility]) - CM/SW Discharge Note   Patient Details  Name: Heather Koch MRN: [id number] Date of Birth: 26-Apr-1951  Transition of Care [facility]) CM/SW Contact:  Beckie Busing, RN Phone Number: [phone]  04/23/2020, 1:41 PM   Clinical Narrative:   CM consulted for Eliquis benefits check. Patient has a $47 copay for Eliquis script. CM gave patient a 30 day trial offer card. Patient states that $47 co pay is affordable. Patient has no further needs at this time. CM will sign off.     Final next level of care: Home/Self Care Barriers to Discharge: No Barriers Identified   Patient Goals and CMS Choice Patient states their goals for this hospitalization and ongoing recovery are:: Ready to go home   Choice offered to / list presented to : NA  Discharge Placement                       Discharge Plan and Services                DME Arranged: N/A DME Agency: NA       HH Arranged: NA HH Agency: NA        Social Determinants of Health (SDOH) Interventions     Readmission Risk Interventions No flowsheet data found.

## 2020-04-23 NOTE — Discharge Summary (Signed)
Physician Discharge Summary  KEYLEN UZELAC UYQ:[id number] DOB: [birth year] DOA: 04/18/2020  PCP: Burton Apley, MD  Admit date: 04/18/2020 Discharge date: 04/23/2020  Admitted From: Home  Disposition:  Home    Recommendations for Outpatient Follow-up:  1. Follow up with PCP in 2-4 weeks 2. Dr. Su Hilt: Please obtain CXR in 4 weeks to document resolution of pneumonia and refer to pulmonology if residual infiltrates 3. Follow up with Cardiology for new onset Afib in 4 weeks      Home Health: None  Equipment/Devices: None  Discharge Condition: Good  CODE STATUS: FULL Diet recommendation: Regular  Brief/Interim Summary: Mrs. Bezanson is a 69 y.o. F with RA on methotrexate who presented with 2 weeks fever, generalized body aches, nonproductive cough, shortness of breath.  In the ER, CT angiogram of the chest showed no PE but showed diffuse groundglass opacities, suspicious for atypical pneumonia.  She was started on azithromycin and ceftriaxone and admitted to the hospitalist service.       PRINCIPAL HOSPITAL DIAGNOSIS: Atypical pneumonia    Discharge Diagnoses:  Atypical pneumonia, multifocal Patient admitted with CT showing bilateral multifocal opacities.  Had leukocytosis and fevers, failed outpatient amoxicillin.    Here started on CTX and azithromycin.  Legionella negative.  Respiratory virus panel negative.  Covid PCR negative x2, and patient vaccinated, doubted.    Leukocytosis resolved with treatment, symptoms overall gradual improvement with azithromycin.  Suspect atypical bacterial pneumonia.  Viral pneumonia possible (false negative) vs less likely rheumatoid lung.  She completed 5 days azithromycin, and 6 days of ceftriaxone.  Now mentating at baseline, taking orals.  Temp < 100 F, heart rate < 100bpm, RR < 24, SpO2 at baseline.   Stable for discharge.   -PCP to obtain CXR in 4 weeks  New onset atrial fibrillation, paroxysmal Patient developed atrial fibrillation  briefly.  TSH normal, echocardiogram showed low normal EF.  CHA2DS2-VASc 2, started on Eliquis.  Cardiology were consulted, recommended anticoagulation and diltiazem. -Follow-up with cardiology in 1 month -Cardiology to discuss further to continue anticoagulation lifelong, or consider prolonged outpatient monitoring for further risk ratification     Discharge Instructions  Discharge Instructions    Discharge instructions   Complete by: As directed    From Dr. Maryfrances Bunnell: You were admitted for pneumonia  You were treated with ceftriaxone/Rocephin and azithromycin/Zithromax  You completed the course of both and your temperature, heart rate, breathing rate and oxygen needs returned to normal.  You should continue taking prednisone 40 mg (2 tabs) once daily in the morning for three more days  You should see Dr. Su Hilt your primary care in 2 weeks Ask him to repeat your chest x-ray in 4 weeks to confirm it is back to normal    While you were here, you were noted to have new onset atrial fibrillation From now on, you should take the two following medicines: Apixaban/Eliquis 5 mg twice daily for blood thinner Diltiazem 120 mg once daily for heart slowing   Go see the heart specialist in 4-6 weeks and ask them if you need to continue these medicines long term   Delay restarting methotrexate for another 3 weeks, then resume Resume your other home medicines   Increase activity slowly   Complete by: As directed      Allergies as of 04/23/2020      Reactions   Fish Allergy Other (See Comments)   Scallops  Reaction: Syncope      Medication List    STOP taking these  medications   amoxicillin-clavulanate 875-125 MG tablet Commonly known as: AUGMENTIN     TAKE these medications   ALPRAZolam 1 MG tablet Commonly known as: XANAX Take 0.25-1 mg by mouth at bedtime as needed for anxiety or sleep.   apixaban 5 MG Tabs tablet Commonly known as: ELIQUIS Take 1 tablet (5 mg total)  by mouth 2 (two) times daily.   clobetasol cream 0.05 % Commonly known as: TEMOVATE APPLY TOPICALLY AT BEDTIME AS NEEDED FOR ITCHING What changed:   how much to take  how to take this  when to take this  reasons to take this  additional instructions   diclofenac sodium 1 % Gel Commonly known as: VOLTAREN Apply 3 gm to 3 large joints up to 3 times a day.Dispense 3 tubes with 3 refills. What changed:   how much to take  how to take this  when to take this  reasons to take this  additional instructions   diltiazem 120 MG 24 hr capsule Commonly known as: CARDIZEM CD Take 1 capsule (120 mg total) by mouth daily. Start taking on: April 24, 2020   folic acid 1 MG tablet Commonly known as: FOLVITE Take 2 tablets (2 mg total) by mouth daily.   methotrexate (PF) 250 MG/10ML injection INJECT 0.8ML INTO THE SKIN ONCE WEEKLY What changed: See the new instructions. Notes to patient: ONCE WEEKLY   MULTIVITAMIN PO Take 1 tablet by mouth daily.   predniSONE 20 MG tablet Commonly known as: DELTASONE Take 2 tablets (40 mg total) by mouth daily with breakfast. Start taking on: April 24, 2020   simvastatin 20 MG tablet Commonly known as: ZOCOR Take 1 tablet by mouth daily.   TUBERCULIN SYR 1CC/27GX1/2" 27G X 1/2" 1 ML Misc Commonly known as: B-D TB SYRINGE 1CC/27GX1/2" use to inject methotrexate weekly Notes to patient: Use weekly   VITAMIN D PO Take 1 tablet by mouth daily.       Follow-up Information    Abelino Derrick, PA-C. Go on 05/21/2020.   Specialties: Cardiology, Radiology Why: @8 :45am for hospital follow up with Dr. Elvis Coil PA. Please arrive 10 minutes early  Contact information: [address] [place] [address] [phone]              Allergies  Allergen Reactions  . Fish Allergy Other (See Comments)    Scallops  Reaction: Syncope    Consultations:  Cardiology   Procedures/Studies: DG Chest 2 View  Result Date:  04/18/2020 CLINICAL DATA:  Shortness of breath EXAM: CHEST - 2 VIEW COMPARISON:  04/16/2020 FINDINGS: No consolidation or pleural effusion. Mild diffuse increased interstitial and ground-glass opacity. Stable cardiomediastinal silhouette. No pneumothorax. IMPRESSION: Mild diffuse increased interstitial and ground-glass opacity compared to prior suggesting interstitial inflammatory process/possible atypical infection versus less likely edema Electronically Signed   By: Jasmine Pang M.D.   On: 04/18/2020 23:00   DG Chest 2 View  Result Date: 04/17/2020 CLINICAL DATA:  Fever, cough, pneumonia EXAM: CHEST - 2 VIEW COMPARISON:  04/08/2017 FINDINGS: The heart size and mediastinal contours are within normal limits. Mild, diffuse interstitial pulmonary opacity. Disc degenerative disease of the thoracic spine. IMPRESSION: Mild, diffuse interstitial pulmonary opacity, consistent with edema or infection. No focal airspace opacity. Electronically Signed   By: Lauralyn Primes M.D.   On: 04/17/2020 10:43   CT ANGIO CHEST PE W OR WO CONTRAST  Result Date: 04/19/2020 CLINICAL DATA:  Shortness of breath for 1 week EXAM: CT ANGIOGRAPHY CHEST WITH CONTRAST  TECHNIQUE: Multidetector CT imaging of the chest was performed using the standard protocol during bolus administration of intravenous contrast. Multiplanar CT image reconstructions and MIPs were obtained to evaluate the vascular anatomy. CONTRAST:  75mL OMNIPAQUE IOHEXOL 350 MG/ML SOLN COMPARISON:  Chest x-ray April 18, 2020 FINDINGS: Cardiovascular: Satisfactory opacification of the pulmonary arteries to the segmental level. No evidence of pulmonary embolism. Normal heart size. No pericardial effusion. Mediastinum/Nodes: No enlarged mediastinal, hilar, or axillary lymph nodes. Thyroid gland, trachea, and esophagus demonstrate no significant findings. Lungs/Pleura: Patchy consolidation is identified throughout all lobes of bilateral lungs. Minimal bilateral pleural effusions are  noted. Upper Abdomen: No acute abnormality. Musculoskeletal: No acute abnormality is identified. Degenerative joint changes of the spine are noted. 1.3 cm nodule is identified in the central posterior left breast series 5, image 48. Review of the MIP images confirms the above findings. IMPRESSION: 1. No pulmonary embolus. 2. Patchy consolidation is identified throughout all lobes of bilateral lungs. This is consistent with multifocal pneumonia. 3. Minimal bilateral pleural effusions. 4. 1.3 cm nodule is identified in the central posterior left breast. Recommend further evaluation with diagnostic mammogram and ultrasound. Electronically Signed   By: Sherian Rein M.D.   On: 04/19/2020 09:32   DG CHEST PORT 1 VIEW  Result Date: 04/21/2020 CLINICAL DATA:  Tachycardia and dizziness EXAM: PORTABLE CHEST 1 VIEW COMPARISON:  Chest radiograph dated 04/18/2020 FINDINGS: The heart size and mediastinal contours are within normal limits. Moderate bilateral peripheral and lower lung predominant interstitial and airspace opacities appear similar to prior exam. There is no pleural effusion or pneumothorax. The visualized skeletal structures are unremarkable. IMPRESSION: Moderate bilateral peripheral and lower lung predominant interstitial and airspace opacities appear similar to prior exam, may represent pulmonary edema or atypical infection. Electronically Signed   By: Romona Curls M.D.   On: 04/21/2020 13:20   ECHOCARDIOGRAM COMPLETE  Result Date: 04/21/2020    ECHOCARDIOGRAM REPORT   Patient Name:   EMELYNN RANCE Date of Exam: 04/21/2020 Medical Rec #:  [id number]    Height:       64.0 in Accession #:    [id number]   Weight:       150.0 lb Date of Birth:  31-Oct-1950     BSA:          1.731 m Patient Age:    68 years     BP:           123/92 mmHg Patient Gender: F            HR:           57 bpm. Exam Location:  Inpatient Procedure: 2D Echo Indications:    Atrial fibrillation (HCC)  History:        Patient has no prior history  of Echocardiogram examinations.                 Signs/Symptoms:Fever. SVT  Afib/Flutter, Acute respiratory                 failure with Hypoxia,.  Sonographer:    Leta Jungling RDCS Referring Phys: [phone] A CALDWELL POWELL JR IMPRESSIONS  1. Left ventricular ejection fraction, by estimation, is 50 to 55%. The left ventricle has low normal function. The left ventricle demonstrates global hypokinesis. Left ventricular diastolic parameters are indeterminate.  2. Right ventricular systolic function is normal. The right ventricular size is normal. Tricuspid regurgitation signal is inadequate for assessing PA pressure.  3. The mitral valve is grossly normal. Trivial  mitral valve regurgitation.  4. The aortic valve is tricuspid. Aortic valve regurgitation is not visualized.  5. The inferior vena cava is normal in size with greater than 50% respiratory variability, suggesting right atrial pressure of 3 mmHg. FINDINGS  Left Ventricle: Left ventricular ejection fraction, by estimation, is 50 to 55%. The left ventricle has low normal function. The left ventricle demonstrates global hypokinesis. The left ventricular internal cavity size was normal in size. There is no left ventricular hypertrophy. Left ventricular diastolic parameters are indeterminate. Right Ventricle: The right ventricular size is normal. No increase in right ventricular wall thickness. Right ventricular systolic function is normal. Tricuspid regurgitation signal is inadequate for assessing PA pressure. Left Atrium: Left atrial size was normal in size. Right Atrium: Right atrial size was normal in size. Pericardium: There is no evidence of pericardial effusion. Mitral Valve: The mitral valve is grossly normal. Trivial mitral valve regurgitation. Tricuspid Valve: The tricuspid valve is grossly normal. Tricuspid valve regurgitation is trivial. Aortic Valve: The aortic valve is tricuspid. Aortic valve regurgitation is not visualized. Mild aortic valve annular  calcification. Pulmonic Valve: The pulmonic valve was not well visualized. Pulmonic valve regurgitation is trivial. Aorta: The aortic root is normal in size and structure. Venous: The inferior vena cava is normal in size with greater than 50% respiratory variability, suggesting right atrial pressure of 3 mmHg. IAS/Shunts: No atrial level shunt detected by color flow Doppler.  LEFT VENTRICLE PLAX 2D LVIDd:         4.20 cm  Diastology LVIDs:         3.10 cm  LV e' lateral:   8.27 cm/s LV PW:         0.70 cm  LV E/e' lateral: 8.8 LV IVS:        0.80 cm  LV e' medial:    6.31 cm/s LVOT diam:     1.70 cm  LV E/e' medial:  11.5 LV SV:         43 LV SV Index:   25 LVOT Area:     2.27 cm  RIGHT VENTRICLE RV S prime:     11.90 cm/s TAPSE (M-mode): 1.7 cm LEFT ATRIUM             Index LA diam:        3.10 cm 1.79 cm/m LA Vol (A2C):   40.7 ml 23.51 ml/m LA Vol (A4C):   43.8 ml 25.33 ml/m LA Biplane Vol: 41.6 ml 24.03 ml/m  AORTIC VALVE LVOT Vmax:   92.50 cm/s LVOT Vmean:  66.600 cm/s LVOT VTI:    0.189 m  AORTA Ao Root diam: 2.30 cm MITRAL VALVE MV Area (PHT): 3.31 cm    SHUNTS MV Decel Time: 229 msec    Systemic VTI:  0.19 m MV E velocity: 72.70 cm/s  Systemic Diam: 1.70 cm MV A velocity: 68.60 cm/s MV E/A ratio:  1.06 Nona Dell MD Electronically signed by Nona Dell MD Signature Date/Time: 04/21/2020/4:07:36 PM    Final    VAS [place] LOWER EXTREMITY VENOUS (DVT)  Result Date: 04/20/2020  Lower Venous DVT Study Indications: Elevated D-dimer, and SOB.  Comparison Study: No prior study on file Performing Technologist: Sherren Kerns RVS  Examination Guidelines: A complete evaluation includes B-mode imaging, spectral Doppler, color Doppler, and power Doppler as needed of all accessible portions of each vessel. Bilateral testing is considered an integral part of a complete examination. Limited examinations for reoccurring indications may be performed as noted. The reflux portion  of the exam is performed with the  patient in reverse Trendelenburg.  +---------+---------------+---------+-----------+----------+--------------+ RIGHT    CompressibilityPhasicitySpontaneityPropertiesThrombus Aging +---------+---------------+---------+-----------+----------+--------------+ CFV      Full           Yes      Yes                                 +---------+---------------+---------+-----------+----------+--------------+ SFJ      Full                                                        +---------+---------------+---------+-----------+----------+--------------+ FV Prox  Full                                                        +---------+---------------+---------+-----------+----------+--------------+ FV Mid   Full                                                        +---------+---------------+---------+-----------+----------+--------------+ FV DistalFull                                                        +---------+---------------+---------+-----------+----------+--------------+ PFV      Full                                                        +---------+---------------+---------+-----------+----------+--------------+ POP      Full           Yes      Yes                                 +---------+---------------+---------+-----------+----------+--------------+ PTV      Full                                                        +---------+---------------+---------+-----------+----------+--------------+ PERO     Full                                                        +---------+---------------+---------+-----------+----------+--------------+   +---------+---------------+---------+-----------+----------+--------------+ LEFT     CompressibilityPhasicitySpontaneityPropertiesThrombus Aging +---------+---------------+---------+-----------+----------+--------------+ CFV      Full           Yes      Yes                                  +---------+---------------+---------+-----------+----------+--------------+  SFJ      Full                                                        +---------+---------------+---------+-----------+----------+--------------+ FV Prox  Full                                                        +---------+---------------+---------+-----------+----------+--------------+ FV Mid   Full                                                        +---------+---------------+---------+-----------+----------+--------------+ FV DistalFull                                                        +---------+---------------+---------+-----------+----------+--------------+ PFV      Full                                                        +---------+---------------+---------+-----------+----------+--------------+ POP      Full           Yes      Yes                                 +---------+---------------+---------+-----------+----------+--------------+ PTV      Full                                                        +---------+---------------+---------+-----------+----------+--------------+ PERO     Full                                                        +---------+---------------+---------+-----------+----------+--------------+     Summary: BILATERAL: - No evidence of deep vein thrombosis seen in the lower extremities, bilaterally. -   *See table(s) above for measurements and observations. Electronically signed by Sherald Hess MD on 04/20/2020 at 1:19:20 PM.    Final       Subjective: Patient still has a cough, she still feels relatively weak.  She has no fever, confusion, vomiting.  She is a little out of breath with ambulation.  Discharge Exam: Vitals:   04/23/20 0812 04/23/20 1616  BP: 108/60 128/60  Pulse: 65 87  Resp:    Temp: 98 F (36.7 C) 98 F (36.7 C)  SpO2: 94%  95%   Vitals:   04/22/20 2316 04/22/20 2320 04/23/20 0812 04/23/20 1616  BP:  111/66  108/60 128/60  Pulse: 73  65 87  Resp: 20     Temp: 98.2 F (36.8 C)  98 F (36.7 C) 98 F (36.7 C)  TempSrc:      SpO2: (!) 87% 96% 94% 95%  Weight:      Height:        General: Pt is alert, awake, not in acute distress Cardiovascular: RRR, nl S1-S2, no murmurs appreciated.   No LE edema.   Respiratory: Normal respiratory rate and rhythm.  Bilaterally, no wheezing. Abdominal: Abdomen soft and non-tender.  No distension or HSM.   Neuro/Psych: Strength symmetric in upper and lower extremities.  Judgment and insight appear normal.   The results of significant diagnostics from this hospitalization (including imaging, microbiology, ancillary and laboratory) are listed below for reference.     Microbiology: Recent Results (from the past 240 hour(s))  SARS Coronavirus 2 by RT PCR (hospital order, performed in [facility] hospital lab) Nasopharyngeal Nasopharyngeal Swab     Status: None   Collection Time: 04/19/20  3:18 AM   Specimen: Nasopharyngeal Swab  Result Value Ref Range Status   SARS Coronavirus 2 NEGATIVE NEGATIVE Final    Comment: (NOTE) SARS-CoV-2 target nucleic acids are NOT DETECTED.  The SARS-CoV-2 RNA is generally detectable in upper and lower respiratory specimens during the acute phase of infection. The lowest concentration of SARS-CoV-2 viral copies this assay can detect is 250 copies / mL. A negative result does not preclude SARS-CoV-2 infection and should not be used as the sole basis for treatment or other patient management decisions.  A negative result may occur with improper specimen collection / handling, submission of specimen other than nasopharyngeal swab, presence of viral mutation(s) within the areas targeted by this assay, and inadequate number of viral copies (<250 copies / mL). A negative result must be combined with clinical observations, patient history, and epidemiological information.  Fact Sheet for Patients:    BoilerBrush.com.cy  Fact Sheet for Healthcare Providers: https://pope.com/  This test is not yet approved or  cleared by the [place] FDA and has been authorized for detection and/or diagnosis of SARS-CoV-2 by FDA under an Emergency Use Authorization (EUA).  This EUA will remain in effect (meaning this test can be used) for the duration of the COVID-19 declaration under Section 564(b)(1) of the Act, 21 U.S.C. section 360bbb-3(b)(1), unless the authorization is terminated or revoked sooner.  Performed at [facility], [address] [place], [place] [address]   Blood Culture (routine x 2)     Status: None (Preliminary result)   Collection Time: 04/19/20  7:13 AM   Specimen: BLOOD  Result Value Ref Range Status   Specimen Description BLOOD LEFT ANTECUBITAL  Final   Special Requests   Final    BOTTLES DRAWN AEROBIC AND ANAEROBIC Blood Culture adequate volume   Culture   Final    NO GROWTH 4 DAYS Performed at [facility], [address] [place], [place] [address]    Report Status PENDING  Incomplete  Respiratory Panel by PCR     Status: None   Collection Time: 04/19/20 10:35 AM   Specimen: Nasopharyngeal Swab; Respiratory  Result Value Ref Range Status   Adenovirus NOT DETECTED NOT DETECTED Final   Coronavirus 229E NOT DETECTED NOT DETECTED Final    Comment: (NOTE) The Coronavirus on the Respiratory Panel, DOES NOT  test for the novel  Coronavirus (2019 nCoV)    Coronavirus HKU1 NOT DETECTED NOT DETECTED Final   Coronavirus NL63 NOT DETECTED NOT DETECTED Final   Coronavirus OC43 NOT DETECTED NOT DETECTED Final   Metapneumovirus NOT DETECTED NOT DETECTED Final   Rhinovirus / Enterovirus NOT DETECTED NOT DETECTED Final   Influenza A NOT DETECTED NOT DETECTED Final   Influenza B NOT DETECTED NOT DETECTED Final   Parainfluenza Virus 1 NOT DETECTED NOT DETECTED Final   Parainfluenza Virus 2 NOT DETECTED NOT  DETECTED Final   Parainfluenza Virus 3 NOT DETECTED NOT DETECTED Final   Parainfluenza Virus 4 NOT DETECTED NOT DETECTED Final   Respiratory Syncytial Virus NOT DETECTED NOT DETECTED Final   Bordetella pertussis NOT DETECTED NOT DETECTED Final   Chlamydophila pneumoniae NOT DETECTED NOT DETECTED Final   Mycoplasma pneumoniae NOT DETECTED NOT DETECTED Final    Comment: Performed at [facility], [address], [place], [place] [address]  SARS Coronavirus 2 by RT PCR (hospital order, performed in [facility] hospital lab) Nasopharyngeal Nasopharyngeal Swab     Status: None   Collection Time: 04/19/20 10:35 AM   Specimen: Nasopharyngeal Swab  Result Value Ref Range Status   SARS Coronavirus 2 NEGATIVE NEGATIVE Final    Comment: (NOTE) SARS-CoV-2 target nucleic acids are NOT DETECTED.  The SARS-CoV-2 RNA is generally detectable in upper and lower respiratory specimens during the acute phase of infection. The lowest concentration of SARS-CoV-2 viral copies this assay can detect is 250 copies / mL. A negative result does not preclude SARS-CoV-2 infection and should not be used as the sole basis for treatment or other patient management decisions.  A negative result may occur with improper specimen collection / handling, submission of specimen other than nasopharyngeal swab, presence of viral mutation(s) within the areas targeted by this assay, and inadequate number of viral copies (<250 copies / mL). A negative result must be combined with clinical observations, patient history, and epidemiological information.  Fact Sheet for Patients:   BoilerBrush.com.cy  Fact Sheet for Healthcare Providers: https://pope.com/  This test is not yet approved or  cleared by the [place] FDA and has been authorized for detection and/or diagnosis of SARS-CoV-2 by FDA under an Emergency Use Authorization (EUA).  This EUA will remain in effect  (meaning this test can be used) for the duration of the COVID-19 declaration under Section 564(b)(1) of the Act, 21 U.S.C. section 360bbb-3(b)(1), unless the authorization is terminated or revoked sooner.  Performed at [facility], [address], [place], [place] [address]      Labs: BNP (last 3 results) Recent Labs    04/21/20 0612  BNP 148.3*   Basic Metabolic Panel: Recent Labs  Lab 04/19/20 0653 04/20/20 0326 04/21/20 0612 04/22/20 0333 04/23/20 0335  NA 134* 139 141 134* 139  K 4.0 4.3 4.2 3.9 4.4  CL 99 104 106 98 102  CO2 22 24 26 25 28   GLUCOSE 143* 135* 100* 104* 141*  BUN 11 13 17 11 13   CREATININE 0.83 0.78 0.83 0.68 0.63  CALCIUM 9.3 9.4 9.6 8.9 9.2  MG  --  1.9 1.8 1.6* 2.3  PHOS  --  4.2 4.6 3.3 4.0   Liver Function Tests: Recent Labs  Lab 04/19/20 0653 04/20/20 0326 04/21/20 0612 04/22/20 0333 04/23/20 0335  AST 49* 29 27 25 26   ALT 42 33 32 28 31  ALKPHOS 118 100 91 86 87  BILITOT 0.5 0.3  0.3 0.4 0.2*  PROT 6.3* 6.0* 5.8* 5.7* 6.6  ALBUMIN 2.5* 2.2* 2.2* 2.2* 2.3*   No results for input(s): LIPASE, AMYLASE in the last 168 hours. No results for input(s): AMMONIA in the last 168 hours. CBC: Recent Labs  Lab 04/19/20 0653 04/20/20 0326 04/21/20 0612 04/22/20 0333 04/23/20 0335  WBC 12.3* 12.2* 10.8* 10.7* 6.9  NEUTROABS  --   --  7.1  --   --   HGB 10.8* 10.3* 10.1* 10.0* 10.4*  HCT 33.3* 31.9* 32.1* 30.8* 32.9*  MCV 99.1 99.4 100.0 97.8 98.2  PLT 298 287 324 312 339   Cardiac Enzymes: No results for input(s): CKTOTAL, CKMB, CKMBINDEX, TROPONINI in the last 168 hours. BNP: Invalid input(s): POCBNP CBG: No results for input(s): GLUCAP in the last 168 hours. D-Dimer No results for input(s): DDIMER in the last 72 hours. Hgb A1c No results for input(s): HGBA1C in the last 72 hours. Lipid Profile No results for input(s): CHOL, HDL, LDLCALC, TRIG, CHOLHDL, LDLDIRECT in the last 72 hours. Thyroid function studies Recent  Labs    04/21/20 1054  TSH 1.424   Anemia work up No results for input(s): VITAMINB12, FOLATE, FERRITIN, TIBC, IRON, RETICCTPCT in the last 72 hours. Urinalysis    Component Value Date/Time   COLORURINE YELLOW 08/15/2014 1024   APPEARANCEUR CLOUDY (A) 08/15/2014 1024   LABSPEC 1.020 08/15/2014 1024   PHURINE 5.5 08/15/2014 1024   GLUCOSEU NEG 08/15/2014 1024   HGBUR TRACE (A) 08/15/2014 1024   BILIRUBINUR NEG 08/15/2014 1024   KETONESUR NEG 08/15/2014 1024   PROTEINUR TRACE 08/15/2014 1024   UROBILINOGEN 0.2 08/15/2014 1024   NITRITE POS (A) 08/15/2014 1024   LEUKOCYTESUR MOD (A) 08/15/2014 1024   Sepsis Labs Invalid input(s): PROCALCITONIN,  WBC,  LACTICIDVEN Microbiology Recent Results (from the past 240 hour(s))  SARS Coronavirus 2 by RT PCR (hospital order, performed in [facility] hospital lab) Nasopharyngeal Nasopharyngeal Swab     Status: None   Collection Time: 04/19/20  3:18 AM   Specimen: Nasopharyngeal Swab  Result Value Ref Range Status   SARS Coronavirus 2 NEGATIVE NEGATIVE Final    Comment: (NOTE) SARS-CoV-2 target nucleic acids are NOT DETECTED.  The SARS-CoV-2 RNA is generally detectable in upper and lower respiratory specimens during the acute phase of infection. The lowest concentration of SARS-CoV-2 viral copies this assay can detect is 250 copies / mL. A negative result does not preclude SARS-CoV-2 infection and should not be used as the sole basis for treatment or other patient management decisions.  A negative result may occur with improper specimen collection / handling, submission of specimen other than nasopharyngeal swab, presence of viral mutation(s) within the areas targeted by this assay, and inadequate number of viral copies (<250 copies / mL). A negative result must be combined with clinical observations, patient history, and epidemiological information.  Fact Sheet for Patients:   BoilerBrush.com.cy  Fact Sheet  for Healthcare Providers: https://pope.com/  This test is not yet approved or  cleared by the [place] FDA and has been authorized for detection and/or diagnosis of SARS-CoV-2 by FDA under an Emergency Use Authorization (EUA).  This EUA will remain in effect (meaning this test can be used) for the duration of the COVID-19 declaration under Section 564(b)(1) of the Act, 21 U.S.C. section 360bbb-3(b)(1), unless the authorization is terminated or revoked sooner.  Performed at [facility], [address], [place], [place] [address]   Blood Culture (routine x 2)  Status: None (Preliminary result)   Collection Time: 04/19/20  7:13 AM   Specimen: BLOOD  Result Value Ref Range Status   Specimen Description BLOOD LEFT ANTECUBITAL  Final   Special Requests   Final    BOTTLES DRAWN AEROBIC AND ANAEROBIC Blood Culture adequate volume   Culture   Final    NO GROWTH 4 DAYS Performed at [facility], [address], [place], [place] [address]    Report Status PENDING  Incomplete  Respiratory Panel by PCR     Status: None   Collection Time: 04/19/20 10:35 AM   Specimen: Nasopharyngeal Swab; Respiratory  Result Value Ref Range Status   Adenovirus NOT DETECTED NOT DETECTED Final   Coronavirus 229E NOT DETECTED NOT DETECTED Final    Comment: (NOTE) The Coronavirus on the Respiratory Panel, DOES NOT test for the novel  Coronavirus (2019 nCoV)    Coronavirus HKU1 NOT DETECTED NOT DETECTED Final   Coronavirus NL63 NOT DETECTED NOT DETECTED Final   Coronavirus OC43 NOT DETECTED NOT DETECTED Final   Metapneumovirus NOT DETECTED NOT DETECTED Final   Rhinovirus / Enterovirus NOT DETECTED NOT DETECTED Final   Influenza A NOT DETECTED NOT DETECTED Final   Influenza B NOT DETECTED NOT DETECTED Final   Parainfluenza Virus 1 NOT DETECTED NOT DETECTED Final   Parainfluenza Virus 2 NOT DETECTED NOT DETECTED Final   Parainfluenza Virus 3 NOT DETECTED NOT  DETECTED Final   Parainfluenza Virus 4 NOT DETECTED NOT DETECTED Final   Respiratory Syncytial Virus NOT DETECTED NOT DETECTED Final   Bordetella pertussis NOT DETECTED NOT DETECTED Final   Chlamydophila pneumoniae NOT DETECTED NOT DETECTED Final   Mycoplasma pneumoniae NOT DETECTED NOT DETECTED Final    Comment: Performed at [facility], [address], [place], [place] [address]  SARS Coronavirus 2 by RT PCR (hospital order, performed in [facility] hospital lab) Nasopharyngeal Nasopharyngeal Swab     Status: None   Collection Time: 04/19/20 10:35 AM   Specimen: Nasopharyngeal Swab  Result Value Ref Range Status   SARS Coronavirus 2 NEGATIVE NEGATIVE Final    Comment: (NOTE) SARS-CoV-2 target nucleic acids are NOT DETECTED.  The SARS-CoV-2 RNA is generally detectable in upper and lower respiratory specimens during the acute phase of infection. The lowest concentration of SARS-CoV-2 viral copies this assay can detect is 250 copies / mL. A negative result does not preclude SARS-CoV-2 infection and should not be used as the sole basis for treatment or other patient management decisions.  A negative result may occur with improper specimen collection / handling, submission of specimen other than nasopharyngeal swab, presence of viral mutation(s) within the areas targeted by this assay, and inadequate number of viral copies (<250 copies / mL). A negative result must be combined with clinical observations, patient history, and epidemiological information.  Fact Sheet for Patients:   BoilerBrush.com.cy  Fact Sheet for Healthcare Providers: https://pope.com/  This test is not yet approved or  cleared by the [place] FDA and has been authorized for detection and/or diagnosis of SARS-CoV-2 by FDA under an Emergency Use Authorization (EUA).  This EUA will remain in effect (meaning this test can be used) for the duration of  the COVID-19 declaration under Section 564(b)(1) of the Act, 21 U.S.C. section 360bbb-3(b)(1), unless the authorization is terminated or revoked sooner.  Performed at [facility], [address] [place], [place] [address]      Time coordinating discharge: 45 minutes  SIGNED:   Alberteen Sam, MD  [facility] 04/23/2020, 8:34 PM

## 2020-04-23 NOTE — Discharge Instructions (Signed)

## 2020-04-24 LAB — CULTURE, BLOOD (ROUTINE X 2)
Culture: NO GROWTH
Special Requests: ADEQUATE

## 2020-05-05 ENCOUNTER — Other Ambulatory Visit: Payer: Self-pay | Admitting: Rheumatology

## 2020-05-05 DIAGNOSIS — M0579 Rheumatoid arthritis with rheumatoid factor of multiple sites without organ or systems involvement: Secondary | ICD-10-CM

## 2020-05-07 ENCOUNTER — Ambulatory Visit: Payer: Medicare Other | Admitting: Rheumatology

## 2020-05-08 ENCOUNTER — Other Ambulatory Visit: Payer: Self-pay | Admitting: Rheumatology

## 2020-05-08 DIAGNOSIS — M0579 Rheumatoid arthritis with rheumatoid factor of multiple sites without organ or systems involvement: Secondary | ICD-10-CM

## 2020-05-08 NOTE — Telephone Encounter (Signed)
Last Visit: 03/03/2020 Next Visit: 08/05/2020 Labs: 04/23/2020 Albumin 2.3, Total Bilirubin 0.2, RBC 3.35, Hgb 10.4 Hct 32.9  Current Dose per office note 03/03/2020: Methotrexate 0.8 ml every 7 days,  DX: Rheumatoid arthritis   Okay to refill MTX?

## 2020-05-19 ENCOUNTER — Telehealth: Payer: Self-pay | Admitting: Rheumatology

## 2020-05-19 NOTE — Telephone Encounter (Signed)
Patient advised a prescription was sent to the pharmacy on 05/08/2020. Patient was recently in the hospital for pneumonia. Patient advised she will need to be cleared by her doctor prior to restarting the MTX.

## 2020-05-19 NOTE — Telephone Encounter (Signed)
Patient called stating she has called [facility] pharmacy two times to request a refill of her Methotrexate.  Patient states the pharmacy told her they have not received a prescription from Dr. Fatima Sanger office.  Patient is requesting a return call.

## 2020-05-21 ENCOUNTER — Telehealth: Payer: Self-pay | Admitting: Radiology

## 2020-05-21 ENCOUNTER — Other Ambulatory Visit: Payer: Self-pay

## 2020-05-21 ENCOUNTER — Ambulatory Visit: Payer: Medicare Other | Admitting: Cardiology

## 2020-05-21 ENCOUNTER — Encounter: Payer: Self-pay | Admitting: Cardiology

## 2020-05-21 VITALS — BP 122/76 | HR 83 | Ht 64.0 in | Wt 148.6 lb

## 2020-05-21 DIAGNOSIS — I480 Paroxysmal atrial fibrillation: Secondary | ICD-10-CM

## 2020-05-21 DIAGNOSIS — M0579 Rheumatoid arthritis with rheumatoid factor of multiple sites without organ or systems involvement: Secondary | ICD-10-CM

## 2020-05-21 DIAGNOSIS — Z7901 Long term (current) use of anticoagulants: Secondary | ICD-10-CM | POA: Diagnosis not present

## 2020-05-21 DIAGNOSIS — R0989 Other specified symptoms and signs involving the circulatory and respiratory systems: Secondary | ICD-10-CM

## 2020-05-21 DIAGNOSIS — J189 Pneumonia, unspecified organism: Secondary | ICD-10-CM

## 2020-05-21 MED ORDER — METOPROLOL TARTRATE 25 MG PO TABS
25.0000 mg | ORAL_TABLET | ORAL | 2 refills | Status: DC | PRN
Start: 2020-05-21 — End: 2020-12-01

## 2020-05-21 NOTE — Assessment & Plan Note (Signed)
Admitted 7/3-/7/72021 with atypical pnuemonia

## 2020-05-21 NOTE — Telephone Encounter (Signed)
Enrolled patient for a 14 day Zio XT  monitor to be mailed to patients home  °

## 2020-05-21 NOTE — Assessment & Plan Note (Signed)
PAF noted July 2021 in setting of CAP. The patient apparently has a long history of palpitations and tachycardia. She has had recurrent PAF since discharge despite increased Diltiazem.

## 2020-05-21 NOTE — Assessment & Plan Note (Signed)
This is not the first time this has been noted.  It sounds like pulmonary fibrosis though she has never seen a pulmonologist and her CT scan did not mention this- read as "patchy consolidation"

## 2020-05-21 NOTE — Assessment & Plan Note (Signed)
She tells me this is under good control

## 2020-05-21 NOTE — Patient Instructions (Signed)
Medication Instructions:  START- Metoprolol Tartrate 25 mg as needed for heart rate greater than 120 BPM.  *If you need a refill on your cardiac medications before your next appointment, please call your pharmacy*   Lab Work: None Ordered  Testing/Procedures: Your physician has recommended that you wear a 14 days monitor. Holter monitors are medical devices that record the heart's electrical activity. Doctors most often use these monitors to diagnose arrhythmias. Arrhythmias are problems with the speed or rhythm of the heartbeat. The monitor is a small, portable device. You can wear one while you do your normal daily activities. This is usually used to diagnose what is causing palpitations/syncope (passing out).   Follow-Up: At [facility], you and your health needs are our priority.  As part of our continuing mission to provide you with exceptional heart care, we have created designated Provider Care Teams.  These Care Teams include your primary Cardiologist (physician) and Advanced Practice Providers (APPs -  Physician Assistants and Nurse Practitioners) who all work together to provide you with the care you need, when you need it.  We recommend signing up for the patient portal called "MyChart".  Sign up information is provided on this After Visit Summary.  MyChart is used to connect with patients for Virtual Visits (Telemedicine).  Patients are able to view lab/test results, encounter notes, upcoming appointments, etc.  Non-urgent messages can be sent to your provider as well.   To learn more about what you can do with MyChart, go to [email or web address].    Your next appointment:   6 week(s)  The format for your next appointment:   In Person  Provider:   [facility]   Other Instructions ZIO XT- Long Term Monitor Instructions   Your physician has requested you wear your ZIO patch monitor 14 days.   This is a single patch monitor.  [employer] supplies one patch monitor per  enrollment.  Additional stickers are not available.   Please do not apply patch if you will be having a Nuclear Stress Test, Echocardiogram, Cardiac CT, MRI, or Chest Xray during the time frame you would be wearing the monitor. The patch cannot be worn during these tests.  You cannot remove and re-apply the ZIO XT patch monitor.   Your ZIO patch monitor will be sent USPS Priority mail from [facility] directly to your home address. The monitor may also be mailed to a PO BOX if home delivery is not available.   It may take 3-5 days to receive your monitor after you have been enrolled.   Once you have received you monitor, please review enclosed instructions.  Your monitor has already been registered assigning a specific monitor serial # to you.   Applying the monitor   Shave hair from upper left chest.   Hold abrader disc by orange tab.  Rub abrader in 40 strokes over left upper chest as indicated in your monitor instructions.   Clean area with 4 enclosed alcohol pads .  Use all pads to assure are is cleaned thoroughly.  Let dry.   Apply patch as indicated in monitor instructions.  Patch will be place under collarbone on left side of chest with arrow pointing upward.   Rub patch adhesive wings for 2 minutes.Remove white label marked "1".  Remove white label marked "2".  Rub patch adhesive wings for 2 additional minutes.   While looking in a mirror, press and release button in center of patch.  A small green light will flash  3-4 times .  This will be your only indicator the monitor has been turned on.     Do not shower for the first 24 hours.  You may shower after the first 24 hours.   Press button if you feel a symptom. You will hear a small click.  Record Date, Time and Symptom in the Patient Log Book.   When you are ready to remove patch, follow instructions on last 2 pages of Patient Log Book.  Stick patch monitor onto last page of Patient Log Book.   Place Patient Log Book in  [place] box.  Use locking tab on box and tape box closed securely.  The [employer] and [employer] has [employer] on it.  Please place in mailbox as soon as possible.  Your physician should have your test results approximately 7 days after the monitor has been mailed back to [facility].   Call [facility] Customer Care at [phone] if you have questions regarding your ZIO XT patch monitor.  Call them immediately if you see an orange light blinking on your monitor.   If your monitor falls off in less than 4 days contact our Monitor department at [phone].  If your monitor becomes loose or falls off after 4 days call [employer] at [phone] for suggestions on securing your monitor.

## 2020-05-21 NOTE — Progress Notes (Signed)
Cardiology Office Note:    Date:  05/21/2020   ID:  [name], DOB 1951-04-24, MRN [id number]  PCP:  Burton Apley, MD  Cardiologist:  Dr Swaziland Electrophysiologist:  None   Referring MD: Burton Apley, MD   CC: post hospital follow up  History of Present Illness:    KRYSTYNA CLECKLEY is a 69 y.o. female with a hx of rheumatoid arthritis that the patient tells me is under good control, and anxiety.  She has a long history of palpitations.  She did see a cardiologist at [facility] 20 years ago.  She never followed up.  She had a monitor placed in the past but it was unrevealing.  The patient presented to the hospital 04/18/2020.  She was diagnosed with atypical pneumonia.  She was Covid negative.  The day of discharge she was noted to have atrial fibrillation that lasted about 3 hours.  She converted after diltiazem was added.  The patient was discharged 04/23/2020.  Echocardiogram during that admission showed normal LV function with an EF of 50 to 55% and normal left atrial size.  Her TSH was normal.  She was discharged on diltiazem 120 mg a day.  Two days after discharge she said she had increased palpitations and tachycardia.  She saw her primary care provider who increased her diltiazem from 120 mg daily to 240 mg daily.  She did have another episode 48 hours ago where her heart rate was over 110 and she felt weak.  She also says her blood pressure machine at home registered low blood pressures with a systolic reading in the 90s.  Today she is in normal sinus rhythm in the office.  She does have some baseline anxiety and is medication averse but understands her current problem will require medications.  She has been compliant with her Eliquis and diltiazem 240 mg.  Past Medical History:  Diagnosis Date  . ASCUS (atypical squamous cells of undetermined significance) on Pap smear 12/2008   neg HR HPV  . LGSIL (low grade squamous intraepithelial dysplasia) 2005/2006  . Lichen  sclerosus 2012  . Rheumatoid arthritis(714.0) 2012    Past Surgical History:  Procedure Laterality Date  . TUBAL LIGATION  1997    Current Medications: Current Meds  Medication Sig  . ALPRAZolam (XANAX) 1 MG tablet Take 0.25-1 mg by mouth at bedtime as needed for anxiety or sleep.   Marland Kitchen apixaban (ELIQUIS) 5 MG TABS tablet Take 1 tablet (5 mg total) by mouth 2 (two) times daily.  . clobetasol cream (TEMOVATE) 0.05 % APPLY TOPICALLY AT BEDTIME AS NEEDED FOR ITCHING (Patient taking differently: Apply 1 application topically at bedtime as needed (Itching). )  . diclofenac sodium (VOLTAREN) 1 % GEL Apply 3 gm to 3 large joints up to 3 times a day.Dispense 3 tubes with 3 refills. (Patient taking differently: Apply 3 g topically 3 (three) times daily as needed (Joint pain). )  . diltiazem (CARDIZEM CD) 240 MG 24 hr capsule Take 240 mg by mouth daily.  . folic acid (FOLVITE) 1 MG tablet Take 2 tablets (2 mg total) by mouth daily.  . Methotrexate Sodium (METHOTREXATE, PF,) 250 MG/10ML injection INJECT 0.8ML INTO THE SKIN ONCE WEEKLY  . Multiple Vitamin (MULTIVITAMIN PO) Take 1 tablet by mouth daily.   . Needles & Syringes MISC by Does not apply route.  . simvastatin (ZOCOR) 20 MG tablet Take 1 tablet by mouth daily.  Marland Kitchen VITAMIN D PO Take 1 tablet by mouth  daily.      Allergies:   Fish allergy   Social History   Socioeconomic History  . Marital status: Married    Spouse name: Not on file  . Number of children: Not on file  . Years of education: Not on file  . Highest education level: Not on file  Occupational History  . Not on file  Tobacco Use  . Smoking status: Never Smoker  . Smokeless tobacco: Never Used  Vaping Use  . Vaping Use: Never used  Substance and Sexual Activity  . Alcohol use: No  . Drug use: No  . Sexual activity: Yes    Partners: Male    Birth control/protection: Post-menopausal  Other Topics Concern  . Not on file  Social History Narrative  . Not on file    Social Determinants of Health   Financial Resource Strain:   . Difficulty of Paying Living Expenses:   Food Insecurity:   . Worried About Programme researcher, broadcasting/film/video in the Last Year:   . Barista in the Last Year:   Transportation Needs:   . Freight forwarder (Medical):   Marland Kitchen Lack of Transportation (Non-Medical):   Physical Activity:   . Days of Exercise per Week:   . Minutes of Exercise per Session:   Stress:   . Feeling of Stress :   Social Connections:   . Frequency of Communication with Friends and Family:   . Frequency of Social Gatherings with Friends and Family:   . Attends Religious Services:   . Active Member of Clubs or Organizations:   . Attends Banker Meetings:   Marland Kitchen Marital Status:      Family History: The patient's family history includes Cancer in her brother, father, maternal aunt, and maternal uncle; Diabetes in her mother; Healthy in her daughter, son, and son; Heart disease in her brother, sister, sister, and sister; Hypertension in her mother.  ROS:   Please see the history of present illness.     All other systems reviewed and are negative.  EKGs/Labs/Other Studies Reviewed:    The following studies were reviewed today: Echo 04/21/2020- IMPRESSIONS    1. Left ventricular ejection fraction, by estimation, is 50 to 55%. The  left ventricle has low normal function. The left ventricle demonstrates  global hypokinesis. Left ventricular diastolic parameters are  indeterminate.  2. Right ventricular systolic function is normal. The right ventricular  size is normal. Tricuspid regurgitation signal is inadequate for assessing  PA pressure.  3. The mitral valve is grossly normal. Trivial mitral valve  regurgitation.  4. The aortic valve is tricuspid. Aortic valve regurgitation is not  visualized.  5. The inferior vena cava is normal in size with greater than 50%  respiratory variability, suggesting right atrial pressure of 3 mmHg.    EKG:  EKG is not ordered today.  The ekg ordered 04/25/2020 demonstrates NSR- HR 100  Recent Labs: 04/21/2020: B Natriuretic Peptide 148.3; TSH 1.424 04/23/2020: ALT 31; BUN 13; Creatinine, Ser 0.63; Hemoglobin 10.4; Magnesium 2.3; Platelets 339; Potassium 4.4; Sodium 139  Recent Lipid Panel    Component Value Date/Time   TRIG 65 04/19/2020 0653    Physical Exam:    VS:  BP 122/76   Pulse 83   Ht 5\' 4"  (1.626 m)   Wt 148 lb 9.6 oz (67.4 kg)   LMP 03/31/2003   SpO2 95%   BMI 25.51 kg/m     Wt Readings from Last 3 Encounters:  05/21/20 148 lb 9.6 oz (67.4 kg)  04/19/20 150 lb (68 kg)  03/03/20 155 lb (70.3 kg)     GEN: Thin Caucasian female, well developed in no acute distress HEENT: Normal NECK: No JVD; No carotid bruits CARDIAC: RRR, no murmurs, rubs, gallops RESPIRATORY: bilateral velcro crackles ABDOMEN: Soft, non-tender, non-distended MUSCULOSKELETAL:  No edema; No deformity  SKIN: Warm and dry NEUROLOGIC:  Alert and oriented x 3 PSYCHIATRIC:  Normal affect   ASSESSMENT:    Paroxysmal atrial fibrillation (HCC) PAF noted July 2021 in setting of CAP. The patient apparently has a long history of palpitations and tachycardia. She has had recurrent PAF since discharge despite increased Diltiazem.   Anticoagulated Eliquis added July 2021 for PAF, CHADS VASC=2  Atypical pneumonia Admitted 7/3-/7/72021 with atypical pnuemonia  Rheumatoid arthritis involving multiple sites with positive rheumatoid factor (HCC) She tells me this is under good control  Abnormal lung sounds This is not the first time this has been noted.  It sounds like pulmonary fibrosis though she has never seen a pulmonologist and her CT scan did not mention this- read as "patchy consolidation"   PLAN:    14 day ZIO.  Add Metoprolol 25 mg QD PRN HR > 120 sustained.    She has a f/u CXR next week with her PCP, she denies any SOB (I wonder if she has chronic underlying ling disease from her RA).     F/U in AF clinic after her ZIO (6 weeks), long term f/u with Dr Swaziland.    Medication Adjustments/Labs and Tests Ordered: Current medicines are reviewed at length with the patient today.  Concerns regarding medicines are outlined above.  Orders Placed This Encounter  Procedures  . LONG TERM MONITOR (3-14 DAYS)   Meds ordered this encounter  Medications  . metoprolol tartrate (LOPRESSOR) 25 MG tablet    Sig: Take 1 tablet (25 mg total) by mouth as needed. For heart rate greater than 120    Dispense:  90 tablet    Refill:  2    Patient Instructions  Medication Instructions:  START- Metoprolol Tartrate 25 mg as needed for heart rate greater than 120 BPM.  *If you need a refill on your cardiac medications before your next appointment, please call your pharmacy*   Lab Work: None Ordered  Testing/Procedures: Your physician has recommended that you wear a 14 days monitor. Holter monitors are medical devices that record the heart's electrical activity. Doctors most often use these monitors to diagnose arrhythmias. Arrhythmias are problems with the speed or rhythm of the heartbeat. The monitor is a small, portable device. You can wear one while you do your normal daily activities. This is usually used to diagnose what is causing palpitations/syncope (passing out).   Follow-Up: At [facility], you and your health needs are our priority.  As part of our continuing mission to provide you with exceptional heart care, we have created designated Provider Care Teams.  These Care Teams include your primary Cardiologist (physician) and Advanced Practice Providers (APPs -  Physician Assistants and Nurse Practitioners) who all work together to provide you with the care you need, when you need it.  We recommend signing up for the patient portal called "MyChart".  Sign up information is provided on this After Visit Summary.  MyChart is used to connect with patients for Virtual Visits  (Telemedicine).  Patients are able to view lab/test results, encounter notes, upcoming appointments, etc.  Non-urgent messages can be sent to your provider as  well.   To learn more about what you can do with MyChart, go to [email or web address].    Your next appointment:   6 week(s)  The format for your next appointment:   In Person  Provider:   [facility]   Other Instructions ZIO XT- Long Term Monitor Instructions   Your physician has requested you wear your ZIO patch monitor 14 days.   This is a single patch monitor.  [employer] supplies one patch monitor per enrollment.  Additional stickers are not available.   Please do not apply patch if you will be having a Nuclear Stress Test, Echocardiogram, Cardiac CT, MRI, or Chest Xray during the time frame you would be wearing the monitor. The patch cannot be worn during these tests.  You cannot remove and re-apply the ZIO XT patch monitor.   Your ZIO patch monitor will be sent USPS Priority mail from [facility] directly to your home address. The monitor may also be mailed to a PO BOX if home delivery is not available.   It may take 3-5 days to receive your monitor after you have been enrolled.   Once you have received you monitor, please review enclosed instructions.  Your monitor has already been registered assigning a specific monitor serial # to you.   Applying the monitor   Shave hair from upper left chest.   Hold abrader disc by orange tab.  Rub abrader in 40 strokes over left upper chest as indicated in your monitor instructions.   Clean area with 4 enclosed alcohol pads .  Use all pads to assure are is cleaned thoroughly.  Let dry.   Apply patch as indicated in monitor instructions.  Patch will be place under collarbone on left side of chest with arrow pointing upward.   Rub patch adhesive wings for 2 minutes.Remove white label marked "1".  Remove white label marked "2".  Rub patch adhesive wings for 2 additional  minutes.   While looking in a mirror, press and release button in center of patch.  A small green light will flash 3-4 times .  This will be your only indicator the monitor has been turned on.     Do not shower for the first 24 hours.  You may shower after the first 24 hours.   Press button if you feel a symptom. You will hear a small click.  Record Date, Time and Symptom in the Patient Log Book.   When you are ready to remove patch, follow instructions on last 2 pages of Patient Log Book.  Stick patch monitor onto last page of Patient Log Book.   Place Patient Log Book in [place] box.  Use locking tab on box and tape box closed securely.  The [employer] and [employer] has [employer] on it.  Please place in mailbox as soon as possible.  Your physician should have your test results approximately 7 days after the monitor has been mailed back to [facility].   Call [facility] Customer Care at [phone] if you have questions regarding your ZIO XT patch monitor.  Call them immediately if you see an orange light blinking on your monitor.   If your monitor falls off in less than 4 days contact our Monitor department at [phone].  If your monitor becomes loose or falls off after 4 days call [employer] at [phone] for suggestions on securing your monitor.       Jolene Provost, PA-C  05/21/2020 9:37 AM    [facility]

## 2020-05-21 NOTE — Assessment & Plan Note (Signed)
Eliquis added July 2021 for PAF, CHADS VASC=2

## 2020-05-22 ENCOUNTER — Other Ambulatory Visit: Payer: Self-pay | Admitting: Cardiology

## 2020-05-22 MED ORDER — DILTIAZEM HCL ER COATED BEADS 240 MG PO CP24: 240.0000 mg | ORAL_CAPSULE | Freq: Every day | ORAL | 3 refills | Status: DC

## 2020-05-22 MED ORDER — APIXABAN 5 MG PO TABS: 5.0000 mg | ORAL_TABLET | Freq: Two times a day (BID) | ORAL | 3 refills | Status: DC

## 2020-05-22 NOTE — Telephone Encounter (Signed)
*  STAT* If patient is at the pharmacy, call can be transferred to refill team.   1. Which medications need to be refilled? (please list name of each medication and dose if known)  apixaban (ELIQUIS) 5 MG TABS tablet diltiazem (CARDIZEM CD) 240 MG 24 hr capsule    2. Which pharmacy/location (including street and city if local pharmacy) is medication to be sent to? [facility]  FAX [phone]  3. Do they need a 30 day or 90 day supply? 90 day

## 2020-05-26 ENCOUNTER — Other Ambulatory Visit: Payer: Self-pay | Admitting: Internal Medicine

## 2020-05-26 ENCOUNTER — Ambulatory Visit
Admission: RE | Admit: 2020-05-26 | Discharge: 2020-05-26 | Disposition: A | Discharge status: Home / Self Care | Payer: Medicare Other | Source: Ambulatory Visit | Attending: Internal Medicine | Admitting: Internal Medicine

## 2020-05-26 DIAGNOSIS — J189 Pneumonia, unspecified organism: Secondary | ICD-10-CM

## 2020-05-27 ENCOUNTER — Other Ambulatory Visit (INDEPENDENT_AMBULATORY_CARE_PROVIDER_SITE_OTHER): Payer: Medicare Other

## 2020-05-27 DIAGNOSIS — I480 Paroxysmal atrial fibrillation: Secondary | ICD-10-CM | POA: Diagnosis not present

## 2020-06-13 ENCOUNTER — Ambulatory Visit: Payer: Medicare Other | Admitting: Emergency Medicine

## 2020-06-13 ENCOUNTER — Other Ambulatory Visit: Payer: Self-pay

## 2020-06-13 ENCOUNTER — Encounter: Payer: Self-pay | Admitting: Emergency Medicine

## 2020-06-13 DIAGNOSIS — R9389 Abnormal findings on diagnostic imaging of other specified body structures: Secondary | ICD-10-CM

## 2020-06-13 DIAGNOSIS — R0683 Snoring: Secondary | ICD-10-CM | POA: Diagnosis not present

## 2020-06-13 NOTE — Assessment & Plan Note (Signed)
Bilateral pulmonary infiltrates that were identified in early July and in the setting of apparent infectious syndrome, consider bacterial pneumonia or viral pneumonia (RVP and COVID-19 testing negative).  These have not resolved on subsequent chest x-ray 05/26/2020 and I suspect that there is a noninfectious process, question due to rheumatoid lung, methotrexate.  She has crackles on exam and inspiratory squeaks that would be consistent with this.  I suppose it is also possible that there could be a component of cardiogenic pulmonary edema since she has newly identified atrial fibrillation but I believe that this is less likely.  Depending on the CT results will need to consider any other possible evaluation including even bronchoscopy for culture data, biopsies.  If we believe that this is rheumatoid lung then she may merit a steroid course.  Will need to discuss with Dr. Link Snuffer

## 2020-06-13 NOTE — Assessment & Plan Note (Signed)
She may need a PSG at some point going forward.

## 2020-06-13 NOTE — Progress Notes (Signed)
Subjective:    Patient ID: [name], female    DOB: 1951-08-09, 69 y.o.   MRN: [id number]  HPI 69 year old woman, never smoker, with a history of rheumatoid arthritis on long term methotrexate, new observation atrial fibrillation on recent hospitalization.  In June was treated for suspected bronchitis as an outpt w abx - "felt bad", low grade fever.  Admitted 04/18/2020 with fever, generalized body aches, dyspnea and nonproductive cough.  Her COVID-19 testing was negative, RVP negative.  A CT scan of the chest was done on 04/19/2020 which I have reviewed, showed no pulmonary embolism, multilobar patchy groundglass pulmonary infiltrates with small bilateral pleural effusions.  She was treated with azithromycin and ceftriaxone with slow clinical improvement.  She had hypoxemia that was slow to resolve.  As above she was found to have atrial fibrillation at the end of the hospitalization. Her MTX has been held since June due to this illness.    Review of Systems As per HPI  Past Medical History:  Diagnosis Date  . ASCUS (atypical squamous cells of undetermined significance) on Pap smear 12/2008   neg HR HPV  . LGSIL (low grade squamous intraepithelial dysplasia) 2005/2006  . Lichen sclerosus 2012  . Rheumatoid arthritis(714.0) 2012     Family History  Problem Relation Age of Onset  . Hypertension Mother   . Diabetes Mother   . Cancer Father        liver  . Heart disease Sister   . Cancer Brother        pancreatic cancer  . Cancer Maternal Aunt        throat  . Heart disease Sister   . Heart disease Sister   . Cancer Maternal Uncle        Colon  . Heart disease Brother        pacemaker  . Healthy Son   . Healthy Son   . Healthy Daughter      Social History   Socioeconomic History  . Marital status: Married    Spouse name: Not on file  . Number of children: Not on file  . Years of education: Not on file  . Highest education level: Not on file  Occupational History  .  Not on file  Tobacco Use  . Smoking status: Never Smoker  . Smokeless tobacco: Never Used  Vaping Use  . Vaping Use: Never used  Substance and Sexual Activity  . Alcohol use: No  . Drug use: No  . Sexual activity: Yes    Partners: Male    Birth control/protection: Post-menopausal  Other Topics Concern  . Not on file  Social History Narrative  . Not on file   Social Determinants of Health   Financial Resource Strain:   . Difficulty of Paying Living Expenses: Not on file  Food Insecurity:   . Worried About Programme researcher, broadcasting/film/video in the Last Year: Not on file  . Ran Out of Food in the Last Year: Not on file  Transportation Needs:   . Lack of Transportation (Medical): Not on file  . Lack of Transportation (Non-Medical): Not on file  Physical Activity:   . Days of Exercise per Week: Not on file  . Minutes of Exercise per Session: Not on file  Stress:   . Feeling of Stress : Not on file  Social Connections:   . Frequency of Communication with Friends and Family: Not on file  . Frequency of Social Gatherings with Friends and Family:  Not on file  . Attends Religious Services: Not on file  . Active Member of Clubs or Organizations: Not on file  . Attends Banker Meetings: Not on file  . Marital Status: Not on file  Intimate Partner Violence:   . Fear of Current or Ex-Partner: Not on file  . Emotionally Abused: Not on file  . Physically Abused: Not on file  . Sexually Abused: Not on file     Allergies  Allergen Reactions  . Fish Allergy Other (See Comments)    Scallops  Reaction: Syncope     Outpatient Medications Prior to Visit  Medication Sig Dispense Refill  . ALPRAZolam (XANAX) 1 MG tablet Take 0.25-1 mg by mouth at bedtime as needed for anxiety or sleep.     Marland Kitchen apixaban (ELIQUIS) 5 MG TABS tablet Take 1 tablet (5 mg total) by mouth 2 (two) times daily. 60 tablet 3  . clobetasol cream (TEMOVATE) 0.05 % APPLY TOPICALLY AT BEDTIME AS NEEDED FOR ITCHING  (Patient taking differently: Apply 1 application topically at bedtime as needed (Itching). ) 30 g 1  . diclofenac sodium (VOLTAREN) 1 % GEL Apply 3 gm to 3 large joints up to 3 times a day.Dispense 3 tubes with 3 refills. (Patient taking differently: Apply 3 g topically 3 (three) times daily as needed (Joint pain). ) 3 Tube 1  . diltiazem (CARDIZEM CD) 240 MG 24 hr capsule Take 1 capsule (240 mg total) by mouth daily. 90 capsule 3  . folic acid (FOLVITE) 1 MG tablet Take 2 tablets (2 mg total) by mouth daily. 180 tablet 2  . Methotrexate Sodium (METHOTREXATE, PF,) 250 MG/10ML injection INJECT 0.8ML INTO THE SKIN ONCE WEEKLY 10 mL 0  . metoprolol tartrate (LOPRESSOR) 25 MG tablet Take 1 tablet (25 mg total) by mouth as needed. For heart rate greater than 120 90 tablet 2  . Multiple Vitamin (MULTIVITAMIN PO) Take 1 tablet by mouth daily.     . Needles & Syringes MISC by Does not apply route.    . simvastatin (ZOCOR) 20 MG tablet Take 1 tablet by mouth daily.    Marland Kitchen VITAMIN D PO Take 1 tablet by mouth daily.      No facility-administered medications prior to visit.         Objective:   Physical Exam  Vitals:   06/13/20 1515  BP: 120/64  Pulse: 81  Temp: 98.2 F (36.8 C)  TempSrc: Temporal  SpO2: 96%  Weight: 151 lb 6.4 oz (68.7 kg)  Height: 5\' 4"  (1.626 m)   Gen: Pleasant, well-nourished, in no distress,  normal affect  ENT: No lesions,  mouth clear,  oropharynx clear, no postnasal drip  Neck: No JVD, no stridor  Lungs: No use of accessory muscles, bibasilar insp crackles and squeaks.   Cardiovascular: RRR, heart sounds normal, no murmur or gallops, no peripheral edema  Musculoskeletal: No deformities, no cyanosis or clubbing  Neuro: alert, awake, non focal  Skin: Warm, no lesions or rash      Assessment & Plan:  Abnormal CT of the chest Bilateral pulmonary infiltrates that were identified in early July and in the setting of apparent infectious syndrome, consider  bacterial pneumonia or viral pneumonia (RVP and COVID-19 testing negative).  These have not resolved on subsequent chest x-ray 05/26/2020 and I suspect that there is a noninfectious process, question due to rheumatoid lung, methotrexate.  She has crackles on exam and inspiratory squeaks that would be consistent with this.  I  suppose it is also possible that there could be a component of cardiogenic pulmonary edema since she has newly identified atrial fibrillation but I believe that this is less likely.  Depending on the CT results will need to consider any other possible evaluation including even bronchoscopy for culture data, biopsies.  If we believe that this is rheumatoid lung then she may merit a steroid course.  Will need to discuss with Dr. Link Snuffer  Snoring She may need a PSG at some point going forward.    Levy Pupa, MD, PhD 06/13/2020, 4:03 PM [facility] [phone] or if no answer [phone]

## 2020-06-13 NOTE — Patient Instructions (Signed)
We will repeat your CT scan of the chest to compare with July. Agree with staying off your methotrexate for now Depending on the CT results we will discuss other possible work-up to evaluate your persistent lung inflammation We can consider performing a sleep study at some point going forward to better evaluate your abnormal breathing pattern at night. Follow with Dr Delton Coombes next available after your CT to review.

## 2020-06-17 ENCOUNTER — Telehealth: Payer: Self-pay | Admitting: Cardiology

## 2020-06-17 NOTE — Telephone Encounter (Signed)
Spoke with IRHYTHM regarding monitor On 8/21 at 7:14 pm patient had 60 seconds of Rapid Afib/flutter with HR 184  13% Afib burden  Monitor uploaded in chart  Will forward to Dr Swaziland for review

## 2020-06-17 NOTE — Telephone Encounter (Signed)
Seen recently by [facility]. I did review her monitor. Franky Macho recently increased metoprolol - agree with this. Will need follow up in office.  Angelli Baruch [place] MD, [facility]

## 2020-06-17 NOTE — Telephone Encounter (Signed)
New kmessage:      Sheralyn Boatman from [facility] calling to report patient ZIO patch.

## 2020-06-18 NOTE — Telephone Encounter (Signed)
Spoke to patient follow up appointment scheduled with Dr.Jordan 9/30 at 8:20 am.

## 2020-06-19 ENCOUNTER — Other Ambulatory Visit: Payer: Self-pay

## 2020-06-19 ENCOUNTER — Ambulatory Visit (INDEPENDENT_AMBULATORY_CARE_PROVIDER_SITE_OTHER)
Admission: RE | Admit: 2020-06-19 | Discharge: 2020-06-19 | Disposition: A | Discharge status: Home / Self Care | Payer: Medicare Other | Source: Ambulatory Visit | Attending: Emergency Medicine | Admitting: Emergency Medicine

## 2020-06-19 DIAGNOSIS — R9389 Abnormal findings on diagnostic imaging of other specified body structures: Secondary | ICD-10-CM | POA: Diagnosis not present

## 2020-06-24 ENCOUNTER — Telehealth: Payer: Self-pay

## 2020-06-24 NOTE — Telephone Encounter (Signed)
 [facility] imaging called and wanted to let you know the CT result's.Dr.Byrum can you please advise. Thank you   IMPRESSION: 1. Resolution of much of the ground-glass that was present on the previous imaging study. 2. Subpleural reticulation remains with signs of septal thickening with diffuse involvement of both apices and lung bases. Question of early honeycombing at the lung bases and near the lung apices. Findings could be seen in the setting of interstitial lung disease. Based on previous imaging would consider fibrotic changes related to hypersensitivity pneumonitis or post inflammatory fibrosis. Pattern is indeterminate for UIP at this time. Pulmonary consultation is suggested. High-resolution chest CT may be helpful for further assessment. 3. Pulmonary nodules as outlined above, largest approximately 8 mm mean diameter Non-contrast chest CT at 3-6 months is recommended. If the nodules are stable at time of repeat CT, then future CT at 18-24 months (from today's scan) is considered optional for low-risk patients, but is recommended for high-risk patients. This recommendation follows the consensus statement: Guidelines for Management of Incidental Pulmonary Nodules Detected on CT Images: From the Fleischner Society 2017; Radiology 2017; 284:228-243. 4. Scattered calcified coronary artery disease in the LEFT coronary circulation. 5. Nodule in the LEFT breast unchanged from previous imaging approximately 1 cm, nonspecific on CT. Correlation with mammography may be helpful.   Electronically Signed   By: Donzetta Kohut M.D.   On: 06/19/2020 12:36

## 2020-06-26 ENCOUNTER — Other Ambulatory Visit: Payer: Self-pay | Admitting: Rheumatology

## 2020-06-26 MED ORDER — PREDNISONE 5 MG PO TABS: ORAL_TABLET | ORAL | 0 refills | Status: DC

## 2020-06-26 NOTE — Telephone Encounter (Signed)
Okay to give prednisone taper starting at 20 mg and taper by 5 mg every week.  I reviewed the CT scan of the chest for suspicion of ILD.  Please send a pulmonary referral.  We will have to discuss treatment options with the pulmonologist.

## 2020-06-26 NOTE — Telephone Encounter (Signed)
Patient called stating she is in a lot of pain specially in her wrists, hands, shoulders, legs and feet.  Patient states since stopping the Methotrexate the pain and swelling have increased to the point where she can barely walk or do anything.  Patient states she either needs to be seen or have a prescription of Prednisone sent to the pharmacy.  Please advise.

## 2020-06-26 NOTE — Telephone Encounter (Signed)
Patient states she already has an appointment with the pulmonologist.

## 2020-06-26 NOTE — Telephone Encounter (Signed)
Will plan to review with her at [facility]. CT suggestive of residual ILD, possibly due to her auto-immune disease. Suspect she will require FOB for cx data, could also consider lung bx and ILD clinic referral

## 2020-07-03 ENCOUNTER — Encounter (HOSPITAL_COMMUNITY): Payer: Self-pay | Admitting: Nurse Practitioner

## 2020-07-03 ENCOUNTER — Other Ambulatory Visit: Payer: Self-pay

## 2020-07-03 ENCOUNTER — Ambulatory Visit (HOSPITAL_COMMUNITY)
Admission: RE | Admit: 2020-07-03 | Discharge: 2020-07-03 | Disposition: A | Discharge status: Home / Self Care | Payer: Medicare Other | Source: Ambulatory Visit | Attending: Nurse Practitioner | Admitting: Nurse Practitioner

## 2020-07-03 VITALS — BP 138/74 | HR 64 | Ht 64.0 in | Wt 150.2 lb

## 2020-07-03 DIAGNOSIS — I480 Paroxysmal atrial fibrillation: Secondary | ICD-10-CM | POA: Diagnosis present

## 2020-07-03 DIAGNOSIS — D6869 Other thrombophilia: Secondary | ICD-10-CM | POA: Diagnosis not present

## 2020-07-03 DIAGNOSIS — Z7901 Long term (current) use of anticoagulants: Secondary | ICD-10-CM | POA: Insufficient documentation

## 2020-07-03 DIAGNOSIS — M069 Rheumatoid arthritis, unspecified: Secondary | ICD-10-CM | POA: Insufficient documentation

## 2020-07-03 DIAGNOSIS — Z79899 Other long term (current) drug therapy: Secondary | ICD-10-CM | POA: Insufficient documentation

## 2020-07-03 NOTE — Progress Notes (Signed)
Primary Care Physician: Burton Apley, MD Referring Physician:Luke Diona Fanti, [place] Cardiologist: Dr. [place]    Heather Koch is a 69 y.o. female with a h/o h/o rheumatoid arthritis on methotrexate, that was in [facility] in July for acute respiratory infection with hypoxia .An episode of afib  was noted during this hospitalization,short in duration and self converted.She was started on eliquis with a CHA2DS2VASc of 2, and cardizem for rate control.. She has a history of palpitations for years.  Recent monitor did show afib burden of 18%, no sustained afib. CT of chest showed possibility of ILD. She is pending an appointment with Dr. Delton Coombes 9/29. She recently had to go back on a prednisone taper for exacerbation of her rheumatoid arthritis.   She does not drink alcohol, no caffeine, no tobacco. Soft snores per her husband but no apnea. She currently feels her afib situation is quiet since Caredizem was increased. She felt afib more when wearing the monitor.   Today, she denies symptoms of palpitations, chest pain, shortness of breath, orthopnea, PND, lower extremity edema, dizziness, presyncope, syncope, or neurologic sequela. The patient is tolerating medications without difficulties and is otherwise without complaint today.   Past Medical History:  Diagnosis Date  . ASCUS (atypical squamous cells of undetermined significance) on Pap smear 12/2008   neg HR HPV  . LGSIL (low grade squamous intraepithelial dysplasia) 2005/2006  . Lichen sclerosus 2012  . Rheumatoid arthritis(714.0) 2012   Past Surgical History:  Procedure Laterality Date  . TUBAL LIGATION  1997    Current Outpatient Medications  Medication Sig Dispense Refill  . ALPRAZolam (XANAX) 1 MG tablet Take 0.25-1 mg by mouth at bedtime as needed for anxiety or sleep.     Marland Kitchen apixaban (ELIQUIS) 5 MG TABS tablet Take 1 tablet (5 mg total) by mouth 2 (two) times daily. 60 tablet 3  . clobetasol cream (TEMOVATE) 0.05 % APPLY TOPICALLY AT BEDTIME  AS NEEDED FOR ITCHING (Patient taking differently: Apply 1 application topically at bedtime as needed (Itching). ) 30 g 1  . diclofenac sodium (VOLTAREN) 1 % GEL Apply 3 gm to 3 large joints up to 3 times a day.Dispense 3 tubes with 3 refills. (Patient taking differently: Apply 3 g topically 3 (three) times daily as needed (Joint pain). ) 3 Tube 1  . diltiazem (CARDIZEM CD) 240 MG 24 hr capsule Take 1 capsule (240 mg total) by mouth daily. 90 capsule 3  . folic acid (FOLVITE) 1 MG tablet Take 2 tablets (2 mg total) by mouth daily. 180 tablet 2  . Methotrexate Sodium (METHOTREXATE, PF,) 250 MG/10ML injection INJECT 0.8ML INTO THE SKIN ONCE WEEKLY 10 mL 0  . metoprolol tartrate (LOPRESSOR) 25 MG tablet Take 1 tablet (25 mg total) by mouth as needed. For heart rate greater than 120 90 tablet 2  . Multiple Vitamin (MULTIVITAMIN PO) Take 1 tablet by mouth daily.     . Needles & Syringes MISC by Does not apply route.    . predniSONE (DELTASONE) 5 MG tablet Take 4 tabs po x 7 days, 3  tabs po x 7 days, 2  tabs po x 7 days, 1  tab po x 7 days 70 tablet 0  . simvastatin (ZOCOR) 20 MG tablet Take 1 tablet by mouth daily.    Marland Kitchen VITAMIN D PO Take 1 tablet by mouth daily.      No current facility-administered medications for this encounter.    Allergies  Allergen Reactions  . Fish Allergy Other (  See Comments)    Scallops  Reaction: Syncope    Social History   Socioeconomic History  . Marital status: Married    Spouse name: Not on file  . Number of children: Not on file  . Years of education: Not on file  . Highest education level: Not on file  Occupational History  . Not on file  Tobacco Use  . Smoking status: Never Smoker  . Smokeless tobacco: Never Used  Vaping Use  . Vaping Use: Never used  Substance and Sexual Activity  . Alcohol use: No  . Drug use: No  . Sexual activity: Yes    Partners: Male    Birth control/protection: Post-menopausal  Other Topics Concern  . Not on file    Social History Narrative  . Not on file   Social Determinants of Health   Financial Resource Strain:   . Difficulty of Paying Living Expenses: Not on file  Food Insecurity:   . Worried About Programme researcher, broadcasting/film/video in the Last Year: Not on file  . Ran Out of Food in the Last Year: Not on file  Transportation Needs:   . Lack of Transportation (Medical): Not on file  . Lack of Transportation (Non-Medical): Not on file  Physical Activity:   . Days of Exercise per Week: Not on file  . Minutes of Exercise per Session: Not on file  Stress:   . Feeling of Stress : Not on file  Social Connections:   . Frequency of Communication with Friends and Family: Not on file  . Frequency of Social Gatherings with Friends and Family: Not on file  . Attends Religious Services: Not on file  . Active Member of Clubs or Organizations: Not on file  . Attends Banker Meetings: Not on file  . Marital Status: Not on file  Intimate Partner Violence:   . Fear of Current or Ex-Partner: Not on file  . Emotionally Abused: Not on file  . Physically Abused: Not on file  . Sexually Abused: Not on file    Family History  Problem Relation Age of Onset  . Hypertension Mother   . Diabetes Mother   . Cancer Father        liver  . Heart disease Sister   . Cancer Brother        pancreatic cancer  . Cancer Maternal Aunt        throat  . Heart disease Sister   . Heart disease Sister   . Cancer Maternal Uncle        Colon  . Heart disease Brother        pacemaker  . Healthy Son   . Healthy Son   . Healthy Daughter     ROS- All systems are reviewed and negative except as per the HPI above  Physical Exam: Vitals:   07/03/20 0949  Weight: 68.1 kg  Height: 5\' 4"  (1.626 m)   Wt Readings from Last 3 Encounters:  07/03/20 68.1 kg  06/13/20 68.7 kg  05/21/20 67.4 kg    Labs: Lab Results  Component Value Date   NA 139 04/23/2020   K 4.4 04/23/2020   CL 102 04/23/2020   CO2 28 04/23/2020    GLUCOSE 141 (H) 04/23/2020   BUN 13 04/23/2020   CREATININE 0.63 04/23/2020   CALCIUM 9.2 04/23/2020   PHOS 4.0 04/23/2020   MG 2.3 04/23/2020   Lab Results  Component Value Date   INR 1.1 04/20/2020  Lab Results  Component Value Date   TRIG 65 04/19/2020     GEN- The patient is well appearing, alert and oriented x 3 today.   Head- normocephalic, atraumatic Eyes-  Sclera clear, conjunctiva pink Ears- hearing intact Oropharynx- clear Neck- supple, no JVP Lymph- no cervical lymphadenopathy Lungs- crackles bilaterally,  normal work of breathing Heart- Regular rate and rhythm, no murmurs, rubs or gallops, PMI not laterally displaced GI- soft, NT, ND, + BS Extremities- no clubbing, cyanosis, or edema MS- no significant deformity or atrophy Skin- no rash or lesion Psych- euthymic mood, full affect Neuro- strength and sensation are intact  EKG-NSR at 64 bpm, pr int 157 ms, qrs int 84 ms, qtc 429 ms    Echo- 1. Left ventricular ejection fraction, by estimation, is 50 to 55%. The left ventricle has low normal function. The left ventricle demonstrates global hypokinesis. Left ventricular diastolic parameters are indeterminate. 2. Right ventricular systolic function is normal. The right ventricular size is normal. Tricuspid regurgitation signal is inadequate for assessing PA pressure. 3. The mitral valve is grossly normal. Trivial mitral valve regurgitation. 4. The aortic valve is tricuspid. Aortic valve regurgitation is not visualized. 5. The inferior vena cava is normal in size with greater than 50% respiratory variability, suggesting right atrial pressure of 3 mmHg.   Assessment and Plan: 1. Paroxysmal afib General education re afib  Triggers discussed  We discussed antiarrythmic's but currently she is happy with her current afib burden  Discussed how to use as needed metoprolol  Amiodarone is not an option with CT lung scan showing possible ILD F checked with PharmD  and methotrexate is not contraindicated with Multaq, Flecainide or Tikosyn  Qtc of 429 ms is acceptable for multaq or tikosyn  However, am skeptical of using  flecainide as I see evidence of CAD on CT Tikosyn elective admissions are currently on hold 2/2 covid census  2. CHA2DS2VASc score of 2 Continue eliquis 5 mg bid   F/u with Dr. Swaziland 9/30, Dr. Delton Coombes 9/29  I will see in afib clinic as needed   Lupita Leash C. [facility] [address] [place], [place] [address] [phone]

## 2020-07-09 NOTE — Progress Notes (Signed)
Primary Care Physician: Burton Apley, MD Referring Physician:Luke Diona Fanti, [place] Cardiologist: Dr. [place]    Heather Koch is a 69 y.o. female with a  h/o rheumatoid arthritis on methotrexate. She was admitted  in July for acute respiratory infection with hypoxia. She had an episode of afib  during this hospitalization,short in duration and self converted.She was started on eliquis with a CHA2DS2VASc of 2, and cardizem for rate control.. She has a history of palpitations for years.  Event monitor did show afib burden of 18%, no sustained afib. CT of chest showed  of ILD. She was seen by Dr Delton Coombes yesterday who noted significant improvement in CT findings. She is back on steroids. He was going to discuss with Rheumatology the possibility of resuming methotrexate with close follow up.  She was seen in the Afib clinic in follow up and options for AAD therapy were discussed. Not a candidate for amiodarone due to ILD. Could consider Tikosyn or Multaq. Patient was Ok with current AFib burden and was managed with rate control and anticoagulation. Has coronary calcification noted on CT.   On follow up today she still has episodes of AFib about 2-3 times a week. Lasts less than 30 minutes. Has pulse ox and BP monitors but can't remember HR readings. No chest pain or SOB. One time took anxiety pill and this helped. Has not used prn metoprolol. She is on statin but can't remember when lipids last checked.   Past Medical History:  Diagnosis Date   ASCUS (atypical squamous cells of undetermined significance) on Pap smear 12/2008   neg HR HPV   LGSIL (low grade squamous intraepithelial dysplasia) 2005/2006   Lichen sclerosus 2012   Rheumatoid arthritis(714.0) 2012   Past Surgical History:  Procedure Laterality Date   TUBAL LIGATION  1997    Current Outpatient Medications  Medication Sig Dispense Refill   ALPRAZolam (XANAX) 1 MG tablet Take 0.25-1 mg by mouth at bedtime as needed for anxiety or  sleep.      clobetasol cream (TEMOVATE) 0.05 % APPLY TOPICALLY AT BEDTIME AS NEEDED FOR ITCHING (Patient taking differently: Apply 1 application topically at bedtime as needed (Itching). ) 30 g 1   diclofenac sodium (VOLTAREN) 1 % GEL Apply 3 gm to 3 large joints up to 3 times a day.Dispense 3 tubes with 3 refills. (Patient taking differently: Apply 3 g topically 3 (three) times daily as needed (Joint pain). ) 3 Tube 1   diltiazem (CARDIZEM CD) 240 MG 24 hr capsule Take 1 capsule (240 mg total) by mouth daily. 90 capsule 3   ELIQUIS 5 MG TABS tablet TAKE 1 TABLET BY MOUTH  TWICE DAILY 180 tablet 3   folic acid (FOLVITE) 1 MG tablet Take 2 tablets (2 mg total) by mouth daily. 180 tablet 2   Methotrexate Sodium (METHOTREXATE, PF,) 250 MG/10ML injection INJECT 0.8ML INTO THE SKIN ONCE WEEKLY 10 mL 0   metoprolol tartrate (LOPRESSOR) 25 MG tablet Take 1 tablet (25 mg total) by mouth as needed. For heart rate greater than 120 90 tablet 2   Multiple Vitamin (MULTIVITAMIN PO) Take 1 tablet by mouth daily.      Needles & Syringes MISC by Does not apply route.     predniSONE (DELTASONE) 5 MG tablet Take 4 tabs po x 7 days, 3  tabs po x 7 days, 2  tabs po x 7 days, 1  tab po x 7 days 70 tablet 0   simvastatin (ZOCOR) 20  MG tablet Take 1 tablet by mouth daily.     VITAMIN D PO Take 1 tablet by mouth daily.      No current facility-administered medications for this visit.    Allergies  Allergen Reactions   Fish Allergy Other (See Comments)    Scallops  Reaction: Syncope    Social History   Socioeconomic History   Marital status: Married    Spouse name: Not on file   Number of children: Not on file   Years of education: Not on file   Highest education level: Not on file  Occupational History   Not on file  Tobacco Use   Smoking status: Never Smoker   Smokeless tobacco: Never Used  Vaping Use   Vaping Use: Never used  Substance and Sexual Activity   Alcohol use: No    Drug use: No   Sexual activity: Yes    Partners: Male    Birth control/protection: Post-menopausal  Other Topics Concern   Not on file  Social History Narrative   Not on file   Social Determinants of Health   Financial Resource Strain:    Difficulty of Paying Living Expenses: Not on file  Food Insecurity:    Worried About Running Out of Food in the Last Year: Not on file   [employer] of Food in the Last Year: Not on file  Transportation Needs:    Lack of Transportation (Medical): Not on file   Lack of Transportation (Non-Medical): Not on file  Physical Activity:    Days of Exercise per Week: Not on file   Minutes of Exercise per Session: Not on file  Stress:    Feeling of Stress : Not on file  Social Connections:    Frequency of Communication with Friends and Family: Not on file   Frequency of Social Gatherings with Friends and Family: Not on file   Attends Religious Services: Not on file   Active Member of Clubs or Organizations: Not on file   Attends Banker Meetings: Not on file   Marital Status: Not on file  Intimate Partner Violence:    Fear of Current or Ex-Partner: Not on file   Emotionally Abused: Not on file   Physically Abused: Not on file   Sexually Abused: Not on file    Family History  Problem Relation Age of Onset   Hypertension Mother    Diabetes Mother    Cancer Father        liver   Heart disease Sister    Cancer Brother        pancreatic cancer   Cancer Maternal Aunt        throat   Heart disease Sister    Heart disease Sister    Cancer Maternal Uncle        Colon   Heart disease Brother        pacemaker   Healthy Son    Healthy Son    Healthy Daughter     ROS- All systems are reviewed and negative except as per the HPI above  Physical Exam: Vitals:   07/17/20 0831  BP: 130/82  Pulse: 68  Weight: 151 lb (68.5 kg)  Height: 5\' 4"  (1.626 m)   Wt Readings from Last 3 Encounters:  07/17/20  151 lb (68.5 kg)  07/16/20 151 lb 12.8 oz (68.9 kg)  07/03/20 150 lb 3.2 oz (68.1 kg)    Labs: Lab Results  Component Value Date   NA 139  04/23/2020   K 4.4 04/23/2020   CL 102 04/23/2020   CO2 28 04/23/2020   GLUCOSE 141 (H) 04/23/2020   BUN 13 04/23/2020   CREATININE 0.63 04/23/2020   CALCIUM 9.2 04/23/2020   PHOS 4.0 04/23/2020   MG 2.3 04/23/2020   Lab Results  Component Value Date   INR 1.1 04/20/2020   Lab Results  Component Value Date   TRIG 65 04/19/2020   BP 130/82    Pulse 68    Ht 5\' 4"  (1.626 m)    Wt 151 lb (68.5 kg)    LMP 03/31/2003    BMI 25.92 kg/m    GEN- The patient is well appearing, alert and oriented x 3 today.   Head- normocephalic, atraumatic Eyes-  Sclera clear, conjunctiva pink Ears- hearing intact Oropharynx- clear Neck- supple, no JVP Lymph- no cervical lymphadenopathy Lungs- crackles bilaterally,  normal work of breathing Heart- Regular rate and rhythm, no murmurs, rubs or gallops, PMI not laterally displaced GI- soft, NT, ND, + BS Extremities- no clubbing, cyanosis, or edema MS- no significant deformity or atrophy Skin- no rash or lesion Psych- euthymic mood, full affect Neuro- strength and sensation are intact   Echo7/5/21  1. Left ventricular ejection fraction, by estimation, is 50 to 55%. The left ventricle has low normal function. The left ventricle demonstrates global hypokinesis. Left ventricular diastolic parameters are indeterminate. 2. Right ventricular systolic function is normal. The right ventricular size is normal. Tricuspid regurgitation signal is inadequate for assessing PA pressure. 3. The mitral valve is grossly normal. Trivial mitral valve regurgitation. 4. The aortic valve is tricuspid. Aortic valve regurgitation is not visualized. 5. The inferior vena cava is normal in size with greater than 50% respiratory variability, suggesting right atrial pressure of 3 mmHg.  Event monitor 06/17/20: Study  Highlights   Normal sinus rhythm  Paroxysmal AFib with RVR- average HR in Afib 121 bpm. AFib burden of 13%  Rare PVCs and PACs.  Patient symptoms correlate with Afib  Assessment and Plan: 1. Paroxysmal afib- on rate control with Cardizem CD and prn metoprolol.  On Eliquis for anticoagulation Currently OK with burden of Afib symptoms. If these worsen over time could consider AAD options.    2. CHA2DS2VASc score of 2 Continue eliquis 5 mg bid   3. RA- per rheumatology.  4. Multilobar PNA- atypical- resolved.   5. ILD per Dr Delton Coombes  6. Coronary calcification. Noted primarily in mid LAD. No angina. She is on statin therapy but need to check lipids. Will add to next lab draw.   Follow up in 3 months.   Sheamus Hasting [place] MD, [facility]

## 2020-07-16 ENCOUNTER — Other Ambulatory Visit: Payer: Self-pay | Admitting: Cardiology

## 2020-07-16 ENCOUNTER — Ambulatory Visit (INDEPENDENT_AMBULATORY_CARE_PROVIDER_SITE_OTHER): Payer: Medicare Other | Admitting: Emergency Medicine

## 2020-07-16 ENCOUNTER — Other Ambulatory Visit: Payer: Self-pay

## 2020-07-16 ENCOUNTER — Encounter: Payer: Self-pay | Admitting: Emergency Medicine

## 2020-07-16 DIAGNOSIS — J849 Interstitial pulmonary disease, unspecified: Secondary | ICD-10-CM | POA: Insufficient documentation

## 2020-07-16 NOTE — Assessment & Plan Note (Addendum)
Reviewed her CT scans with her and her husband today.  Large-scale resolution of the groundglass infiltrates with some interstitial change, peripheral scarring that remains evident.  This may be due to rheumatoid, may be due to methotrexate.  In retrospect her hospitalization in July may have been largely a rheumatoid pneumonitis as opposed to bacterial pneumonia.  I think would be reasonable for her to restart methotrexate to see if she tolerates from a lung standpoint.  We would have to do serial imaging surveillance to determine this.  I will communicate with rheumatology going forward.  30 minutes of a 40-minute visit were spent reviewing the films, discussing the relevance and explaining the details, clinical importance of her CT results, interstitial lung disease.  Your CT scan of the chest shows large-scale resolution of the inflammation that was present when you were admitted to the hospital. There is still some residual interstitial change and probable scar.  This may be related to her rheumatoid arthritis, may also be related to your history of methotrexate use. Dr. Delton Coombes will discuss your chest findings with Dr. Corliss Skains with rheumatology to assist with medication going forward including whether it is okay to stay on methotrexate. We will likely need to repeat your imaging intermittently to ensure that your interstitial lung changes are not progressing. Follow with Dr Delton Coombes in 1 month or next available.

## 2020-07-16 NOTE — Patient Instructions (Signed)
Your CT scan of the chest shows large-scale resolution of the inflammation that was present when you were admitted to the hospital. There is still some residual interstitial change and probable scar.  This may be related to her rheumatoid arthritis, may also be related to your history of methotrexate use. Dr. Delton Coombes will discuss your chest findings with Dr. Corliss Skains with rheumatology to assist with medication going forward including whether it is okay to stay on methotrexate. We will likely need to repeat your imaging intermittently to ensure that your interstitial lung changes are not progressing. Follow with Dr Delton Coombes in 1 month or next available.

## 2020-07-16 NOTE — Progress Notes (Signed)
Subjective:    Patient ID: Heather Koch, female    DOB: 1951/07/13, 69 y.o.   MRN: [id number]  HPI 69 year old woman, never smoker, with a history of rheumatoid arthritis on long term methotrexate, new observation atrial fibrillation on recent hospitalization.  In June was treated for suspected bronchitis as an outpt w abx - "felt bad", low grade fever.  Admitted 04/18/2020 with fever, generalized body aches, dyspnea and nonproductive cough.  Her COVID-19 testing was negative, RVP negative.  A CT scan of the chest was done on 04/19/2020 which I have reviewed, showed no pulmonary embolism, multilobar patchy groundglass pulmonary infiltrates with small bilateral pleural effusions.  She was treated with azithromycin and ceftriaxone with slow clinical improvement.  She had hypoxemia that was slow to resolve.  As above she was found to have atrial fibrillation at the end of the hospitalization. Her MTX has been held since June due to this illness.   ROV 07/16/20 --this is a follow-up visit for 69 year old never smoker with RA on methotrexate.  She was recently admitted with a respiratory syndrome and CT chest showed patchy bilateral pulmonary infiltrates and small effusions.  She was treated empirically for CAP, had atrial fibrillation at the end of that hospitalization.  Her infiltrates persisted on chest x-ray and we performed a repeat CT scan of the chest on 06/19/2020 which I have reviewed, shows almost full resolution of the groundglass infiltrate that was noted on her previous imaging.  There is some residual subpleural reticulation and septal thickening without bronchiectasis.  Note was made of an 8 x 6 mm left lung base nodule, unchanged 9 mm lingular nodule She was just started on a prednisone taper for flaring arthritic pain - joints have responded well   Review of Systems As per HPI  Past Medical History:  Diagnosis Date  . ASCUS (atypical squamous cells of undetermined significance) on Pap smear  12/2008   neg HR HPV  . LGSIL (low grade squamous intraepithelial dysplasia) 2005/2006  . Lichen sclerosus 2012  . Rheumatoid arthritis(714.0) 2012     Family History  Problem Relation Age of Onset  . Hypertension Mother   . Diabetes Mother   . Cancer Father        liver  . Heart disease Sister   . Cancer Brother        pancreatic cancer  . Cancer Maternal Aunt        throat  . Heart disease Sister   . Heart disease Sister   . Cancer Maternal Uncle        Colon  . Heart disease Brother        pacemaker  . Healthy Son   . Healthy Son   . Healthy Daughter      Social History   Socioeconomic History  . Marital status: Married    Spouse name: Not on file  . Number of children: Not on file  . Years of education: Not on file  . Highest education level: Not on file  Occupational History  . Not on file  Tobacco Use  . Smoking status: Never Smoker  . Smokeless tobacco: Never Used  Vaping Use  . Vaping Use: Never used  Substance and Sexual Activity  . Alcohol use: No  . Drug use: No  . Sexual activity: Yes    Partners: Male    Birth control/protection: Post-menopausal  Other Topics Concern  . Not on file  Social History Narrative  . Not on file  Social Determinants of Health   Financial Resource Strain:   . Difficulty of Paying Living Expenses: Not on file  Food Insecurity:   . Worried About Programme researcher, broadcasting/film/video in the Last Year: Not on file  . Ran Out of Food in the Last Year: Not on file  Transportation Needs:   . Lack of Transportation (Medical): Not on file  . Lack of Transportation (Non-Medical): Not on file  Physical Activity:   . Days of Exercise per Week: Not on file  . Minutes of Exercise per Session: Not on file  Stress:   . Feeling of Stress : Not on file  Social Connections:   . Frequency of Communication with Friends and Family: Not on file  . Frequency of Social Gatherings with Friends and Family: Not on file  . Attends Religious Services:  Not on file  . Active Member of Clubs or Organizations: Not on file  . Attends Banker Meetings: Not on file  . Marital Status: Not on file  Intimate Partner Violence:   . Fear of Current or Ex-Partner: Not on file  . Emotionally Abused: Not on file  . Physically Abused: Not on file  . Sexually Abused: Not on file     Allergies  Allergen Reactions  . Fish Allergy Other (See Comments)    S[place]ops  Reaction: Syncope     Outpatient Medications Prior to Visit  Medication Sig Dispense Refill  . ALPRAZolam (XANAX) 1 MG tablet Take 0.25-1 mg by mouth at bedtime as needed for anxiety or sleep.     . clobetasol cream (TEMOVATE) 0.05 % APPLY TOPICALLY AT BEDTIME AS NEEDED FOR ITCHING (Patient taking differently: Apply 1 application topically at bedtime as needed (Itching). ) 30 g 1  . diclofenac sodium (VOLTAREN) 1 % GEL Apply 3 gm to 3 large joints up to 3 times a day.Dispense 3 tubes with 3 refills. (Patient taking differently: Apply 3 g topically 3 (three) times daily as needed (Joint pain). ) 3 Tube 1  . diltiazem (CARDIZEM CD) 240 MG 24 hr capsule Take 1 capsule (240 mg total) by mouth daily. 90 capsule 3  . ELIQUIS 5 MG TABS tablet TAKE 1 TABLET BY MOUTH  TWICE DAILY 180 tablet 3  . folic acid (FOLVITE) 1 MG tablet Take 2 tablets (2 mg total) by mouth daily. 180 tablet 2  . Methotrexate Sodium (METHOTREXATE, PF,) 250 MG/10ML injection INJECT 0.8ML INTO THE SKIN ONCE WEEKLY 10 mL 0  . metoprolol tartrate (LOPRESSOR) 25 MG tablet Take 1 tablet (25 mg total) by mouth as needed. For heart rate greater than 120 90 tablet 2  . Multiple Vitamin (MULTIVITAMIN PO) Take 1 tablet by mouth daily.     . Needles & Syringes MISC by Does not apply route.    . predniSONE (DELTASONE) 5 MG tablet Take 4 tabs po x 7 days, 3  tabs po x 7 days, 2  tabs po x 7 days, 1  tab po x 7 days 70 tablet 0  . simvastatin (ZOCOR) 20 MG tablet Take 1 tablet by mouth daily.    Marland Kitchen VITAMIN D PO Take 1 tablet by  mouth daily.      No facility-administered medications prior to visit.         Objective:   Physical Exam  Vitals:   07/16/20 1428  BP: 124/68  Pulse: 67  Temp: 98.2 F (36.8 C)  TempSrc: Temporal  SpO2: 99%  Weight: 151 lb 12.8 oz (  68.9 kg)  Height: 5\' 4"  (1.626 m)   Gen: Pleasant, well-nourished, in no distress,  normal affect  ENT: No lesions,  mouth clear,  oropharynx clear, no postnasal drip  Neck: No JVD, no stridor  Lungs: No use of accessory muscles, bibasilar insp crackles and squeaks.   Cardiovascular: RRR, heart sounds normal, no murmur or gallops, no peripheral edema  Musculoskeletal: No deformities, no cyanosis or clubbing  Neuro: alert, awake, non focal  Skin: Warm, no lesions or rash      Assessment & Plan:  No problem-specific Assessment & Plan notes found for this encounter.   , MD, PhD 07/16/2020, 2:34 PM [facility] [phone] or if no answer [phone]

## 2020-07-17 ENCOUNTER — Ambulatory Visit (INDEPENDENT_AMBULATORY_CARE_PROVIDER_SITE_OTHER): Payer: Medicare Other | Admitting: Cardiology

## 2020-07-17 ENCOUNTER — Encounter: Payer: Self-pay | Admitting: Cardiology

## 2020-07-17 VITALS — BP 130/82 | HR 68 | Ht 64.0 in | Wt 151.0 lb

## 2020-07-17 DIAGNOSIS — I2510 Atherosclerotic heart disease of native coronary artery without angina pectoris: Secondary | ICD-10-CM | POA: Diagnosis not present

## 2020-07-17 DIAGNOSIS — J849 Interstitial pulmonary disease, unspecified: Secondary | ICD-10-CM | POA: Diagnosis not present

## 2020-07-17 DIAGNOSIS — E878 Other disorders of electrolyte and fluid balance, not elsewhere classified: Secondary | ICD-10-CM

## 2020-07-17 DIAGNOSIS — M0579 Rheumatoid arthritis with rheumatoid factor of multiple sites without organ or systems involvement: Secondary | ICD-10-CM | POA: Diagnosis not present

## 2020-07-17 DIAGNOSIS — I2584 Coronary atherosclerosis due to calcified coronary lesion: Secondary | ICD-10-CM

## 2020-07-17 DIAGNOSIS — I480 Paroxysmal atrial fibrillation: Secondary | ICD-10-CM

## 2020-07-22 NOTE — Progress Notes (Signed)
Office Visit Note  Patient: Heather Koch             Date of Birth: Dec 28, 1950           MRN: [id number]             PCP: Burton Apley, MD Referring: Burton Apley, MD Visit Date: 08/05/2020 Occupation: @GUAROCC @  Subjective:  Other (recent hospitialization for pneumonia. patient has seen pulmonary and had a CT scan )   History of Present Illness: BEATRIS Koch is a 69 y.o. female with history of seropositive rheumatoid arthritis.  She states in June 2021 she started feeling congestion.  She was treated with for bronchitis several times with antibiotics and the symptoms got worse.  She was evaluated in the emergency room and was diagnosed with pneumonia.  She states she was treated with antibiotics and prednisone.  Initially she was suspected to have pneumonia and then ILD.  She also had evaluation for tachycardia and was seen by cardiologist.  She was diagnosed with atrial fibrillation and was a started on anticoagulation.  She has been under care of Dr. July 2021.  Who suspected methotrexate toxicity versus ILD.  She was advised that she may go back on methotrexate.  Her repeat CT scan showed improvement.  Patient states she has been off methotrexate since June 2021.  She has been off prednisone for a week now.  She states while she was on prednisone she did really well.  Now she has been having discomfort in her neck, elbows, right shoulder, hands and feet.  Activities of Daily Living:  Patient reports morning stiffness for 30  minutes.   Patient Reports nocturnal pain.  Difficulty dressing/grooming: Denies Difficulty climbing stairs: Denies Difficulty getting out of chair: Denies Difficulty using hands for taps, buttons, cutlery, and/or writing: Reports  Review of Systems  Constitutional: Positive for fatigue.  HENT: Negative for mouth sores, mouth dryness and nose dryness.   Eyes: Negative for pain, itching and dryness.  Respiratory: Negative for shortness of breath and difficulty  breathing.   Cardiovascular: Positive for palpitations. Negative for chest pain.  Gastrointestinal: Negative for blood in stool, constipation and diarrhea.  Endocrine: Negative for increased urination.  Genitourinary: Negative for difficulty urinating and painful urination.  Musculoskeletal: Positive for arthralgias, joint pain, myalgias, morning stiffness and myalgias. Negative for joint swelling and muscle tenderness.  Skin: Negative for color change, rash and redness.  Allergic/Immunologic: Positive for susceptible to infections.  Neurological: Positive for dizziness and headaches. Negative for numbness, memory loss and weakness.  Hematological: Positive for bruising/bleeding tendency.  Psychiatric/Behavioral: Negative for confusion and sleep disturbance.    PMFS History:  Patient Active Problem List   Diagnosis Date Noted  . ILD (interstitial lung disease) (HCC) 07/16/2020  . Abnormal CT of the chest 06/13/2020  . Snoring 06/13/2020  . Anticoagulated 05/21/2020  . Paroxysmal atrial fibrillation (HCC)   . Atypical pneumonia 04/19/2020  . Acute respiratory failure with hypoxia (HCC) 04/19/2020  . Abnormal lung sounds 04/12/2017  . Rheumatoid arthritis involving multiple sites with positive rheumatoid factor (HCC) 04/07/2017  . High risk medication use 04/07/2017  . Primary osteoarthritis of both hands 04/07/2017  . Vitamin D deficiency 04/07/2017  . History of Granuloma annulare 04/07/2017  . Osteopenia of multiple sites 04/07/2017  . ANXIETY 01/31/2008  . CONTACT DERMATITIS&OTHER ECZEMA DUE TO PLANTS 01/31/2008  . ALLERGIC RHINITIS 06/29/2007  . COLONIC POLYPS, HX OF 06/29/2007    Past Medical History:  Diagnosis Date  .  ASCUS (atypical squamous cells of undetermined significance) on Pap smear 12/2008   neg HR HPV  . LGSIL (low grade squamous intraepithelial dysplasia) 2005/2006  . Lichen sclerosus 2012  . Rheumatoid arthritis(714.0) 2012    Family History  Problem  Relation Age of Onset  . Hypertension Mother   . Diabetes Mother   . Cancer Father        liver  . Heart disease Sister   . Aneurysm Sister   . Cancer Brother        pancreatic cancer  . Cancer Maternal Aunt        throat  . Heart disease Sister   . Heart disease Sister   . Cancer Maternal Uncle        Colon  . Heart disease Brother        pacemaker  . Healthy Son   . Healthy Son   . Healthy Daughter    Past Surgical History:  Procedure Laterality Date  . TUBAL LIGATION  1997   Social History   Social History Narrative  . Not on file   Immunization History  Administered Date(s) Administered  . PFIZER SARS-COV-2 Vaccination 12/10/2019, 12/31/2019     Objective: Vital Signs: BP 126/71 (BP Location: Left Arm, Patient Position: Sitting, Cuff Size: Normal)   Pulse 66   Resp 14   Ht  (1.626 m)   Wt 153 lb 9.6 oz (69.7 kg)   LMP 03/31/2003   BMI 26.37 kg/m    Physical Exam Vitals and nursing note reviewed.  Constitutional:      Appearance: She is well-developed.  HENT:     Head: Normocephalic and atraumatic.  Eyes:     Conjunctiva/sclera: Conjunctivae normal.  Cardiovascular:     Rate and Rhythm: Normal rate and regular rhythm.     Heart sounds: Normal heart sounds.  Pulmonary:     Effort: Pulmonary effort is normal.     Breath sounds: Normal breath sounds.     Comments: Crackles in bilateral lung bases Abdominal:     General: Bowel sounds are normal.     Palpations: Abdomen is soft.  Musculoskeletal:     Cervical back: Normal range of motion.  Lymphadenopathy:     Cervical: No cervical adenopathy.  Skin:    General: Skin is warm and dry.     Capillary Refill: Capillary refill takes less than 2 seconds.  Neurological:     Mental Status: She is alert and oriented to person, place, and time.  Psychiatric:        Behavior: Behavior normal.      Musculoskeletal Exam: She has some discomfort range of motion of her cervical spine.  Shoulder joints  with good range of motion.  Elbow joints and wrist joints with good range of motion.  She synovial thickening over MCP but no synovitis was noted.  Hip joints, knee joints were in good range of motion.  She has mild tenderness over MTPs but no synovitis was noted.  CDAI Exam: CDAI Score: 1.8  Patient Global: 4 mm; Provider Global: 4 mm Swollen: 0 ; Tender: 7  Joint Exam 08/05/2020      Right  Left  MCP 3   Tender     Cervical Spine   Tender     MTP 1   Tender     MTP 2   Tender   Tender  MTP 3   Tender   Tender     Investigation: No additional findings.  Imaging: No results found.  Recent Labs: Lab Results  Component Value Date   WBC 6.9 04/23/2020   HGB 10.4 (L) 04/23/2020   PLT 339 04/23/2020   NA 139 04/23/2020   K 4.4 04/23/2020   CL 102 04/23/2020   CO2 28 04/23/2020   GLUCOSE 141 (H) 04/23/2020   BUN 13 04/23/2020   CREATININE 0.63 04/23/2020   BILITOT 0.2 (L) 04/23/2020   ALKPHOS 87 04/23/2020   AST 26 04/23/2020   ALT 31 04/23/2020   PROT 6.6 04/23/2020   ALBUMIN 2.3 (L) 04/23/2020   CALCIUM 9.2 04/23/2020   GFRAA >60 04/23/2020    Speciality Comments: No specialty comments available.  Procedures:  No procedures performed Allergies: Fish allergy   Assessment / Plan:     Visit Diagnoses: Rheumatoid arthritis involving multiple sites with positive rheumatoid factor (HCC) - erosive disease with +CCP +RF -ANA -she has been on methotrexate for many years.  She declined anti-TNF's in the past due to the cost.  She has been off methotrexate since June when she developed fever and bronchitis.  She was evaluated by Dr. Delton Coombes with possibility of ILD.  Methotrexate toxicity was also discussed.  She has been off methotrexate since June.  She did quite well while she was on prednisone.  We had detailed discussion regarding different treatment options and their side effects.  I discussed possible use of Imuran.  Indications adverse contraindications were discussed at  length.  Patient was in agreement.  We'll obtain blood work today.  If her TPMT is normal then we will start her on Imuran.  Have also advised her to discuss the plan with Dr. Delton Coombes in take his opinion.  Patient wants [place] to get a lipid panel per request of her PCP.  Plan: Lipid panel  High risk medication use - Methotrexate 0.8 ml every 7 days, and folic acid 1 mg 2 tablets daily. D/c SSZ in March 2021-Rx shortage and oral ulcers (she did not want any Biologics due to the cost.)- Plan: CBC with Differential/Platelet, COMPLETE METABOLIC PANEL WITH GFR, Sedimentation rate, Thiopurine methyltransferase(tpmt)rbc, Lipid panel, Hepatitis B core antibody, IgM, Hepatitis B surface antigen, Hepatitis C antibody, QuantiFERON-TB Gold Plus, Serum protein electrophoresis with reflex, IgG, IgA, IgM  Rheumatoid nodulosis (HCC) - Resolved   ILD (interstitial lung disease) (HCC)-I reviewed the CT scan with contrast with the patient.  I don't see high-resolution CT.  Patient's husband is against getting more CT scans.  I'm uncertain about the diagnosis at this point.  Although reviewing the notes and the CT scan reports I believe she is ILD.  I would like Dr. Kavin Leech input.  Primary osteoarthritis of both hands-joint protection muscle strengthening was discussed.  Pes anserine bursitis - Resolved   Trochanteric bursitis, right hip - Resolved.   Age-related osteoporosis without current pathological fracture - DEXA 08/17/2019 1/3 left distal radius BMD 0.501 with T score -3.2.  -11% change in BMD of distal radius and -6% change in left FN. previous DEXA in 2017.  She does not want to take any medication for osteoporosis.  History of vitamin D deficiency  History of colon polyps  History of anxiety  Granuloma annulare  Educated about COVID-19 virus infection-she is fully vaccinated against COVID-19.  She needs a booster.  Use of mask, social distancing and hand hygiene was discussed and instructions were placed  in the AVS.  Orders: Orders Placed This Encounter  Procedures  . CBC with Differential/Platelet  . COMPLETE METABOLIC PANEL WITH  GFR  . Sedimentation rate  . Thiopurine methyltransferase(tpmt)rbc  . Lipid panel  . Hepatitis B core antibody, IgM  . Hepatitis B surface antigen  . Hepatitis C antibody  . QuantiFERON-TB Gold Plus  . Serum protein electrophoresis with reflex  . IgG, IgA, IgM   No orders of the defined types were placed in this encounter.  .  Follow-Up Instructions: Return in about 6 weeks (around 09/16/2020) for Rheumatoid arthritis.   Pollyann Savoy, MD  Note - This record has been created using Animal nutritionist.  Chart creation errors have been sought, but may not always  have been located. Such creation errors do not reflect on  the standard of medical care.

## 2020-08-05 ENCOUNTER — Other Ambulatory Visit: Payer: Self-pay

## 2020-08-05 ENCOUNTER — Encounter: Payer: Self-pay | Admitting: Rheumatology

## 2020-08-05 ENCOUNTER — Ambulatory Visit: Payer: Medicare Other | Admitting: Rheumatology

## 2020-08-05 VITALS — BP 126/71 | HR 66 | Resp 14 | Ht 64.0 in | Wt 153.6 lb

## 2020-08-05 DIAGNOSIS — J849 Interstitial pulmonary disease, unspecified: Secondary | ICD-10-CM

## 2020-08-05 DIAGNOSIS — Z8639 Personal history of other endocrine, nutritional and metabolic disease: Secondary | ICD-10-CM

## 2020-08-05 DIAGNOSIS — M7050 Other bursitis of knee, unspecified knee: Secondary | ICD-10-CM

## 2020-08-05 DIAGNOSIS — M19041 Primary osteoarthritis, right hand: Secondary | ICD-10-CM

## 2020-08-05 DIAGNOSIS — M810 Age-related osteoporosis without current pathological fracture: Secondary | ICD-10-CM

## 2020-08-05 DIAGNOSIS — M0630 Rheumatoid nodule, unspecified site: Secondary | ICD-10-CM | POA: Diagnosis not present

## 2020-08-05 DIAGNOSIS — M19042 Primary osteoarthritis, left hand: Secondary | ICD-10-CM

## 2020-08-05 DIAGNOSIS — M0579 Rheumatoid arthritis with rheumatoid factor of multiple sites without organ or systems involvement: Secondary | ICD-10-CM

## 2020-08-05 DIAGNOSIS — Z8659 Personal history of other mental and behavioral disorders: Secondary | ICD-10-CM

## 2020-08-05 DIAGNOSIS — L920 Granuloma annulare: Secondary | ICD-10-CM

## 2020-08-05 DIAGNOSIS — M7061 Trochanteric bursitis, right hip: Secondary | ICD-10-CM

## 2020-08-05 DIAGNOSIS — Z79899 Other long term (current) drug therapy: Secondary | ICD-10-CM

## 2020-08-05 DIAGNOSIS — Z86010 Personal history of colonic polyps: Secondary | ICD-10-CM

## 2020-08-05 DIAGNOSIS — Z7189 Other specified counseling: Secondary | ICD-10-CM

## 2020-08-05 NOTE — Patient Instructions (Addendum)
Azathioprine tablets What is this medicine? AZATHIOPRINE (ay za THYE oh preen) suppresses the immune system. It is used to prevent organ rejection after a transplant. It is also used to treat rheumatoid arthritis. This medicine may be used for other purposes; ask your health care provider or pharmacist if you have questions. COMMON BRAND NAME(S): Azasan, Imuran What should I tell my health care provider before I take this medicine? They need to know if you have any of these conditions:  infection  kidney disease  liver disease  an unusual or allergic reaction to azathioprine, other medicines, lactose, foods, dyes, or preservatives  pregnant or trying to get pregnant  breast feeding How should I use this medicine? Take this medicine by mouth with a full glass of water. Follow the directions on the prescription label. Take your medicine at regular intervals. Do not take your medicine more often than directed. Continue to take your medicine even if you feel better. Do not stop taking except on your doctor's advice. Talk to your pediatrician regarding the use of this medicine in children. Special care may be needed. Overdosage: If you think you have taken too much of this medicine contact a poison control center or emergency room at once. NOTE: This medicine is only for you. Do not share this medicine with others. What if I miss a dose? If you miss a dose, take it as soon as you can. If it is almost time for your next dose, take only that dose. Do not take double or extra doses. What may interact with this medicine? Do not take this medicine with any of the following medications:  febuxostat  mercaptopurine This medicine may also interact with the following medications:  allopurinol  aminosalicylates like sulfasalazine, mesalamine, balsalazide, and olsalazine  leflunomide  medicines called ACE inhibitors like benazepril, captopril, enalapril, fosinopril, quinapril, lisinopril,  ramipril, and trandolapril  mycophenolate  sulfamethoxazole; trimethoprim  vaccines  warfarin This list may not describe all possible interactions. Give your health care provider a list of all the medicines, herbs, non-prescription drugs, or dietary supplements you use. Also tell them if you smoke, drink alcohol, or use illegal drugs. Some items may interact with your medicine. What should I watch for while using this medicine? Visit your doctor or health care professional for regular checks on your progress. You will need frequent blood checks during the first few months you are receiving the medicine. If you get a cold or other infection while receiving this medicine, call your doctor or health care professional. Do not treat yourself. The medicine may increase your risk of getting an infection. Women should inform their doctor if they wish to become pregnant or think they might be pregnant. There is a potential for serious side effects to an unborn child. Talk to your health care professional or pharmacist for more information. Men may have a reduced sperm count while they are taking this medicine. Talk to your health care professional for more information. This medicine may increase your risk of getting certain kinds of cancer. Talk to your doctor about healthy lifestyle choices, important screenings, and your risk. What side effects may I notice from receiving this medicine? Side effects that you should report to your doctor or health care professional as soon as possible:  allergic reactions like skin rash, itching or hives, swelling of the face, lips, or tongue  changes in vision  confusion  fever, chills, or any other sign of infection  loss of balance or coordination  severe stomach pain  unusual bleeding, bruising  unusually weak or tired  vomiting  yellowing of the eyes or skin Side effects that usually do not require medical attention (report to your doctor or health  care professional if they continue or are bothersome):  hair loss  nausea This list may not describe all possible side effects. Call your doctor for medical advice about side effects. You may report side effects to FDA at 1-800-FDA-1088. Where should I keep my medicine? Keep out of the reach of children. Store at room temperature between 15 and 25 degrees C (59 and 77 degrees F). Protect from light. Throw away any unused medicine after the expiration date. NOTE: This sheet is a summary. It may not cover all possible information. If you have questions about this medicine, talk to your doctor, pharmacist, or health care provider.  2020 Elsevier/Gold Standard (2014-01-29 12:00:31)   Standing Labs We placed an order today for your standing lab work.   Please have your standing labs drawn in 2 weeks after starting Imuran x2 and then every 2 months  If possible, please have your labs drawn 2 weeks prior to your appointment so that the provider can discuss your results at your appointment.  We have open lab daily Monday through Thursday from 8:30-12:30 PM and 1:30-4:30 PM and Friday from 8:30-12:30 PM and 1:30-4:00 PM at the office of Dr. Pollyann Savoy, [facility].   Please be advised, patients with office appointments requiring lab work will take precedents over walk-in lab work.  If possible, please come for your lab work on Monday and Friday afternoons, as you may experience shorter wait times. The office is located at [address], [place], [place] [address] No appointment is necessary.   Labs are drawn by [facility]. Please bring your co-pay at the time of your lab draw.  You may receive a bill from [facility] for your lab work.  If you wish to have your labs drawn at another location, please call the office 24 hours in advance to send orders.  If you have any questions regarding directions or hours of operation,  please call [phone].   As a reminder, please  drink plenty of water prior to coming for your lab work. Thanks!  COVID-19 vaccine recommendations:   COVID-19 vaccine is recommended for everyone (unless you are allergic to a vaccine component), even if you are on a medication that suppresses your immune system.   If you are on Methotrexate, Cellcept (mycophenolate), Rinvoq, Harriette Ohara, and Olumiant- hold the medication for 1 week after each vaccine. Hold Methotrexate for 2 weeks after the single dose COVID-19 vaccine.   If you are on Orencia subcutaneous injection - hold medication one week prior to and one week after the first COVID-19 vaccine dose (only).   If you are on Orencia IV infusions- time vaccination administration so that the first COVID-19 vaccination will occur four weeks after the infusion and postpone the subsequent infusion by one week.   If you are on Cyclophosphamide or Rituxan infusions please contact your doctor prior to receiving the COVID-19 vaccine.   Do not take Tylenol or any anti-inflammatory medications (NSAIDs) 24 hours prior to the COVID-19 vaccination.   There is no direct evidence about the efficacy of the COVID-19 vaccine in individuals who are on medications that suppress the immune system.   Even if you are fully vaccinated, and you are on any medications that suppress your immune system, please continue to wear a  mask, maintain at least six feet social distance and practice hand hygiene.   If you develop a COVID-19 infection, please contact your PCP or our office to determine if you need antibody infusion.  The booster vaccine is now available for immunocompromised patients. It is advised that if you had Pfizer vaccine you should get [employer] booster.  If you had a Moderna vaccine then you should get a Moderna booster. Johnson and Laural Benes does not have a booster vaccine at this time.  Please see the following web sites for updated information.    [email or web address]-Patient-Resources.[email or web address]

## 2020-08-05 NOTE — Telephone Encounter (Signed)
Pending lab results, patient will be starting imuran per Dr. Corliss Skains.   Consent obtained and sent to the scan center.

## 2020-08-06 ENCOUNTER — Other Ambulatory Visit: Payer: Self-pay

## 2020-08-06 DIAGNOSIS — Z79899 Other long term (current) drug therapy: Secondary | ICD-10-CM

## 2020-08-07 ENCOUNTER — Ambulatory Visit: Payer: Medicare Other | Admitting: Emergency Medicine

## 2020-08-07 ENCOUNTER — Other Ambulatory Visit: Payer: Self-pay

## 2020-08-07 NOTE — Telephone Encounter (Signed)
Her ESR is high. On my exam her RA was not very active. I want to make sure that her pneumonia is completely resolved before starting her on Imuran.  Is it okay to start her on immunosuppressive therapy?

## 2020-08-14 LAB — SEDIMENTATION RATE: Sed Rate: 63 mm/h — ABNORMAL HIGH (ref 0–30)

## 2020-08-14 LAB — COMPLETE METABOLIC PANEL WITH GFR
AG Ratio: 1.2 (calc) (ref 1.0–2.5)
ALT: 13 U/L (ref 6–29)
AST: 18 U/L (ref 10–35)
Albumin: 4 g/dL (ref 3.6–5.1)
Alkaline phosphatase (APISO): 70 U/L (ref 37–153)
BUN: 12 mg/dL (ref 7–25)
CO2: 32 mmol/L (ref 20–32)
Calcium: 9.7 mg/dL (ref 8.6–10.4)
Chloride: 105 mmol/L (ref 98–110)
Creat: 0.65 mg/dL (ref 0.50–0.99)
GFR, Est African American: 106 mL/min/{1.73_m2} (ref >=60)
GFR, Est Non African American: 91 mL/min/{1.73_m2} (ref >=60)
Globulin: 3.4 g/dL (calc) (ref 1.9–3.7)
Glucose, Bld: 88 mg/dL (ref 65–99)
Potassium: 4.4 mmol/L (ref 3.5–5.3)
Sodium: 140 mmol/L (ref 135–146)
Total Bilirubin: 0.4 mg/dL (ref 0.2–1.2)
Total Protein: 7.4 g/dL (ref 6.1–8.1)

## 2020-08-14 LAB — PROTEIN ELECTROPHORESIS, SERUM, WITH REFLEX
Albumin ELP: 3.8 g/dL (ref 3.8–4.8)
Alpha 1: 0.3 g/dL (ref 0.2–0.3)
Alpha 2: 0.9 g/dL (ref 0.5–0.9)
Beta 2: 0.4 g/dL (ref 0.2–0.5)
Beta Globulin: 0.5 g/dL (ref 0.4–0.6)
Gamma Globulin: 1.3 g/dL (ref 0.8–1.7)
Total Protein: 7.2 g/dL (ref 6.1–8.1)

## 2020-08-14 LAB — CBC WITH DIFFERENTIAL/PLATELET
Absolute Monocytes: 650 cells/uL (ref 200–950)
Basophils Absolute: 39 cells/uL (ref 0–200)
Basophils Relative: 0.6 %
Eosinophils Absolute: 150 cells/uL (ref 15–500)
Eosinophils Relative: 2.3 %
HCT: 41.5 % (ref 35.0–45.0)
Hemoglobin: 13.6 g/dL (ref 11.7–15.5)
Lymphs Abs: 2600 cells/uL (ref 850–3900)
MCH: 30.4 pg (ref 27.0–33.0)
MCHC: 32.8 g/dL (ref 32.0–36.0)
MCV: 92.6 fL (ref 80.0–100.0)
MPV: 12.4 fL (ref 7.5–12.5)
Monocytes Relative: 10 %
Neutro Abs: 3062 cells/uL (ref 1500–7800)
Neutrophils Relative %: 47.1 %
Platelets: 241 10*3/uL (ref 140–400)
RBC: 4.48 10*6/uL (ref 3.80–5.10)
RDW: 13.1 % (ref 11.0–15.0)
Total Lymphocyte: 40 %
WBC: 6.5 10*3/uL (ref 3.8–10.8)

## 2020-08-14 LAB — QUANTIFERON-TB GOLD PLUS
Mitogen-NIL: 9.69 IU/mL
NIL: 0.11 IU/mL
QuantiFERON-TB Gold Plus: NEGATIVE
TB1-NIL: <0 IU/mL
TB2-NIL: <0 IU/mL

## 2020-08-14 LAB — LIPID PANEL
Cholesterol: 191 mg/dL (ref <200)
HDL: 79 mg/dL (ref >=50)
LDL Cholesterol (Calc): 90 mg/dL (calc)
Non-HDL Cholesterol (Calc): 112 mg/dL (calc) (ref <130)
Total CHOL/HDL Ratio: 2.4 (calc) (ref <5.0)
Triglycerides: 120 mg/dL (ref <150)

## 2020-08-14 LAB — THIOPURINE METHYLTRANSFERASE (TPMT), RBC: Thiopurine Methyltransferase, RBC: 18 nmol/hr/mL RBC

## 2020-08-14 LAB — HEPATITIS C ANTIBODY
Hepatitis C Ab: NONREACTIVE
SIGNAL TO CUT-OFF: 0.01 (ref <1.00)

## 2020-08-14 LAB — IGG, IGA, IGM
IgG (Immunoglobin G), Serum: 1380 mg/dL (ref 600–1540)
IgM, Serum: 207 mg/dL (ref 50–300)
Immunoglobulin A: 283 mg/dL (ref 70–320)

## 2020-08-14 LAB — HEPATITIS B SURFACE ANTIGEN: Hepatitis B Surface Ag: NONREACTIVE

## 2020-08-14 LAB — HEPATITIS B CORE ANTIBODY, IGM: Hep B C IgM: NONREACTIVE

## 2020-08-18 NOTE — Telephone Encounter (Signed)
It's ok for her to go back on immunosuppression from my standpoint. Thanks.

## 2020-08-19 ENCOUNTER — Telehealth: Payer: Self-pay

## 2020-08-19 ENCOUNTER — Other Ambulatory Visit: Payer: Self-pay | Admitting: *Deleted

## 2020-08-19 DIAGNOSIS — M810 Age-related osteoporosis without current pathological fracture: Secondary | ICD-10-CM

## 2020-08-19 DIAGNOSIS — M0630 Rheumatoid nodule, unspecified site: Secondary | ICD-10-CM

## 2020-08-19 DIAGNOSIS — Z8639 Personal history of other endocrine, nutritional and metabolic disease: Secondary | ICD-10-CM

## 2020-08-19 DIAGNOSIS — M19042 Primary osteoarthritis, left hand: Secondary | ICD-10-CM

## 2020-08-19 DIAGNOSIS — M7050 Other bursitis of knee, unspecified knee: Secondary | ICD-10-CM

## 2020-08-19 DIAGNOSIS — M0579 Rheumatoid arthritis with rheumatoid factor of multiple sites without organ or systems involvement: Secondary | ICD-10-CM

## 2020-08-19 DIAGNOSIS — M7061 Trochanteric bursitis, right hip: Secondary | ICD-10-CM

## 2020-08-19 DIAGNOSIS — M8589 Other specified disorders of bone density and structure, multiple sites: Secondary | ICD-10-CM

## 2020-08-19 DIAGNOSIS — M19041 Primary osteoarthritis, right hand: Secondary | ICD-10-CM

## 2020-08-19 DIAGNOSIS — J849 Interstitial pulmonary disease, unspecified: Secondary | ICD-10-CM

## 2020-08-19 DIAGNOSIS — M542 Cervicalgia: Secondary | ICD-10-CM

## 2020-08-19 DIAGNOSIS — Z79899 Other long term (current) drug therapy: Secondary | ICD-10-CM

## 2020-08-19 DIAGNOSIS — G8929 Other chronic pain: Secondary | ICD-10-CM

## 2020-08-19 NOTE — Telephone Encounter (Signed)
Orders placed.

## 2020-08-19 NOTE — Telephone Encounter (Signed)
I would recommend referral to [facility] rheumatologist and pulmonologist for a second opinion as her disease seems complicated.

## 2020-08-19 NOTE — Telephone Encounter (Signed)
All labs have resulted.   Dose per Dr. Corliss Skains Imuran 50 mg po daily x 2 weeks, If labs are stable increase to 75 mg daily.

## 2020-08-19 NOTE — Telephone Encounter (Signed)
Reviewed lab results with patient. Patient states she was not happy with Dr. Delton Coombes. Patient states she showed up for her appointment on 08/07/2020. Patient states she was advised by his nurse that he would not see her that day because he was running behind due to being in meetings that day. Patient states he has not followed through with the things that he stated he was going to do. Patient states she feels he is not concerned. Patient states she would like to know who else you may recommend. Patient would like to know if you feel it would be safe for her to take the medications.  Please advise.

## 2020-08-19 NOTE — Telephone Encounter (Signed)
Patient requested a return call with the results of her labwork she had 2 weeks ago.

## 2020-08-19 NOTE — Telephone Encounter (Signed)
Thank you :)

## 2020-08-19 NOTE — Telephone Encounter (Signed)
I spoke with patient. She will not take Imuran at this point. She requests a urgent referral. Please make an urgent referral to [facility] pulmonologist and rheumatologist per patient's request.

## 2020-08-19 NOTE — Telephone Encounter (Signed)
Ok to start Imuran once all the labs are available.

## 2020-08-20 ENCOUNTER — Encounter: Payer: Self-pay | Admitting: Nurse Practitioner

## 2020-08-29 ENCOUNTER — Telehealth: Payer: Self-pay

## 2020-08-29 ENCOUNTER — Other Ambulatory Visit: Payer: Self-pay | Admitting: Cardiology

## 2020-08-29 MED ORDER — DILTIAZEM HCL ER COATED BEADS 240 MG PO CP24: 240.0000 mg | ORAL_CAPSULE | Freq: Every day | ORAL | 3 refills | Status: DC

## 2020-08-29 NOTE — Telephone Encounter (Signed)
° °  Primary Cardiologist: Peter Swaziland, MD  Chart reviewed as part of pre-operative protocol coverage. Simple dental extractions are considered low risk procedures per guidelines and generally do not require any specific cardiac clearance. It is also generally accepted that for simple extractions and dental cleanings, there is no need to interrupt blood thinner therapy.   SBE prophylaxis is not required for the patient.  I will route this recommendation to the requesting party via Epic fax function and remove from pre-op pool.  Please call with questions.  Ronney Asters, NP 08/29/2020, 10:49 AM

## 2020-08-29 NOTE — Telephone Encounter (Signed)
Dr. Fayne Norrie, Rheumatologist from [facility] called requesting a return call regarding patient.  Dr. Barbee Cough states he saw patient and had a few items he wanted to discuss with Dr. Corliss Skains.  Dr. Barbee Cough stated this is non-emergent.  Cell #[phone]

## 2020-08-29 NOTE — Telephone Encounter (Signed)
*  STAT* If patient is at the pharmacy, call can be transferred to refill team.   1. Which medications need to be refilled? (please list name of each medication and dose if known) diltiazem (CARDIZEM CD) 240 MG 24 hr capsule  2. Which pharmacy/location (including street and city if local pharmacy) is medication to be sent to? [facility] - [place], [place] - [address], [address]  3. Do they need a 30 day or 90 day supply? 90 day supply

## 2020-08-29 NOTE — Telephone Encounter (Signed)
   [facility] Pre-operative Risk Assessment    HEARTCARE STAFF: - Please ensure there is not already an duplicate clearance open for this procedure. - Under Visit Info/Reason for Call, type in Other and utilize the format Clearance MM/DD/YY or Clearance TBD. Do not use dashes or single digits. - If request is for dental extraction, please clarify the # of teeth to be extracted.  Request for surgical clearance:  1. What type of surgery is being performed? 1 FILLING   2. When is this surgery scheduled? TBD   3. What type of clearance is required (medical clearance vs. Pharmacy clearance to hold med vs. Both)? BOTH  4. Are there any medications that need to be held prior to surgery and how long? ELIQUIS   5. Practice name and name of physician performing surgery? [facility]   6. What is the office phone number? [phone]   7.   What is the office fax number? [phone]  8.   Anesthesia type (None, local, MAC, general) ? LOCAL

## 2020-08-29 NOTE — Telephone Encounter (Signed)
I returned call.  I spoke with Dr. Barbee Cough.  He explained that patient wanted only one-time visit and he would not be able to make a decision or do work-up with one-time visit.  I called patient back and advised her to [facility] and the [facility] so they can make a plan for future treatment once the plan is established and he can come back in follow-up in [place].  She voiced understanding.

## 2020-09-12 NOTE — Progress Notes (Deleted)
Office Visit Note  Patient: Heather Koch             Date of Birth: 01/13/1951           MRN: [id number]             PCP: Lorene Dy, MD Referring: Lorene Dy, MD Visit Date: 09/17/2020 Occupation: _0 @  Subjective:  No chief complaint on file.   History of Present Illness: Heather Koch is a 69 y.o. female ***   Activities of Daily Living:  Patient reports morning stiffness for *** {minute/hour:19697}.   Patient {ACTIONS;DENIES/REPORTS:21021675::"Denies"} nocturnal pain.  Difficulty dressing/grooming: {ACTIONS;DENIES/REPORTS:21021675::"Denies"} Difficulty climbing stairs: {ACTIONS;DENIES/REPORTS:21021675::"Denies"} Difficulty getting out of chair: {ACTIONS;DENIES/REPORTS:21021675::"Denies"} Difficulty using hands for taps, buttons, cutlery, and/or writing: {ACTIONS;DENIES/REPORTS:21021675::"Denies"}  No Rheumatology ROS completed.   PMFS History:  Patient Active Problem List   Diagnosis Date Noted  . ILD (interstitial lung disease) ([place]) 07/16/2020  . Abnormal CT of the chest 06/13/2020  . Snoring 06/13/2020  . Anticoagulated 05/21/2020  . Paroxysmal atrial fibrillation ([facility])   . Atypical pneumonia 04/19/2020  . Acute respiratory failure with hypoxia ([place]) 04/19/2020  . Abnormal lung sounds 04/12/2017  . Rheumatoid arthritis involving multiple sites with positive rheumatoid factor ([place]) 04/07/2017  . High risk medication use 04/07/2017  . Primary osteoarthritis of both hands 04/07/2017  . Vitamin D deficiency 04/07/2017  . History of Granuloma annulare 04/07/2017  . Osteopenia of multiple sites 04/07/2017  . ANXIETY 01/31/2008  . CONTACT DERMATITIS&OTHER ECZEMA DUE TO PLANTS 01/31/2008  . ALLERGIC RHINITIS 06/29/2007  . COLONIC POLYPS, HX OF 06/29/2007    Past Medical History:  Diagnosis Date  . ASCUS (atypical squamous cells of undetermined significance) on Pap smear 12/2008   neg HR HPV  . LGSIL (low grade squamous intraepithelial dysplasia)  2005/2006  . Lichen sclerosus 7035  . Rheumatoid arthritis(714.0) 2012    Family History  Problem Relation Age of Onset  . Hypertension Mother   . Diabetes Mother   . Cancer Father        liver  . Heart disease Sister   . Aneurysm Sister   . Cancer Brother        pancreatic cancer  . Cancer Maternal Aunt        throat  . Heart disease Sister   . Heart disease Sister   . Cancer Maternal Uncle        Colon  . Heart disease Brother        pacemaker  . Healthy Son   . Healthy Son   . Healthy Daughter    Past Surgical History:  Procedure Laterality Date  . TUBAL LIGATION  1997   Social History   Social History Narrative  . Not on file   Immunization History  Administered Date(s) Administered  . PFIZER SARS-COV-2 Vaccination 12/10/2019, 12/31/2019     Objective: Vital Signs: LMP 03/31/2003    Physical Exam   Musculoskeletal Exam: ***  CDAI Exam: CDAI Score: -- Patient Global: --; Provider Global: -- Swollen: --; Tender: -- Joint Exam 09/17/2020   No joint exam has been documented for this visit   There is currently no information documented on the homunculus. Go to the Rheumatology activity and complete the homunculus joint exam.  Investigation: No additional findings.  Imaging: No results found.  Recent Labs: Lab Results  Component Value Date   WBC 6.5 08/06/2020   HGB 13.6 08/06/2020   PLT 241 08/06/2020   NA 140 08/06/2020   K 4.4  08/06/2020   CL 105 08/06/2020   CO2 32 08/06/2020   GLUCOSE 88 08/06/2020   BUN 12 08/06/2020   CREATININE 0.65 08/06/2020   BILITOT 0.4 08/06/2020   ALKPHOS 87 04/23/2020   AST 18 08/06/2020   ALT 13 08/06/2020   PROT 7.4 08/06/2020   PROT 7.2 08/06/2020   ALBUMIN 2.3 (L) 04/23/2020   CALCIUM 9.7 08/06/2020   GFRAA 106 08/06/2020   QFTBGOLDPLUS NEGATIVE 08/06/2020   August 06, 2020 SPEP normal, immunoglobulins normal, TB Gold negative, hepatitis B-, hepatitis C negative, TPMT normal, lipid panel normal,  ESR 63  Speciality Comments: No specialty comments available.  Procedures:  No procedures performed Allergies: Fish allergy   Assessment / Plan:     Visit Diagnoses: No diagnosis found.  Orders: No orders of the defined types were placed in this encounter.  No orders of the defined types were placed in this encounter.   Face-to-face time spent with patient was *** minutes. Greater than 50% of time was spent in counseling and coordination of care.  Follow-Up Instructions: No follow-ups on file.   Bo Merino, MD  Note - This record has been created using Editor, commissioning.  Chart creation errors have been sought, but may not always  have been located. Such creation errors do not reflect on  the standard of medical care.

## 2020-09-17 ENCOUNTER — Ambulatory Visit: Payer: Medicare Other | Admitting: Rheumatology

## 2020-09-17 DIAGNOSIS — J849 Interstitial pulmonary disease, unspecified: Secondary | ICD-10-CM

## 2020-09-17 DIAGNOSIS — Z8639 Personal history of other endocrine, nutritional and metabolic disease: Secondary | ICD-10-CM

## 2020-09-17 DIAGNOSIS — Z8659 Personal history of other mental and behavioral disorders: Secondary | ICD-10-CM

## 2020-09-17 DIAGNOSIS — G8929 Other chronic pain: Secondary | ICD-10-CM

## 2020-09-17 DIAGNOSIS — M810 Age-related osteoporosis without current pathological fracture: Secondary | ICD-10-CM

## 2020-09-17 DIAGNOSIS — M542 Cervicalgia: Secondary | ICD-10-CM

## 2020-09-17 DIAGNOSIS — M0630 Rheumatoid nodule, unspecified site: Secondary | ICD-10-CM

## 2020-09-17 DIAGNOSIS — Z86010 Personal history of colonic polyps: Secondary | ICD-10-CM

## 2020-09-17 DIAGNOSIS — L920 Granuloma annulare: Secondary | ICD-10-CM

## 2020-09-17 DIAGNOSIS — M19041 Primary osteoarthritis, right hand: Secondary | ICD-10-CM

## 2020-09-17 DIAGNOSIS — Z79899 Other long term (current) drug therapy: Secondary | ICD-10-CM

## 2020-09-17 DIAGNOSIS — M7061 Trochanteric bursitis, right hip: Secondary | ICD-10-CM

## 2020-09-17 DIAGNOSIS — M0579 Rheumatoid arthritis with rheumatoid factor of multiple sites without organ or systems involvement: Secondary | ICD-10-CM

## 2020-09-17 DIAGNOSIS — M7050 Other bursitis of knee, unspecified knee: Secondary | ICD-10-CM

## 2020-10-16 NOTE — Progress Notes (Signed)
Primary Care Physician: Burton Apley, MD Referring Physician:Luke Diona Fanti, [place] Cardiologist: Dr. [place]    Heather Koch is a 69 y.o. female with a  h/o rheumatoid arthritis on methotrexate. She was admitted  in July 2021 for acute respiratory infection with hypoxia. She had an episode of afib  during this hospitalization,short in duration and self converted.She was started on eliquis with a CHA2DS2VASc of 2, and cardizem for rate control.. She has a history of palpitations for years.  Event monitor did show afib burden of 18%, no sustained afib. CT of chest showed  of ILD. She was seen by Dr Delton Coombes  who noted significant improvement in CT findings.  She was seen in the Afib clinic in follow up and options for AAD therapy were discussed. Not a candidate for amiodarone due to ILD. Could consider Tikosyn or Multaq. Patient was Ok with current AFib burden and was managed with rate control and anticoagulation. Has coronary calcification noted on CT.   On follow up today she states she is not doing well. She is having more Afib. Last week it was fairly continuous off and on for several days. Last night she had an episode lasting 3 hours. HR typically in 140-150 range on pulse ox. Takes metoprolol but doesn't really seem to help. Feels really bad when in Afib. No dizziness or syncope. Sometimes HR noted to be as low as 43.   She is now being seen at [facility] by pulmonary and Rheumatology. Has ILD. Planned follow up PFTs and CT. Is also dealing with tendonitis in her left foot. Feels that things are falling apart.   Past Medical History:  Diagnosis Date  . ASCUS (atypical squamous cells of undetermined significance) on Pap smear 12/2008   neg HR HPV  . LGSIL (low grade squamous intraepithelial dysplasia) 2005/2006  . Lichen sclerosus 2012  . Rheumatoid arthritis(714.0) 2012   Past Surgical History:  Procedure Laterality Date  . TUBAL LIGATION  1997    Current Outpatient Medications   Medication Sig Dispense Refill  . ALPRAZolam (XANAX) 1 MG tablet Take 0.25-1 mg by mouth at bedtime as needed for anxiety or sleep.     . clobetasol cream (TEMOVATE) 0.05 % APPLY TOPICALLY AT BEDTIME AS NEEDED FOR ITCHING (Patient taking differently: Apply 1 application topically at bedtime as needed (Itching). ) 30 g 1  . diclofenac sodium (VOLTAREN) 1 % GEL Apply 3 gm to 3 large joints up to 3 times a day.Dispense 3 tubes with 3 refills. (Patient taking differently: Apply 3 g topically 3 (three) times daily as needed (Joint pain). ) 3 Tube 1  . diltiazem (CARDIZEM CD) 240 MG 24 hr capsule Take 1 capsule (240 mg total) by mouth daily. 90 capsule 3  . ELIQUIS 5 MG TABS tablet TAKE 1 TABLET BY MOUTH  TWICE DAILY 180 tablet 3  . metoprolol tartrate (LOPRESSOR) 25 MG tablet Take 1 tablet (25 mg total) by mouth as needed. For heart rate greater than 120 90 tablet 2  . Multiple Vitamin (MULTIVITAMIN PO) Take 1 tablet by mouth daily.     . simvastatin (ZOCOR) 20 MG tablet Take 1 tablet by mouth daily.    Marland Kitchen VITAMIN D PO Take 1 tablet by mouth daily.      No current facility-administered medications for this visit.    Allergies  Allergen Reactions  . Fish Allergy Other (See Comments)    [place]  Reaction: Syncope    Social History  Socioeconomic History  . Marital status: Married    Spouse name: Not on file  . Number of children: Not on file  . Years of education: Not on file  . Highest education level: Not on file  Occupational History  . Not on file  Tobacco Use  . Smoking status: Never Smoker  . Smokeless tobacco: Never Used  Vaping Use  . Vaping Use: Never used  Substance and Sexual Activity  . Alcohol use: No  . Drug use: No  . Sexual activity: Yes    Partners: Male    Birth control/protection: Post-menopausal  Other Topics Concern  . Not on file  Social History Narrative  . Not on file   Social Determinants of Health   Financial Resource Strain: Not on file  Food  Insecurity: Not on file  Transportation Needs: Not on file  Physical Activity: Not on file  Stress: Not on file  Social Connections: Not on file  Intimate Partner Violence: Not on file    Family History  Problem Relation Age of Onset  . Hypertension Mother   . Diabetes Mother   . Cancer Father        liver  . Heart disease Sister   . Aneurysm Sister   . Cancer Brother        pancreatic cancer  . Cancer Maternal Aunt        throat  . Heart disease Sister   . Heart disease Sister   . Cancer Maternal Uncle        Colon  . Heart disease Brother        pacemaker  . Healthy Son   . Healthy Son   . Healthy Daughter     ROS- All systems are reviewed and negative except as per the HPI above  Physical Exam: There were no vitals filed for this visit. Wt Readings from Last 3 Encounters:  08/05/20 153 lb 9.6 oz (69.7 kg)  07/17/20 151 lb (68.5 kg)  07/16/20 151 lb 12.8 oz (68.9 kg)    Labs: Lab Results  Component Value Date   NA 140 08/06/2020   K 4.4 08/06/2020   CL 105 08/06/2020   CO2 32 08/06/2020   GLUCOSE 88 08/06/2020   BUN 12 08/06/2020   CREATININE 0.65 08/06/2020   CALCIUM 9.7 08/06/2020   PHOS 4.0 04/23/2020   MG 2.3 04/23/2020   Lab Results  Component Value Date   INR 1.1 04/20/2020   Lab Results  Component Value Date   CHOL 191 08/06/2020   HDL 79 08/06/2020   LDLCALC 90 08/06/2020   TRIG 120 08/06/2020   LMP 03/31/2003    GEN- The patient is well appearing, alert and oriented x 3 today.   Head- normocephalic, atraumatic Eyes-  Sclera clear, conjunctiva pink Ears- hearing intact Oropharynx- clear Neck- supple, no JVP Lymph- no cervical lymphadenopathy Lungs- crackles bilaterally,  normal work of breathing Heart- Regular rate and rhythm, no murmurs, rubs or gallops, PMI not laterally displaced GI- soft, NT, ND, + BS Extremities- no clubbing, cyanosis, or edema MS- no significant deformity or atrophy Skin- no rash or lesion Psych-  euthymic mood, full affect Neuro- strength and sensation are intact   Echo7/5/21  1. Left ventricular ejection fraction, by estimation, is 50 to 55%. The left ventricle has low normal function. The left ventricle demonstrates global hypokinesis. Left ventricular diastolic parameters are indeterminate. 2. Right ventricular systolic function is normal. The right ventricular size is normal. Tricuspid regurgitation signal  is inadequate for assessing PA pressure. 3. The mitral valve is grossly normal. Trivial mitral valve regurgitation. 4. The aortic valve is tricuspid. Aortic valve regurgitation is not visualized. 5. The inferior vena cava is normal in size with greater than 50% respiratory variability, suggesting right atrial pressure of 3 mmHg.  Event monitor 06/17/20: Study Highlights   Normal sinus rhythm  Paroxysmal AFib with RVR- average HR in Afib 121 bpm. AFib burden of 13%  Rare PVCs and PACs.  Patient symptoms correlate with Afib  Assessment and Plan: 1. Paroxysmal afib- on rate control with Cardizem CD and prn metoprolol. Is clinically having more episodes and is quite symptomatic.  On Eliquis for anticoagulation I don't think we can push rate control more since she has some bradycardia as well.  I would really like EP evaluation to see is we should consider AAD therapy versus ablation.  2. CHA2DS2VASc score of 2 Continue eliquis 5 mg bid   3. RA- per rheumatology.  4. Multilobar PNA- atypical- resolved.   5. ILD now followed by pulmonary at [facility].  6. Coronary calcification. Noted primarily in mid LAD. No angina. She is on statin therapy. Labs recently done by Dr Su Hilt.   Follow up in 3 months.   Geneviene Tesch [place] MD, [facility]

## 2020-10-22 ENCOUNTER — Other Ambulatory Visit: Payer: Self-pay

## 2020-10-22 ENCOUNTER — Encounter: Payer: Self-pay | Admitting: Cardiology

## 2020-10-22 ENCOUNTER — Ambulatory Visit: Payer: Medicare Other | Admitting: Cardiology

## 2020-10-22 VITALS — BP 128/74 | HR 64 | Ht 64.0 in | Wt 153.0 lb

## 2020-10-22 DIAGNOSIS — I2584 Coronary atherosclerosis due to calcified coronary lesion: Secondary | ICD-10-CM

## 2020-10-22 DIAGNOSIS — I2510 Atherosclerotic heart disease of native coronary artery without angina pectoris: Secondary | ICD-10-CM | POA: Diagnosis not present

## 2020-10-22 DIAGNOSIS — M0579 Rheumatoid arthritis with rheumatoid factor of multiple sites without organ or systems involvement: Secondary | ICD-10-CM

## 2020-10-22 DIAGNOSIS — J849 Interstitial pulmonary disease, unspecified: Secondary | ICD-10-CM

## 2020-10-22 DIAGNOSIS — I480 Paroxysmal atrial fibrillation: Secondary | ICD-10-CM

## 2020-10-22 NOTE — Patient Instructions (Signed)
Medication Instructions:  Continue same medications *If you need a refill on your cardiac medications before your next appointment, please call your pharmacy*   Lab Work: None ordered   Testing/Procedures: None ordered   Follow-Up: At [facility], you and your health needs are our priority.  As part of our continuing mission to provide you with exceptional heart care, we have created designated Provider Care Teams.  These Care Teams include your primary Cardiologist (physician) and Advanced Practice Providers (APPs -  Physician Assistants and Nurse Practitioners) who all work together to provide you with the care you need, when you need it.  We recommend signing up for the patient portal called "MyChart".  Sign up information is provided on this After Visit Summary.  MyChart is used to connect with patients for Virtual Visits (Telemedicine).  Patients are able to view lab/test results, encounter notes, upcoming appointments, etc.  Non-urgent messages can be sent to your provider as well.   To learn more about what you can do with MyChart, go to [email or web address].    Your next appointment:  Thursday 01/22/21 at 1:20 pm   The format for your next appointment: Office     Provider: Dr.Jordan     Dr.Camnitz  Thurs 1/6 at 12:15 pm

## 2020-10-23 ENCOUNTER — Encounter: Payer: Self-pay | Admitting: Cardiology

## 2020-10-23 ENCOUNTER — Ambulatory Visit: Payer: Medicare Other | Admitting: Cardiology

## 2020-10-23 VITALS — BP 108/62 | HR 95 | Ht 64.0 in | Wt 154.0 lb

## 2020-10-23 DIAGNOSIS — I480 Paroxysmal atrial fibrillation: Secondary | ICD-10-CM | POA: Diagnosis not present

## 2020-10-23 MED ORDER — FLECAINIDE ACETATE 50 MG PO TABS: 50.0000 mg | ORAL_TABLET | Freq: Two times a day (BID) | ORAL | 3 refills | Status: DC

## 2020-10-23 NOTE — Patient Instructions (Addendum)
Medication Instructions:  Your physician has recommended you make the following change in your medication:  1. START Flecainide 50 mg twice a day  *If you need a refill on your cardiac medications before your next appointment, please call your pharmacy*   Lab Work: None ordered   Testing/Procedures: None ordered   Follow-Up: At [facility], you and your health needs are our priority.  As part of our continuing mission to provide you with exceptional heart care, we have created designated Provider Care Teams.  These Care Teams include your primary Cardiologist (physician) and Advanced Practice Providers (APPs -  Physician Assistants and Nurse Practitioners) who all work together to provide you with the care you need, when you need it.  We recommend signing up for the patient portal called "MyChart".  Sign up information is provided on this After Visit Summary.  MyChart is used to connect with patients for Virtual Visits (Telemedicine).  Patients are able to view lab/test results, encounter notes, upcoming appointments, etc.  Non-urgent messages can be sent to your provider as well.   To learn more about what you can do with MyChart, go to [email or web address].    Your next appointment:   3 month(s)  The format for your next appointment:   In Person  Provider:   Allegra Lai, MD    Thank you for choosing [facility]!!   Trinidad Curet, RN [phone]   Other Instructions  Flecainide tablets What is this medicine? FLECAINIDE (FLEK a nide) is an antiarrhythmic drug. This medicine is used to prevent irregular heart rhythm. It can also slow down fast heartbeats called tachycardia. This medicine may be used for other purposes; ask your health care provider or pharmacist if you have questions. COMMON BRAND NAME(S): Tambocor What should I tell my health care provider before I take this medicine? They need to know if you have any of these conditions:  abnormal levels  of potassium in the blood  heart disease including heart rhythm and heart rate problems  kidney or liver disease  recent heart attack  an unusual or allergic reaction to flecainide, local anesthetics, other medicines, foods, dyes, or preservatives  pregnant or trying to get pregnant  breast-feeding How should I use this medicine? Take this medicine by mouth with a glass of water. Follow the directions on the prescription label. You can take this medicine with or without food. Take your doses at regular intervals. Do not take your medicine more often than directed. Do not stop taking this medicine suddenly. This may cause serious, heart-related side effects. If your doctor wants you to stop the medicine, the dose may be slowly lowered over time to avoid any side effects. Talk to your pediatrician regarding the use of this medicine in children. While this drug may be prescribed for children as young as 1 year of age for selected conditions, precautions do apply. Overdosage: If you think you have taken too much of this medicine contact a poison control center or emergency room at once. NOTE: This medicine is only for you. Do not share this medicine with others. What if I miss a dose? If you miss a dose, take it as soon as you can. If it is almost time for your next dose, take only that dose. Do not take double or extra doses. What may interact with this medicine? Do not take this medicine with any of the following medications:  amoxapine  arsenic trioxide  certain antibiotics like clarithromycin, erythromycin, gatifloxacin, gemifloxacin, levofloxacin,  moxifloxacin, sparfloxacin, or troleandomycin  certain antidepressants called tricyclic antidepressants like amitriptyline, imipramine, or nortriptyline  certain medicines to control heart rhythm like disopyramide, encainide, moricizine, procainamide, propafenone, and  quinidine  cisapride  delavirdine  droperidol  haloperidol  hawthorn  imatinib  levomethadyl  maprotiline  medicines for malaria like chloroquine and halofantrine  pentamidine  phenothiazines like chlorpromazine, mesoridazine, prochlorperazine, thioridazine  pimozide  quinine  ranolazine  ritonavir  sertindole This medicine may also interact with the following medications:  cimetidine  dofetilide  medicines for angina or high blood pressure  medicines to control heart rhythm like amiodarone and digoxin  ziprasidone This list may not describe all possible interactions. Give your health care provider a list of all the medicines, herbs, non-prescription drugs, or dietary supplements you use. Also tell them if you smoke, drink alcohol, or use illegal drugs. Some items may interact with your medicine. What should I watch for while using this medicine? Visit your doctor or health care professional for regular checks on your progress. Because your condition and the use of this medicine carries some risk, it is a good idea to carry an identification card, necklace or bracelet with details of your condition, medications and doctor or health care professional. Check your blood pressure and pulse rate regularly. Ask your health care professional what your blood pressure and pulse rate should be, and when you should contact him or her. Your doctor or health care professional also may schedule regular blood tests and electrocardiograms to check your progress. You may get drowsy or dizzy. Do not drive, use machinery, or do anything that needs mental alertness until you know how this medicine affects you. Do not stand or sit up quickly, especially if you are an older patient. This reduces the risk of dizzy or fainting spells. Alcohol can make you more dizzy, increase flushing and rapid heartbeats. Avoid alcoholic drinks. What side effects may I notice from receiving this  medicine? Side effects that you should report to your doctor or health care professional as soon as possible:  chest pain, continued irregular heartbeats  difficulty breathing  swelling of the legs or feet  trembling, shaking  unusually weak or tired Side effects that usually do not require medical attention (report to your doctor or health care professional if they continue or are bothersome):  blurred vision  constipation  headache  nausea, vomiting  stomach pain This list may not describe all possible side effects. Call your doctor for medical advice about side effects. You may report side effects to FDA at 1-800-FDA-1088. Where should I keep my medicine? Keep out of the reach of children. Store at room temperature between 15 and 30 degrees C (59 and 86 degrees F). Protect from light. Keep container tightly closed. Throw away any unused medicine after the expiration date. NOTE: This sheet is a summary. It may not cover all possible information. If you have questions about this medicine, talk to your doctor, pharmacist, or health care provider.  2020 Elsevier/Gold Standard (2018-09-25 11:41:38)    CT INSTRUCTIONS Your cardiac CT will be scheduled at:  [facility] [place], [place] [address] [phone]  Please arrive at the [facility] main entrance of [facility] 30 minutes prior to test start time. Proceed to the [facility] Radiology Department (first floor) to check-in and test prep.  Please follow these instructions carefully (unless otherwise directed):  On the Night Before the Test: . Be sure to Drink plenty of water. Marland Kitchen  Do not consume any caffeinated/decaffeinated beverages or chocolate 12 hours prior to your test. . Do not take any antihistamines 12 hours prior to your test.  On the Day of the Test: . Drink plenty of water. Do not drink any water within one hour of the test. . Do not eat any food 4 hours prior to the  test. . You may take your regular medications prior to the test.  . Take __________________ two hours prior to test. . HOLD Furosemide/Hydrochlorothiazide morning of the test. . FEMALES- please wear underwire-free bra if available      After the Test: . Drink plenty of water. . After receiving IV contrast, you may experience a mild flushed feeling. This is normal. . On occasion, you may experience a mild rash up to 24 hours after the test. This is not dangerous. If this occurs, you can take Benadryl 25 mg and increase your fluid intake. . If you experience trouble breathing, this can be serious. If it is severe call 911 IMMEDIATELY. If it is mild, please call our office. . If you take any of these medications: Glipizide/Metformin, Avandament, Glucavance, please do not take 48 hours after completing test unless otherwise instructed.   Once we have confirmed authorization from your insurance company, we will call you to set up a date and time for your test. Based on how quickly your insurance processes prior authorizations requests, please allow up to 4 weeks to be contacted for scheduling your Cardiac CT appointment. Be advised that routine Cardiac CT appointments could be scheduled as many as 8 weeks after your provider has ordered it.  For non-scheduling related questions, please contact the cardiac imaging nurse navigator should you have any questions/concerns: Marchia Bond, Cardiac Imaging Nurse Navigator Burley Saver, Interim Cardiac Imaging Nurse [facility] Direct Office Dial: [phone]   For scheduling needs, including cancellations and rescheduling, please call [place], [phone].     Electrophysiology/Ablation Procedure Instructions   You are scheduled for a(n)  ablation on 12/19/2020 with Dr. Allegra Lai.    1.   Pre procedure testing-             A.  LAB WORK --- On ____________ .  You do NOT need to be fasting.                B.  COVID TEST-- On ____________ @ ____________ - This is a Drive Up Visit at [address] [place], [place] [address].  Someone will direct you to the appropriate testing line. Stay in your car and someone will be with you shortly.   After you are tested please go home and self quarantine until the day of your procedure.     2. On the day of your procedure _____________ you will go to [facility] hospital [phone] [employer]) at ____________.  You will go to the main entrance [address] [employer]) and enter where the [employer] parking staff are.  Your driver will drop you off and you will head down the hallway to ADMITTING.  You may have one support person come in to the hospital with you.  They will be asked to wait in the waiting room.   3.   Do not eat or drink after midnight prior to your procedure.   4.   Do not miss any doses of your blood thinner prior to the morning of your procedure or your procedure will need to be rescheduled.  Do NOT take any medications the morning of your procedure.   5.  Plan for an overnight stay, but you may be discharged home after your procedure.    If you use your phone frequently bring your phone charger, in case you have to stay.  If you are discharged after your procedure you will need someone to drive you home and be with your for 24 hours after your procedure.   6. You will follow up with the AFIB clinic 4 weeks after your procedure.  You will follow up with Dr. Curt Bears  3 months after your procedure.  These appointments will be made for you.   * If you have ANY questions please call the office [phone] and ask for Reaghan Kawa RN or send me a MyChart message   * Occasionally, EP Studies and ablations can become lengthy.  Please make your family aware of this before your procedure starts.  Average time ranges from 2-8 hours for EP studies/ablations.  Your physician will call your family after the procedure with the results.     Cardiac Ablation Cardiac ablation  is a procedure to disable (ablate) a small amount of heart tissue in very specific places. The heart has many electrical connections. Sometimes these connections are abnormal and can cause the heart to beat very fast or irregularly. Ablating some of the problem areas can improve the heart rhythm or return it to normal. Ablation may be done for people who:  Have Wolff-Parkinson-White syndrome.  Have fast heart rhythms (tachycardia).  Have taken medicines for an abnormal heart rhythm (arrhythmia) that were not effective or caused side effects.  Have a high-risk heartbeat that may be life-threatening. During the procedure, a small incision is made in the neck or the groin, and a long, thin, flexible tube (catheter) is inserted into the incision and moved to the heart. Small devices (electrodes) on the tip of the catheter will send out electrical currents. A type of X-ray (fluoroscopy) will be used to help guide the catheter and to provide images of the heart. Tell a health care provider about:  Any allergies you have.  All medicines you are taking, including vitamins, herbs, eye drops, creams, and over-the-counter medicines.  Any problems you or family members have had with anesthetic medicines.  Any blood disorders you have.  Any surgeries you have had.  Any medical conditions you have, such as kidney failure.  Whether you are pregnant or may be pregnant. What are the risks? Generally, this is a safe procedure. However, problems may occur, including:  Infection.  Bruising and bleeding at the catheter insertion site.  Bleeding into the chest, especially into the sac that surrounds the heart. This is a serious complication.  Stroke or blood clots.  Damage to other structures or organs.  Allergic reaction to medicines or dyes.  Need for a permanent pacemaker if the normal electrical system is damaged. A pacemaker is a small computer that sends electrical signals to the heart and  helps your heart beat normally.  The procedure not being fully effective. This may not be recognized until months later. Repeat ablation procedures are sometimes required. What happens before the procedure?  Follow instructions from your health care provider about eating or drinking restrictions.  Ask your health care provider about: ? Changing or stopping your regular medicines. This is especially important if you are taking diabetes medicines or blood thinners. ? Taking medicines such as aspirin and ibuprofen. These medicines can thin your blood. Do  not take these medicines before your procedure if your health care provider instructs you not to.  Plan to have someone take you home from the hospital or clinic.  If you will be going home right after the procedure, plan to have someone with you for 24 hours. What happens during the procedure?  To lower your risk of infection: ? Your health care team will wash or sanitize their hands. ? Your skin will be washed with soap. ? Hair may be removed from the incision area.  An IV tube will be inserted into one of your veins.  You will be given a medicine to help you relax (sedative).  The skin on your neck or groin will be numbed.  An incision will be made in your neck or your groin.  A needle will be inserted through the incision and into a large vein in your neck or groin.  A catheter will be inserted into the needle and moved to your heart.  Dye may be injected through the catheter to help your surgeon see the area of the heart that needs treatment.  Electrical currents will be sent from the catheter to ablate heart tissue in desired areas. There are three types of energy that may be used to ablate heart tissue: ? Heat (radiofrequency energy). ? Laser energy. ? Extreme cold (cryoablation).  When the necessary tissue has been ablated, the catheter will be removed.  Pressure will be held on the catheter insertion area to prevent  excessive bleeding.  A bandage (dressing) will be placed over the catheter insertion area. The procedure may vary among health care providers and hospitals. What happens after the procedure?  Your blood pressure, heart rate, breathing rate, and blood oxygen level will be monitored until the medicines you were given have worn off.  Your catheter insertion area will be monitored for bleeding. You will need to lie still for a few hours to ensure that you do not bleed from the catheter insertion area.  Do not drive for 24 hours or as long as directed by your health care provider. Summary  Cardiac ablation is a procedure to disable (ablate) a small amount of heart tissue in very specific places. Ablating some of the problem areas can improve the heart rhythm or return it to normal.  During the procedure, electrical currents will be sent from the catheter to ablate heart tissue in desired areas. This information is not intended to replace advice given to you by your health care provider. Make sure you discuss any questions you have with your health care provider. Document Revised: 03/27/2018 Document Reviewed: 08/23/2016 Elsevier Patient Education  [place].

## 2020-10-23 NOTE — Progress Notes (Signed)
Electrophysiology Office Note   Date:  10/23/2020   ID:  Heather Koch, DOB 1950/12/20, MRN [id number]  PCP:  Lorene Dy, MD  Cardiologist:  [place] Primary Electrophysiologist:  Constance Haw, MD    Chief Complaint: AF   History of Present Illness: Heather Koch is a 70 y.o. female who is being seen today for the evaluation of AF at the request of Lorene Dy, MD. Presenting today for electrophysiology evaluation.  She has a history significant for rheumatoid arthritis on methotrexate.  She was admitted July 2021 for acute respiratory infection with hypoxia.  She had atrial fibrillation during that hospitalization that was short in duration and self converted.  She wore a cardiac monitor that showed an 18% atrial fibrillation burden.  Chest CT showed evidence of ILD.  She was seen in atrial fibrillation clinic who offered dofetilide or Multaq.  Amiodarone was not the best option due to her history of ILD.  She chose a rate control strategy.  She represented to cardiology clinic with more frequent episodes of atrial fibrillation, lasting up to 3 hours at a time.  Heart rates are typically in the 1 40-1 50 range.  She takes metoprolol, but this does not seem to help.  She also has some evidence of bradycardia with heart rates in the 40s.  Today, she denies symptoms of chest pain, orthopnea, PND, lower extremity edema, claudication, dizziness, presyncope, syncope, bleeding, or neurologic sequela. The patient is tolerating medications without difficulties.  Her symptoms while in atrial fibrillation or shortness of breath, fatigue, and headache.  Her shortness of breath is mild.  She is in atrial fibrillation today.  She said that she went in on her way to the office.  She also states that her heart rates get quite variable in atrial fibrillation, both fast and slow.  When she is not in atrial fibrillation, she feels well.   Past Medical History:  Diagnosis Date  . ASCUS (atypical  squamous cells of undetermined significance) on Pap smear 12/2008   neg HR HPV  . LGSIL (low grade squamous intraepithelial dysplasia) 2005/2006  . Lichen sclerosus 5188  . Rheumatoid arthritis(714.0) 2012   Past Surgical History:  Procedure Laterality Date  . TUBAL LIGATION  1997     Current Outpatient Medications  Medication Sig Dispense Refill  . ALPRAZolam (XANAX) 1 MG tablet Take 0.25-1 mg by mouth at bedtime as needed for anxiety or sleep.    . clobetasol cream (TEMOVATE) 0.05 % APPLY TOPICALLY AT BEDTIME AS NEEDED FOR ITCHING (Patient taking differently: Apply 1 application topically at bedtime as needed (Itching).) 30 g 1  . diclofenac sodium (VOLTAREN) 1 % GEL Apply 3 gm to 3 large joints up to 3 times a day.Dispense 3 tubes with 3 refills. (Patient taking differently: Apply 3 g topically 3 (three) times daily as needed (Joint pain).) 3 Tube 1  . diltiazem (CARDIZEM CD) 240 MG 24 hr capsule Take 1 capsule (240 mg total) by mouth daily. 90 capsule 3  . ELIQUIS 5 MG TABS tablet TAKE 1 TABLET BY MOUTH  TWICE DAILY 180 tablet 3  . flecainide (TAMBOCOR) 50 MG tablet Take 1 tablet (50 mg total) by mouth 2 (two) times daily. 60 tablet 3  . metoprolol tartrate (LOPRESSOR) 25 MG tablet Take 1 tablet (25 mg total) by mouth as needed. For heart rate greater than 120 90 tablet 2  . Multiple Vitamin (MULTIVITAMIN PO) Take 1 tablet by mouth daily.     Marland Kitchen  simvastatin (ZOCOR) 20 MG tablet Take 1 tablet by mouth daily.    Marland Kitchen VITAMIN D PO Take 1 tablet by mouth daily.      No current facility-administered medications for this visit.    Allergies:   Fish allergy   Social History:  The patient  reports that she has never smoked. She has never used smokeless tobacco. She reports that she does not drink alcohol and does not use drugs.   Family History:  The patient's family history includes Aneurysm in her sister; Cancer in her brother, father, maternal aunt, and maternal uncle; Diabetes in her  mother; Healthy in her daughter, son, and son; Heart disease in her brother, sister, sister, and sister; Hypertension in her mother.    ROS:  Please see the history of present illness.   Otherwise, review of systems is positive for none.   All other systems are reviewed and negative.    PHYSICAL EXAM: VS:  BP 108/62   Pulse 95   Ht 5\' 4"  (1.626 m)   Wt 154 lb (69.9 kg)   LMP 03/31/2003   SpO2 95%   BMI 26.43 kg/m  , BMI Body mass index is 26.43 kg/m. GEN: Well nourished, well developed, in no acute distress  HEENT: normal  Neck: no JVD, carotid bruits, or masses Cardiac: Irregular, tachycardic; no murmurs, rubs, or gallops,no edema  Respiratory:  clear to auscultation bilaterally, normal work of breathing GI: soft, nontender, nondistended, + BS MS: no deformity or atrophy  Skin: warm and dry Neuro:  Strength and sensation are intact Psych: euthymic mood, full affect  EKG:  EKG is ordered today. Personal review of the ekg ordered shows atrial fibrillation, rate 95  Recent Labs: 04/21/2020: B Natriuretic Peptide 148.3; TSH 1.424 04/23/2020: Magnesium 2.3 08/06/2020: ALT 13; BUN 12; Creat 0.65; Hemoglobin 13.6; Platelets 241; Potassium 4.4; Sodium 140    Lipid Panel     Component Value Date/Time   CHOL 191 08/06/2020 0921   TRIG 120 08/06/2020 0921   HDL 79 08/06/2020 0921   CHOLHDL 2.4 08/06/2020 0921   LDLCALC 90 08/06/2020 0921     Wt Readings from Last 3 Encounters:  10/23/20 154 lb (69.9 kg)  10/22/20 153 lb (69.4 kg)  08/05/20 153 lb 9.6 oz (69.7 kg)      Other studies Reviewed: Additional studies/ records that were reviewed today include: TTE 04/21/20  Review of the above records today demonstrates:  1. Left ventricular ejection fraction, by estimation, is 50 to 55%. The  left ventricle has low normal function. The left ventricle demonstrates  global hypokinesis. Left ventricular diastolic parameters are  indeterminate.  2. Right ventricular systolic  function is normal. The right ventricular  size is normal. Tricuspid regurgitation signal is inadequate for assessing  PA pressure.  3. The mitral valve is grossly normal. Trivial mitral valve  regurgitation.  4. The aortic valve is tricuspid. Aortic valve regurgitation is not  visualized.  5. The inferior vena cava is normal in size with greater than 50%  respiratory variability, suggesting right atrial pressure of 3 mmHg.  Cardiac monitor 06/17/2020 personally reviewed  Normal sinus rhythm  Paroxysmal AFib with RVR- average HR in Afib 121 bpm. AFib burden of 13%  Rare PVCs and PACs.  Patient symptoms correlate with Afib.   ASSESSMENT AND PLAN:  1.  Paroxysmal atrial fibrillation: Currently on Cardizem, as needed metoprolol, and Eliquis.  CHA2DS2-VASc of 2.  I spoke with her about options of medication management versus ablation.  At this point, she would like to try medications to see if they improve her symptoms.  We Veronika Heard start her on 50 mg of flecainide.  This can be increased to 100 mg based on her symptoms.  We Kam Kushnir also give her a date for ablation if flecainide does not improve her symptoms.  On exam today, she is quite tachycardic and irregular.  It could be that she is in atrial flutter.  2.  Coronary artery calcifications: Noted in the mid LAD without angina.  Currently on statin per primary cardiology.  3.  Rheumatoid arthritis: Methotrexate per rheumatology.  4.  ILD: [facility] pulmonary at [facility].  Case discussed with primary  Current medicines are reviewed at length with the patient today.   The patient does not have concerns regarding her medicines.  The following changes were made today: Start flecainide  Labs/ tests ordered today include:  Orders Placed This Encounter  Procedures  . EKG 12-Lead     Disposition:   FU with Pasqualino Witherspoon 3 months  Signed, Dewie Ahart Jorja Loa, MD  10/23/2020 1:11 PM     [facility] [address] [place] [address] [phone] (office) [phone] (fax)

## 2020-11-10 ENCOUNTER — Telehealth: Payer: Self-pay | Admitting: Cardiology

## 2020-11-10 NOTE — Telephone Encounter (Signed)
Patient's husband is requesting to speak with Dr. Gershon Crane nurse. He states the patient is considering  having an ablation, as previously discussed. He states was also previously informed that the patient will need a CT prior to having the ablation. He is requesting to have an order placed for the CT so he and the patient can proceed with scheduling.

## 2020-11-11 NOTE — Telephone Encounter (Signed)
Spoke to husband (ok per pt) HRs jumping from 40s to 110s-120s daily Unsure if irregular/regular pulse BPs avg/low 110s/60-70s Pt doesn't feel great when HRs low/jumping around, no other complaints/concerns.  Husband aware I will forward to Dr. Elberta Fortis for advisement. Aware he may increase Flecainide to see if can get AFib under control. (ablation date held for 3/4) Aware it will be end of week before hearing back from our office. Husband agreeable to plan.

## 2020-11-12 NOTE — Telephone Encounter (Signed)
Ok to increase flecainide.

## 2020-11-14 MED ORDER — FLECAINIDE ACETATE 100 MG PO TABS: 100.0000 mg | ORAL_TABLET | Freq: Two times a day (BID) | ORAL | 3 refills | Status: DC

## 2020-11-14 NOTE — Telephone Encounter (Signed)
Advised husband of MD recommendation. Agreeable to plan, but seemed hesitant. Per husband they believe that pt was perfectly ok until recent hospitalization and they changed or doubled all these medications recently, and since then she has not been ok. They are going to discuss increasing another medication, such as Flecainide. She will take 2 tablets BID, if she is agreeable to increase, and will send in Rx for increased dose once confirmed pt has done so and doing ok. Scheduled a follow up EKG for 2/9  (although when offering 2/7, he stated it was too soon and I explained protocol for checking EKG 7-10 days post medication increase).  He was agreeable to 2/9 but stated he would let us know if that would not work for their schedule. Aware I will follow up next week to see how pt is doing. Husband agreeable to plan.

## 2020-11-26 ENCOUNTER — Encounter: Payer: Self-pay | Admitting: *Deleted

## 2020-11-26 ENCOUNTER — Other Ambulatory Visit: Payer: Self-pay

## 2020-11-26 ENCOUNTER — Ambulatory Visit (INDEPENDENT_AMBULATORY_CARE_PROVIDER_SITE_OTHER): Payer: Medicare Other | Admitting: *Deleted

## 2020-11-26 VITALS — Ht 64.0 in | Wt 158.4 lb

## 2020-11-26 DIAGNOSIS — I480 Paroxysmal atrial fibrillation: Secondary | ICD-10-CM | POA: Diagnosis not present

## 2020-11-26 DIAGNOSIS — Z79899 Other long term (current) drug therapy: Secondary | ICD-10-CM | POA: Diagnosis not present

## 2020-11-26 NOTE — Progress Notes (Signed)
1.  Reason for visit: Flecainide increase  2.  Name of MD requesting visit:  [place]  3. H&P:  See epic  4.  ROS related to problem:    5.  Assessment and plan per MD:   EKG performed post Flecainide increase. EKG showing AFib, HR 85 Reviewed by DOD, Dr. Shari Prows, no orders received.   Pt reports that Flecainide is not controlling AFib.  She also states that PRN Metoprolol is not helping when she takes it.  Pt aware Dr. Elberta Fortis and I will discuss treatment plan.  Pt advised to continue current medications until she hears back from me.  Aware it may be end of the week before hearing back from me on this matter.  Pt and husband agreeable to plan.

## 2020-12-01 ENCOUNTER — Other Ambulatory Visit: Payer: Self-pay | Admitting: *Deleted

## 2020-12-01 DIAGNOSIS — Z01812 Encounter for preprocedural laboratory examination: Secondary | ICD-10-CM

## 2020-12-01 DIAGNOSIS — I480 Paroxysmal atrial fibrillation: Secondary | ICD-10-CM

## 2020-12-01 MED ORDER — DILTIAZEM HCL 30 MG PO TABS: 30.0000 mg | ORAL_TABLET | Freq: Four times a day (QID) | ORAL | 2 refills | Status: DC | PRN

## 2020-12-01 NOTE — Addendum Note (Signed)
Addended by: Baird Lyons on: 12/01/2020 12:05 PM   Modules accepted: Orders

## 2020-12-01 NOTE — Progress Notes (Signed)
Spoke to husband (ok per pt). Advised to stop Flecainide & PRN Lopressor. Advised to start Diltiazem 30 mg q6h PRN. Husband verbalized understanding and agreeable to plan.   Pt will stop by office 2/16 for lab work.

## 2020-12-01 NOTE — Addendum Note (Signed)
Addended by: Baird Lyons on: 12/01/2020 12:00 PM   Modules accepted: Orders

## 2020-12-03 ENCOUNTER — Other Ambulatory Visit: Payer: Medicare Other

## 2020-12-03 ENCOUNTER — Other Ambulatory Visit: Payer: Self-pay

## 2020-12-03 DIAGNOSIS — Z01812 Encounter for preprocedural laboratory examination: Secondary | ICD-10-CM

## 2020-12-03 DIAGNOSIS — I480 Paroxysmal atrial fibrillation: Secondary | ICD-10-CM

## 2020-12-03 LAB — CBC
Hematocrit: 36.5 % (ref 34.0–46.6)
Hemoglobin: 12.2 g/dL (ref 11.1–15.9)
MCH: 30.7 pg (ref 26.6–33.0)
MCHC: 33.4 g/dL (ref 31.5–35.7)
MCV: 92 fL (ref 79–97)
Platelets: 300 10*3/uL (ref 150–450)
RBC: 3.98 x10E6/uL (ref 3.77–5.28)
RDW: 12.9 % (ref 11.7–15.4)
WBC: 9.2 10*3/uL (ref 3.4–10.8)

## 2020-12-03 LAB — BASIC METABOLIC PANEL
BUN/Creatinine Ratio: 20 (ref 12–28)
BUN: 13 mg/dL (ref 8–27)
CO2: 23 mmol/L (ref 20–29)
Calcium: 9.8 mg/dL (ref 8.7–10.3)
Chloride: 101 mmol/L (ref 96–106)
Creatinine, Ser: 0.64 mg/dL (ref 0.57–1.00)
GFR calc Af Amer: 106 mL/min/{1.73_m2} (ref >59)
GFR calc non Af Amer: 92 mL/min/{1.73_m2} (ref >59)
Glucose: 97 mg/dL (ref 65–99)
Potassium: 4.4 mmol/L (ref 3.5–5.2)
Sodium: 139 mmol/L (ref 134–144)

## 2020-12-08 ENCOUNTER — Telehealth (HOSPITAL_COMMUNITY): Payer: Self-pay | Admitting: Emergency Medicine

## 2020-12-08 NOTE — Telephone Encounter (Signed)
Reaching out to patient to offer assistance regarding upcoming cardiac imaging study; pt verbalizes understanding of appt date/time, parking situation and where to check in, pre-test NPO status and medications ordered, and verified current allergies; name and call back number provided for further questions should they arise Heather Ebers RN Navigator Cardiac Imaging [facility] [phone] office [phone] cell 

## 2020-12-10 ENCOUNTER — Other Ambulatory Visit: Payer: Self-pay

## 2020-12-10 ENCOUNTER — Ambulatory Visit (HOSPITAL_COMMUNITY)
Admission: RE | Admit: 2020-12-10 | Discharge: 2020-12-10 | Disposition: A | Discharge status: Home / Self Care | Payer: Medicare Other | Source: Ambulatory Visit | Attending: Cardiology | Admitting: Cardiology

## 2020-12-10 ENCOUNTER — Encounter (HOSPITAL_COMMUNITY): Payer: Self-pay

## 2020-12-10 DIAGNOSIS — I480 Paroxysmal atrial fibrillation: Secondary | ICD-10-CM

## 2020-12-10 MED ORDER — IOHEXOL 350 MG/ML SOLN
80.0000 mL | Freq: Once | INTRAVENOUS | Status: AC | PRN
  Administered 2020-12-10: 80 mL via INTRAVENOUS

## 2020-12-15 ENCOUNTER — Telehealth: Payer: Self-pay | Admitting: Cardiology

## 2020-12-15 NOTE — Telephone Encounter (Signed)
Pt inquired about taking her Eliquis the day before her ablation as norma.  Pt aware to take day before as normal. She also confirmed she should be arriving at  5:30 am morning of ablation -- confirmed that yes she should. Patient verbalized understanding and agreeable to plan.

## 2020-12-15 NOTE — Telephone Encounter (Signed)
   Pt requesting to speak with Sherri, she said she has some questions about her upcoming procedure

## 2020-12-17 ENCOUNTER — Other Ambulatory Visit: Payer: Self-pay

## 2020-12-17 ENCOUNTER — Other Ambulatory Visit (HOSPITAL_COMMUNITY): Payer: Medicare Other

## 2020-12-17 ENCOUNTER — Other Ambulatory Visit (HOSPITAL_COMMUNITY)
Admission: RE | Admit: 2020-12-17 | Discharge: 2020-12-17 | Disposition: A | Discharge status: Home / Self Care | Payer: Medicare Other | Source: Ambulatory Visit | Attending: Cardiology | Admitting: Cardiology

## 2020-12-17 ENCOUNTER — Ambulatory Visit (INDEPENDENT_AMBULATORY_CARE_PROVIDER_SITE_OTHER): Payer: Medicare Other | Admitting: Nurse Practitioner

## 2020-12-17 VITALS — BP 128/68 | HR 84 | Ht 64.0 in | Wt 156.0 lb

## 2020-12-17 DIAGNOSIS — Z20822 Contact with and (suspected) exposure to covid-19: Secondary | ICD-10-CM | POA: Diagnosis not present

## 2020-12-17 DIAGNOSIS — Z01812 Encounter for preprocedural laboratory examination: Secondary | ICD-10-CM | POA: Insufficient documentation

## 2020-12-17 DIAGNOSIS — I480 Paroxysmal atrial fibrillation: Secondary | ICD-10-CM | POA: Diagnosis not present

## 2020-12-17 LAB — SARS CORONAVIRUS 2 (TAT 6-24 HRS): SARS Coronavirus 2: NEGATIVE

## 2020-12-17 NOTE — Progress Notes (Signed)
1.) Reason for visit: EKG in preparation for a fib ablation scheduled 12/19/20  2.) Name of MD requesting visit: Dr. Elberta Fortis  3.) H&P: Pt presents for follow-up ekg to evaluate for a fib. She is scheduled for a fib ablation on 3/4 and if she is still in a fib will need TEE prior to ablation.   4.) ROS related to problem: Pt in NAD, states she is feeling well. She states her HR was 90 when she woke up this morning and she has not had any concerning symptoms. She is taking medications as prescribed.   5.) Assessment and plan per MD: EKG today RSR at 84 bpm confirmed by Dr. Graciela Husbands. Patient aware to arrive at [facility] at 5:30 am on 3/4 as scheduled for ablation and that she will not need TEE prior. Pre-procedure instructions reviewed with patient and questions were answered to her satisfaction.

## 2020-12-17 NOTE — Patient Instructions (Signed)
Medication Instructions:  Your physician recommends that you continue on your current medications as directed. Please refer to the Current Medication list given to you today.  *If you need a refill on your cardiac medications before your next appointment, please call your pharmacy*   Lab Work: None Ordered If you have labs (blood work) drawn today and your tests are completely normal, you will receive your results only by: Marland Kitchen MyChart Message (if you have MyChart) OR . A paper copy in the mail If you have any lab test that is abnormal or we need to change your treatment, we will call you to review the results.   Testing/Procedures: Patient aware of plan for a fib ablation on 3/4 and has pre-procedure instructions.    Follow-Up: At [facility], you and your health needs are our priority.  As part of our continuing mission to provide you with exceptional heart care, we have created designated Provider Care Teams.  These Care Teams include your primary Cardiologist (physician) and Advanced Practice Providers (APPs -  Physician Assistants and Nurse Practitioners) who all work together to provide you with the care you need, when you need it.  We recommend signing up for the patient portal called "MyChart".  Sign up information is provided on this After Visit Summary.  MyChart is used to connect with patients for Virtual Visits (Telemedicine).  Patients are able to view lab/test results, encounter notes, upcoming appointments, etc.  Non-urgent messages can be sent to your provider as well.   To learn more about what you can do with MyChart, go to [email or web address].     Your next appointment:   4 week(s) on April 6 at 2:00 pm  The format for your next appointment:   In Person  Provider:   You will follow up in the Atrial Fibrillation Clinic located at [facility]. Your provider will be: Rudi Coco, NP

## 2020-12-18 NOTE — Addendum Note (Signed)
Addended by: Baird Lyons on: 12/18/2020 12:01 PM   Modules accepted: Orders

## 2020-12-18 NOTE — Progress Notes (Signed)
Instructed patient on the following items: Arrival time 0530 Nothing to eat or drink after midnight No meds AM of procedure Responsible person to drive you home and stay with you for 24 hrs  Have you missed any doses of anti-coagulant Eliquis- hasn't missed any doses    

## 2020-12-19 ENCOUNTER — Ambulatory Visit (HOSPITAL_COMMUNITY)
Admission: RE | Disposition: A | Discharge status: Home / Self Care | Payer: Self-pay | Source: Home / Self Care | Attending: Cardiology

## 2020-12-19 ENCOUNTER — Ambulatory Visit (HOSPITAL_COMMUNITY): Payer: Medicare Other | Admitting: Anesthesiology

## 2020-12-19 ENCOUNTER — Ambulatory Visit (HOSPITAL_COMMUNITY)
Admission: RE | Admit: 2020-12-19 | Discharge: 2020-12-19 | Disposition: A | Discharge status: Home / Self Care | Payer: Medicare Other | Attending: Cardiology | Admitting: Cardiology

## 2020-12-19 ENCOUNTER — Other Ambulatory Visit: Payer: Self-pay

## 2020-12-19 DIAGNOSIS — I4819 Other persistent atrial fibrillation: Secondary | ICD-10-CM | POA: Insufficient documentation

## 2020-12-19 HISTORY — PX: ATRIAL FIBRILLATION ABLATION: EP1191

## 2020-12-19 LAB — POCT ACTIVATED CLOTTING TIME
Activated Clotting Time: 357 seconds
Activated Clotting Time: 392 seconds

## 2020-12-19 SURGERY — ATRIAL FIBRILLATION ABLATION
Anesthesia: General

## 2020-12-19 MED ORDER — HEPARIN (PORCINE) IN NACL 1000-0.9 UT/500ML-% IV SOLN
INTRAVENOUS | Status: AC
  Filled 2020-12-19: qty 500

## 2020-12-19 MED ORDER — APIXABAN 5 MG PO TABS
5.0000 mg | ORAL_TABLET | Freq: Once | ORAL | Status: AC
  Administered 2020-12-19: 5 mg via ORAL
  Filled 2020-12-19 (×2): qty 1

## 2020-12-19 MED ORDER — SODIUM CHLORIDE 0.9 % IV SOLN: 250.0000 mL | INTRAVENOUS | Status: DC | PRN

## 2020-12-19 MED ORDER — ONDANSETRON HCL 4 MG/2ML IJ SOLN
INTRAMUSCULAR | Status: DC | PRN
  Administered 2020-12-19: 4 mg via INTRAVENOUS

## 2020-12-19 MED ORDER — HEPARIN SODIUM (PORCINE) 1000 UNIT/ML IJ SOLN
INTRAMUSCULAR | Status: AC
  Filled 2020-12-19: qty 1

## 2020-12-19 MED ORDER — SODIUM CHLORIDE 0.9% FLUSH: 3.0000 mL | INTRAVENOUS | Status: DC | PRN

## 2020-12-19 MED ORDER — FENTANYL CITRATE (PF) 250 MCG/5ML IJ SOLN
INTRAMUSCULAR | Status: DC | PRN
  Administered 2020-12-19: 100 ug via INTRAVENOUS

## 2020-12-19 MED ORDER — SODIUM CHLORIDE 0.9 % IV SOLN: INTRAVENOUS | Status: DC | PRN

## 2020-12-19 MED ORDER — SODIUM CHLORIDE 0.9% FLUSH: 3.0000 mL | Freq: Two times a day (BID) | INTRAVENOUS | Status: DC

## 2020-12-19 MED ORDER — ACETAMINOPHEN 325 MG PO TABS
650.0000 mg | ORAL_TABLET | ORAL | Status: DC | PRN
  Administered 2020-12-19: 650 mg via ORAL
  Filled 2020-12-19: qty 2

## 2020-12-19 MED ORDER — ADENOSINE 6 MG/2ML IV SOLN
INTRAVENOUS | Status: DC | PRN
  Administered 2020-12-19: 12 mg via INTRAVENOUS

## 2020-12-19 MED ORDER — DOBUTAMINE IN D5W 4-5 MG/ML-% IV SOLN
INTRAVENOUS | Status: DC | PRN
  Administered 2020-12-19: 20 ug/kg/min via INTRAVENOUS

## 2020-12-19 MED ORDER — SODIUM CHLORIDE 0.9 % IV SOLN: INTRAVENOUS | Status: DC

## 2020-12-19 MED ORDER — ADENOSINE 6 MG/2ML IV SOLN
INTRAVENOUS | Status: AC
  Filled 2020-12-19: qty 4

## 2020-12-19 MED ORDER — HEPARIN SODIUM (PORCINE) 1000 UNIT/ML IJ SOLN
INTRAMUSCULAR | Status: DC | PRN
  Administered 2020-12-19: 14000 [IU] via INTRAVENOUS

## 2020-12-19 MED ORDER — ONDANSETRON HCL 4 MG/2ML IJ SOLN: 4.0000 mg | Freq: Four times a day (QID) | INTRAMUSCULAR | Status: DC | PRN

## 2020-12-19 MED ORDER — MIDAZOLAM HCL 5 MG/5ML IJ SOLN
INTRAMUSCULAR | Status: DC | PRN
  Administered 2020-12-19: 2 mg via INTRAVENOUS

## 2020-12-19 MED ORDER — DEXAMETHASONE SODIUM PHOSPHATE 10 MG/ML IJ SOLN
INTRAMUSCULAR | Status: DC | PRN
  Administered 2020-12-19: 5 mg via INTRAVENOUS

## 2020-12-19 MED ORDER — PHENYLEPHRINE HCL (PRESSORS) 10 MG/ML IV SOLN
INTRAVENOUS | Status: DC | PRN
  Administered 2020-12-19: 80 ug via INTRAVENOUS
  Administered 2020-12-19: 120 ug via INTRAVENOUS
  Administered 2020-12-19: 80 ug via INTRAVENOUS

## 2020-12-19 MED ORDER — ALBUMIN HUMAN 5 % IV SOLN: INTRAVENOUS | Status: DC | PRN

## 2020-12-19 MED ORDER — ROCURONIUM BROMIDE 10 MG/ML (PF) SYRINGE
PREFILLED_SYRINGE | INTRAVENOUS | Status: DC | PRN
  Administered 2020-12-19: 50 mg via INTRAVENOUS

## 2020-12-19 MED ORDER — SUGAMMADEX SODIUM 200 MG/2ML IV SOLN
INTRAVENOUS | Status: DC | PRN
  Administered 2020-12-19: 150 mg via INTRAVENOUS

## 2020-12-19 MED ORDER — PHENYLEPHRINE HCL-NACL 10-0.9 MG/250ML-% IV SOLN
INTRAVENOUS | Status: DC | PRN
  Administered 2020-12-19: 40 ug/min via INTRAVENOUS

## 2020-12-19 MED ORDER — PROTAMINE SULFATE 10 MG/ML IV SOLN
INTRAVENOUS | Status: DC | PRN
  Administered 2020-12-19: 10 mg via INTRAVENOUS
  Administered 2020-12-19: 30 mg via INTRAVENOUS

## 2020-12-19 MED ORDER — LIDOCAINE 2% (20 MG/ML) 5 ML SYRINGE
INTRAMUSCULAR | Status: DC | PRN
  Administered 2020-12-19: 100 mg via INTRAVENOUS
  Administered 2020-12-19: 40 mg via INTRAVENOUS
  Administered 2020-12-19: 80 mg via INTRAVENOUS

## 2020-12-19 MED ORDER — DOBUTAMINE IN D5W 4-5 MG/ML-% IV SOLN
INTRAVENOUS | Status: AC
  Filled 2020-12-19: qty 250

## 2020-12-19 MED ORDER — ACETAMINOPHEN 325 MG PO TABS
ORAL_TABLET | ORAL | Status: AC
  Filled 2020-12-19: qty 2

## 2020-12-19 SURGICAL SUPPLY — 22 items
BAG SNAP BAND KOVER 36X36 (MISCELLANEOUS) ×2 IMPLANT
BLANKET WARM UNDERBOD FULL ACC (MISCELLANEOUS) ×2 IMPLANT
CATH MAPPNG PENTARAY F 2-6-2MM (CATHETERS) ×1 IMPLANT
CATH S CIRCA THERM PROBE 10F (CATHETERS) ×2 IMPLANT
CATH SMTCH THERMOCOOL SF DF (CATHETERS) ×2 IMPLANT
CATH SOUNDSTAR ECO 8FR (CATHETERS) ×2 IMPLANT
CATH WEBSTER BI DIR CS D-F CRV (CATHETERS) ×2 IMPLANT
CLOSURE PERCLOSE PROSTYLE (VASCULAR PRODUCTS) ×8 IMPLANT
COVER SWIFTLINK CONNECTOR (BAG) ×2 IMPLANT
KIT VERSACROSS STEERABLE D1 (CATHETERS) ×2 IMPLANT
MAT PREVALON FULL STRYKER (MISCELLANEOUS) ×2 IMPLANT
PACK EP LATEX FREE (CUSTOM PROCEDURE TRAY) ×2
PACK EP LF (CUSTOM PROCEDURE TRAY) ×1 IMPLANT
PAD PRO RADIOLUCENT 2001M-C (PAD) ×2 IMPLANT
PATCH CARTO3 (PAD) ×2 IMPLANT
PENTARAY F 2-6-2MM (CATHETERS) ×2
SHEATH CARTO VIZIGO SM CVD (SHEATH) ×2 IMPLANT
SHEATH PINNACLE 7F 10CM (SHEATH) ×2 IMPLANT
SHEATH PINNACLE 8F 10CM (SHEATH) ×4 IMPLANT
SHEATH PINNACLE 9F 10CM (SHEATH) ×2 IMPLANT
SHEATH PROBE COVER 6X72 (BAG) ×2 IMPLANT
TUBING SMART ABLATE COOLFLOW (TUBING) ×2 IMPLANT

## 2020-12-19 NOTE — H&P (Signed)
Electrophysiology Office Note   Date:  12/19/2020   ID:  Heather Koch, DOB [birth year], MRN [id number]  PCP:  Burton Apley, MD  Cardiologist:  [place] Primary Electrophysiologist:  Regan Lemming, MD    Chief Complaint: AF   History of Present Illness: Heather Koch is a 70 y.o. female who is being seen today for the evaluation of AF at the request of No ref. provider found. Presenting today for electrophysiology evaluation.  She has a history significant for rheumatoid arthritis on methotrexate.  She was admitted July 2021 for acute respiratory infection with hypoxia.  She had atrial fibrillation during that hospitalization that was short in duration and self converted.  She wore a cardiac monitor that showed an 18% atrial fibrillation burden.  Chest CT showed evidence of ILD.  She was seen in atrial fibrillation clinic who offered dofetilide or Multaq.  Amiodarone was not the best option due to her history of ILD.  She chose a rate control strategy.  She represented to cardiology clinic with more frequent episodes of atrial fibrillation, lasting up to 3 hours at a time.  Heart rates are typically in the 1 40-1 50 range.  She takes metoprolol, but this does not seem to help.  She also has some evidence of bradycardia with heart rates in the 40s.  Today, denies symptoms of palpitations, chest pain, shortness of breath, orthopnea, PND, lower extremity edema, claudication, dizziness, presyncope, syncope, bleeding, or neurologic sequela. The patient is tolerating medications without difficulties. Plan for ablation today.    Past Medical History:  Diagnosis Date  . ASCUS (atypical squamous cells of undetermined significance) on Pap smear 12/2008   neg HR HPV  . LGSIL (low grade squamous intraepithelial dysplasia) 2005/2006  . Lichen sclerosus 2012  . Rheumatoid arthritis(714.0) 2012   Past Surgical History:  Procedure Laterality Date  . TUBAL LIGATION  1997     Current  Facility-Administered Medications  Medication Dose Route Frequency Provider Last Rate Last Admin  . 0.9 %  sodium chloride infusion   Intravenous Continuous Regan Lemming, MD 50 mL/hr at 12/19/20 0635 New Bag at 12/19/20 4098    Allergies:   Fish allergy   Social History:  The patient  reports that she has never smoked. She has never used smokeless tobacco. She reports that she does not drink alcohol and does not use drugs.   Family History:  The patient's family history includes Aneurysm in her sister; Cancer in her brother, father, maternal aunt, and maternal uncle; Diabetes in her mother; Healthy in her daughter, son, and son; Heart disease in her brother, sister, sister, and sister; Hypertension in her mother.   ROS:  Please see the history of present illness.   Otherwise, review of systems is positive for none.   All other systems are reviewed and negative.   PHYSICAL EXAM: VS:  BP (!) 152/88   Pulse (!) 152   Temp (!) 97.4 F (36.3 C) (Tympanic)   Ht 5\' 4"  (1.626 m)   Wt 70.8 kg   LMP 03/31/2003   SpO2 100%   BMI 26.78 kg/m  , BMI Body mass index is 26.78 kg/m. GEN: Well nourished, well developed, in no acute distress  HEENT: normal  Neck: no JVD, carotid bruits, or masses Cardiac: RRR; no murmurs, rubs, or gallops,no edema  Respiratory:  clear to auscultation bilaterally, normal work of breathing GI: soft, nontender, nondistended, + BS MS: no deformity or atrophy  Skin: warm and dry  Neuro:  Strength and sensation are intact Psych: euthymic mood, full affect  Recent Labs: 04/21/2020: B Natriuretic Peptide 148.3; TSH 1.424 04/23/2020: Magnesium 2.3 08/06/2020: ALT 13 12/03/2020: BUN 13; Creatinine, Ser 0.64; Hemoglobin 12.2; Platelets 300; Potassium 4.4; Sodium 139    Lipid Panel     Component Value Date/Time   CHOL 191 08/06/2020 0921   TRIG 120 08/06/2020 0921   HDL 79 08/06/2020 0921   CHOLHDL 2.4 08/06/2020 0921   LDLCALC 90 08/06/2020 0921     Wt  Readings from Last 3 Encounters:  12/19/20 70.8 kg  12/17/20 70.8 kg  11/26/20 71.8 kg      Other studies Reviewed: Additional studies/ records that were reviewed today include: TTE 04/21/20  Review of the above records today demonstrates:  1. Left ventricular ejection fraction, by estimation, is 50 to 55%. The  left ventricle has low normal function. The left ventricle demonstrates  global hypokinesis. Left ventricular diastolic parameters are  indeterminate.  2. Right ventricular systolic function is normal. The right ventricular  size is normal. Tricuspid regurgitation signal is inadequate for assessing  PA pressure.  3. The mitral valve is grossly normal. Trivial mitral valve  regurgitation.  4. The aortic valve is tricuspid. Aortic valve regurgitation is not  visualized.  5. The inferior vena cava is normal in size with greater than 50%  respiratory variability, suggesting right atrial pressure of 3 mmHg.  Cardiac monitor 06/17/2020 personally reviewed  Normal sinus rhythm  Paroxysmal AFib with RVR- average HR in Afib 121 bpm. AFib burden of 13%  Rare PVCs and PACs.  Patient symptoms correlate with Afib.   ASSESSMENT AND PLAN:  1.  Paroxysmal atrial fibrillation: Heather Koch has presented today for surgery, with the diagnosis of atrial fibrillation.  The various methods of treatment have been discussed with the patient and family. After consideration of risks, benefits and other options for treatment, the patient has consented to  Procedure(s): Catheter ablation as a surgical intervention .  Risks include but not limited to complete heart block, stroke, esophageal damage, nerve damage, bleeding, vascular damage, tamponade, perforation, MI, and death. The patient's history has been reviewed, patient examined, no change in status, stable for surgery.  I have reviewed the patient's chart and labs.  Questions were answered to the patient's satisfaction.    Heather Tisdel Elberta Fortis,  MD 12/19/2020 7:02 AM

## 2020-12-19 NOTE — Transfer of Care (Signed)
Immediate Anesthesia Transfer of Care Note  Patient: Heather Koch  Procedure(s) Performed: ATRIAL FIBRILLATION ABLATION (N/A )  Patient Location: Cath Lab  Anesthesia Type:General  Level of Consciousness: awake, alert , oriented, patient cooperative and responds to stimulation  Airway & Oxygen Therapy: Patient Spontanous Breathing and Patient connected to nasal cannula oxygen  Post-op Assessment: Report given to RN and Post -op Vital signs reviewed and stable  Post vital signs: Reviewed and stable  Last Vitals:  Vitals Value Taken Time  BP 115/66 12/19/20 0955  Temp    Pulse 89 12/19/20 0955  Resp 18 12/19/20 0955  SpO2 98 % 12/19/20 0955  Vitals shown include unvalidated device data.  Last Pain:  Vitals:   12/19/20 0939  TempSrc:   PainSc: 5       Patients Stated Pain Goal: 3 (12/19/20 0939)  Complications: No complications documented.

## 2020-12-19 NOTE — Discharge Instructions (Signed)
Post procedure care instructions No driving for 4 days. No lifting over 5 lbs for 1 week. No vigorous or sexual activity for 1 week. You may return to work/your usual activities on 12/27/20. Keep procedure site clean & dry. If you notice increased pain, swelling, bleeding or pus, call/return!  You may shower after 24 hours, but no soaking in baths/hot tubs/pools for 1 week.     Cardiac Ablation, Care After  This sheet gives you information about how to care for yourself after your procedure. Your health care provider may also give you more specific instructions. If you have problems or questions, contact your health care provider. What can I expect after the procedure? After the procedure, it is common to have:  Bruising around your puncture site.  Tenderness around your puncture site.  Skipped heartbeats.  Tiredness (fatigue).  Follow these instructions at home: Puncture site care   Follow instructions from your health care provider about how to take care of your puncture site. Make sure you: ? If present, leave stitches (sutures), skin glue, or adhesive strips in place. These skin closures may need to stay in place for up to 2 weeks. If adhesive strip edges start to loosen and curl up, you may trim the loose edges. Do not remove adhesive strips completely unless your health care provider tells you to do that. ? If a square bandage is present, this may be removed in 24 hours.   Check your puncture site every day for signs of infection. Check for: ? Redness, swelling, or pain. ? Fluid or blood. If your puncture site starts to bleed, lie down on your back, apply firm pressure to the area, and contact your health care provider. ? Warmth. ? Pus or a bad smell. Driving  Do not drive for at least 4 days after your procedure or however long your health care provider recommends. (Do not resume driving if you have previously been instructed not to drive for other health reasons.)  Do not  drive or use heavy machinery while taking prescription pain medicine. Activity  Avoid activities that take a lot of effort for at least 7 days after your procedure.  Do not lift anything that is heavier than 5 lb (4.5 kg) for one week.   No sexual activity for 1 week.   Return to your normal activities as told by your health care provider. Ask your health care provider what activities are safe for you. General instructions  Take over-the-counter and prescription medicines only as told by your health care provider.  Do not use any products that contain nicotine or tobacco, such as cigarettes and e-cigarettes. If you need help quitting, ask your health care provider.  You may shower after 24 hours, but Do not take baths, swim, or use a hot tub for 1 week.   Do not drink alcohol for 24 hours after your procedure.  Keep all follow-up visits as told by your health care provider. This is important. Contact a health care provider if:  You have redness, mild swelling, or pain around your puncture site.  You have fluid or blood coming from your puncture site that stops after applying firm pressure to the area.  Your puncture site feels warm to the touch.  You have pus or a bad smell coming from your puncture site.  You have a fever.  You have chest pain or discomfort that spreads to your neck, jaw, or arm.  You are sweating a lot.  You feel  nauseous.  You have a fast or irregular heartbeat.  You have shortness of breath.  You are dizzy or light-headed and feel the need to lie down.  You have pain or numbness in the arm or leg closest to your puncture site. Get help right away if:  Your puncture site suddenly swells.  Your puncture site is bleeding and the bleeding does not stop after applying firm pressure to the area. These symptoms may represent a serious problem that is an emergency. Do not wait to see if the symptoms will go away. Get medical help right away. Call your  local emergency services (911 in the [place] Do not drive yourself to the hospital. Summary  After the procedure, it is normal to have bruising and tenderness at the puncture site in your groin, neck, or forearm.  Check your puncture site every day for signs of infection.  Get help right away if your puncture site is bleeding and the bleeding does not stop after applying firm pressure to the area. This is a medical emergency. This information is not intended to replace advice given to you by your health care provider. Make sure you discuss any questions you have with your health care provider.   You have an appointment set up with the At[facility] Fibrillation Clinic.  Multiple studies have shown that being followed by a dedicated atrial fibrillation clinic in addition to the standard care you receive from your other physicians improves health. We believe that enrollment in the atrial fibrillation clinic will allow [place] to better care for you.   The phone number to the Atrial Fibrillation Clinic is [phone]. The clinic is staffed Monday through Friday from 8:30am to 5pm.  Parking Directions: The clinic is located in the Heart and Vascular Building connected to [facility]. 1)From [address] turn on to [address] and go to the 3rd entrance  (Heart and Vascular entrance) on the right. 2)Look to the right for Heart &Vascular Parking Garage. 3)A code for the entrance is required, for April is 2231.   4)Take the elevators to the 1st floor. Registration is in the room with the glass walls at the end of the hallway.  If you have any trouble parking or locating the clinic, please don't hesitate to call [phone].

## 2020-12-19 NOTE — Anesthesia Procedure Notes (Signed)
Procedure Name: Intubation Date/Time: 12/19/2020 7:37 AM Performed by: Glynda Jaeger, CRNA Pre-anesthesia Checklist: Patient identified, Patient being monitored, Timeout performed, Emergency Drugs available and Suction available Patient Re-evaluated:Patient Re-evaluated prior to induction Oxygen Delivery Method: Circle System Utilized Preoxygenation: Pre-oxygenation with 100% oxygen Induction Type: IV induction Ventilation: Mask ventilation without difficulty Laryngoscope Size: Mac and 4 Grade View: Grade II Tube type: Oral Tube size: 7.5 mm Number of attempts: 1 Airway Equipment and Method: Stylet Placement Confirmation: ETT inserted through vocal cords under direct vision,  positive ETCO2 and breath sounds checked- equal and bilateral Secured at: 21 cm Tube secured with: Tape Dental Injury: Teeth and Oropharynx as per pre-operative assessment

## 2020-12-19 NOTE — Anesthesia Preprocedure Evaluation (Signed)
Anesthesia Evaluation  Patient identified by MRN, date of birth, ID band Patient awake    Reviewed: Allergy & Precautions, H&P , NPO status , Patient's Chart, lab work & pertinent test results  Airway Mallampati: II   Neck ROM: full    Dental   Pulmonary neg pulmonary ROS,    breath sounds clear to auscultation       Cardiovascular + dysrhythmias Atrial Fibrillation  Rhythm:regular Rate:Normal     Neuro/Psych PSYCHIATRIC DISORDERS Anxiety    GI/Hepatic   Endo/Other    Renal/GU      Musculoskeletal  (+) Arthritis ,   Abdominal   Peds  Hematology   Anesthesia Other Findings   Reproductive/Obstetrics                             Anesthesia Physical Anesthesia Plan  ASA: III  Anesthesia Plan: General   Post-op Pain Management:    Induction: Intravenous  PONV Risk Score and Plan: 3 and Ondansetron, Dexamethasone, Midazolam and Treatment may vary due to age or medical condition  Airway Management Planned: Oral ETT  Additional Equipment:   Intra-op Plan:   Post-operative Plan: Extubation in OR  Informed Consent: I have reviewed the patients History and Physical, chart, labs and discussed the procedure including the risks, benefits and alternatives for the proposed anesthesia with the patient or authorized representative who has indicated his/her understanding and acceptance.     Dental advisory given  Plan Discussed with: CRNA, Anesthesiologist and Surgeon  Anesthesia Plan Comments:         Anesthesia Quick Evaluation

## 2020-12-22 ENCOUNTER — Telehealth: Payer: Self-pay | Admitting: Cardiology

## 2020-12-22 ENCOUNTER — Encounter (HOSPITAL_COMMUNITY): Payer: Self-pay | Admitting: Cardiology

## 2020-12-22 NOTE — Telephone Encounter (Signed)
Patient called and wanted to talk with Dr. Elberta Fortis or nurse regarding her HR. Please call back

## 2020-12-22 NOTE — Telephone Encounter (Signed)
Pt usually doesn't know if/when she is in AFib. She asks what are my HRs supposed to be.  Pt educated to normal HRs/ranges. She reports high HRs in the morning when she awakens, before medications.  HRs in the mornings 110s-120s, but will go down by lunchtime/afternoon.  No symptoms at this time. HR currently 91. Pt educated to breakthrough AFib post ablation.  Educated as to what to look for being that pt canNOT feel when she is in/out of AFib. Pt will call back if issues worsen/out of rhythm more than 24/48 hrs. Patient verbalized understanding and agreeable to plan.      MD wanting to start her on Rinvoq 15 mg.  They wanted her to discuss w/ her cardiologist b/c it can affect the heart.  Aware I will discuss this w/ pharmD and let her know.  She is agreeable.

## 2020-12-23 MED FILL — Heparin Sod (Porcine)-NaCl IV Soln 1000 Unit/500ML-0.9%: INTRAVENOUS | Qty: 2500 | Status: AC

## 2020-12-23 NOTE — Telephone Encounter (Signed)
Reviewed w/ pharmD. Increase MACE risk on this medication. Will have Dr. Elberta Fortis review/advise.

## 2020-12-26 NOTE — Telephone Encounter (Signed)
Routing to pharmD. PharmD please give more detailed information on risk of this medication (you mentioned increased MACE risk).  He will review chart after hearing from you. Thanks

## 2020-12-29 NOTE — Telephone Encounter (Signed)
Clinical trial with Heather Koch showed an increased risk of heart attack,stroke, lymphoma, lung cancer, blood clot and death. Although this was seen with Heather Koch and specifically Rinvoq, the FDA has extended the warning to Rinvoq since the share the same mechanism of action.   Rinvoq should not be used if patient is able to take a TNF blocker. I do not see that patient has tried a TNF blocker, nor from the information I see, does she have a contraindication.  Generally the benefits of Rinvoq do outweigh the risks, however patient is at higher risk of stroke due to Afib.     I would prefer to see patient fail a TNF blocker first, unless rheumatology feels as though these are not appropriate.  I will send to [facility] and [place] for their input

## 2020-12-29 NOTE — Telephone Encounter (Signed)
Very useful information. I can only agree with your assessment.  Peter [place] MD, [facility]

## 2020-12-31 NOTE — Anesthesia Postprocedure Evaluation (Signed)
Anesthesia Post Note  Patient: Heather Koch  Procedure(s) Performed: ATRIAL FIBRILLATION ABLATION (N/A )     Patient location during evaluation: PACU Anesthesia Type: General Level of consciousness: awake and alert Pain management: pain level controlled Vital Signs Assessment: post-procedure vital signs reviewed and stable Respiratory status: spontaneous breathing, nonlabored ventilation, respiratory function stable and patient connected to nasal cannula oxygen Cardiovascular status: blood pressure returned to baseline and stable Postop Assessment: no apparent nausea or vomiting Anesthetic complications: no   No complications documented.  Last Vitals:  Vitals:   12/19/20 1200 12/19/20 1300  BP: (!) 101/57 106/64  Pulse: 79 78  Resp: 14 20  Temp:    SpO2: 93% 95%    Last Pain:  Vitals:   12/22/20 1006  TempSrc:   PainSc: 0-No pain                 [name] S

## 2021-01-02 NOTE — Telephone Encounter (Signed)
Agree with information below as well. If she is having more AF needs to see AF clinic.

## 2021-01-05 NOTE — Telephone Encounter (Signed)
Pt advised of pharmD & MD recommendation. Aware I will forward to Dr. Barbee Cough for his review//FYI. Pt advised to discuss options further w/ rheumatology. Pt aware to let us know if further AFib, she reports no issues currently. Patient verbalized understanding and agreeable to plan.

## 2021-01-10 ENCOUNTER — Telehealth: Payer: Self-pay | Admitting: Cardiology

## 2021-01-10 NOTE — Telephone Encounter (Signed)
Attempted to call back for on call page. No answer. VM left.

## 2021-01-21 ENCOUNTER — Other Ambulatory Visit: Payer: Self-pay

## 2021-01-21 ENCOUNTER — Ambulatory Visit (HOSPITAL_COMMUNITY)
Admission: RE | Admit: 2021-01-21 | Discharge: 2021-01-21 | Disposition: A | Discharge status: Home / Self Care | Payer: Medicare Other | Source: Ambulatory Visit | Attending: Nurse Practitioner | Admitting: Nurse Practitioner

## 2021-01-21 ENCOUNTER — Encounter (HOSPITAL_COMMUNITY): Payer: Self-pay | Admitting: Nurse Practitioner

## 2021-01-21 VITALS — BP 118/72 | HR 84 | Ht 64.0 in | Wt 156.6 lb

## 2021-01-21 DIAGNOSIS — D6869 Other thrombophilia: Secondary | ICD-10-CM | POA: Diagnosis not present

## 2021-01-21 DIAGNOSIS — I480 Paroxysmal atrial fibrillation: Secondary | ICD-10-CM | POA: Diagnosis not present

## 2021-01-21 DIAGNOSIS — Z79899 Other long term (current) drug therapy: Secondary | ICD-10-CM | POA: Insufficient documentation

## 2021-01-21 DIAGNOSIS — M069 Rheumatoid arthritis, unspecified: Secondary | ICD-10-CM | POA: Insufficient documentation

## 2021-01-21 DIAGNOSIS — Z8249 Family history of ischemic heart disease and other diseases of the circulatory system: Secondary | ICD-10-CM | POA: Diagnosis not present

## 2021-01-21 DIAGNOSIS — Z7901 Long term (current) use of anticoagulants: Secondary | ICD-10-CM | POA: Insufficient documentation

## 2021-01-21 DIAGNOSIS — I4891 Unspecified atrial fibrillation: Secondary | ICD-10-CM | POA: Diagnosis not present

## 2021-01-21 NOTE — Progress Notes (Signed)
Primary Care Physician: Burton Apley, MD Referring Physician: Dr. Elberta Fortis (EP)  Cardiologist: Dr. [place]    Heather Koch is a 70 y.o. female with a h/o rheumatoid arthritis, and recent afib ablation by Dr. Elberta Fortis 12/20/19. She is in SR today. No swallowing or groin issues. She has felt some palpitations but nothing sustained. Failed flecainide prior to ablation.   Today, she denies symptoms of palpitations, chest pain, shortness of breath, orthopnea, PND, lower extremity edema, dizziness, presyncope, syncope, or neurologic sequela. The patient is tolerating medications without difficulties and is otherwise without complaint today.   Past Medical History:  Diagnosis Date  . ASCUS (atypical squamous cells of undetermined significance) on Pap smear 12/2008   neg HR HPV  . LGSIL (low grade squamous intraepithelial dysplasia) 2005/2006  . Lichen sclerosus 2012  . Rheumatoid arthritis(714.0) 2012   Past Surgical History:  Procedure Laterality Date  . ATRIAL FIBRILLATION ABLATION N/A 12/19/2020   Procedure: ATRIAL FIBRILLATION ABLATION;  Surgeon: Regan Lemming, MD;  Location: [facility] INVASIVE CV LAB;  Service: Cardiovascular;  Laterality: N/A;  . TUBAL LIGATION  1997    Current Outpatient Medications  Medication Sig Dispense Refill  . acetaminophen (TYLENOL) 500 MG tablet Take 1,000 mg by mouth 2 (two) times daily.    Marland Kitchen ALPRAZolam (XANAX) 1 MG tablet Take 0.5-1 mg by mouth at bedtime as needed for anxiety or sleep.    . Cholecalciferol (VITAMIN D) 50 MCG (2000 UT) tablet Take 2,000 Units by mouth daily.    . clobetasol cream (TEMOVATE) 0.05 % APPLY TOPICALLY AT BEDTIME AS NEEDED FOR ITCHING 30 g 1  . diclofenac sodium (VOLTAREN) 1 % GEL Apply 3 gm to 3 large joints up to 3 times a day.Dispense 3 tubes with 3 refills. 3 Tube 1  . diltiazem (CARDIZEM CD) 240 MG 24 hr capsule Take 1 capsule (240 mg total) by mouth daily. 90 capsule 3  . diltiazem (CARDIZEM) 30 MG tablet Take 1 tablet (30  mg total) by mouth every 6 (six) hours as needed (for elevated heart rates). 30 tablet 2  . ELIQUIS 5 MG TABS tablet TAKE 1 TABLET BY MOUTH  TWICE DAILY 180 tablet 3  . Multiple Vitamin (MULTIVITAMIN PO) Take 1 tablet by mouth daily.     . simvastatin (ZOCOR) 20 MG tablet Take 20 mg by mouth daily.    Marland Kitchen RINVOQ 15 MG TB24 Take 15 mg by mouth daily. (Patient not taking: Reported on 01/21/2021)     No current facility-administered medications for this encounter.    Allergies  Allergen Reactions  . Fish Allergy Other (See Comments)    [place]  Reaction: Syncope    Social History   Socioeconomic History  . Marital status: Married    Spouse name: Not on file  . Number of children: Not on file  . Years of education: Not on file  . Highest education level: Not on file  Occupational History  . Not on file  Tobacco Use  . Smoking status: Never Smoker  . Smokeless tobacco: Never Used  Vaping Use  . Vaping Use: Never used  Substance and Sexual Activity  . Alcohol use: No  . Drug use: No  . Sexual activity: Yes    Partners: Male    Birth control/protection: Post-menopausal  Other Topics Concern  . Not on file  Social History Narrative  . Not on file   Social Determinants of Health   Financial Resource Strain: Not on file  Food Insecurity:  Not on file  Transportation Needs: Not on file  Physical Activity: Not on file  Stress: Not on file  Social Connections: Not on file  Intimate Partner Violence: Not on file    Family History  Problem Relation Age of Onset  . Hypertension Mother   . Diabetes Mother   . Cancer Father        liver  . Heart disease Sister   . Aneurysm Sister   . Cancer Brother        pancreatic cancer  . Cancer Maternal Aunt        throat  . Heart disease Sister   . Heart disease Sister   . Cancer Maternal Uncle        Colon  . Heart disease Brother        pacemaker  . Healthy Son   . Healthy Son   . Healthy Daughter     ROS- All systems are  reviewed and negative except as per the HPI above  Physical Exam: There were no vitals filed for this visit. Wt Readings from Last 3 Encounters:  12/19/20 70.8 kg  12/17/20 70.8 kg  11/26/20 71.8 kg    Labs: Lab Results  Component Value Date   NA 139 12/03/2020   K 4.4 12/03/2020   CL 101 12/03/2020   CO2 23 12/03/2020   GLUCOSE 97 12/03/2020   BUN 13 12/03/2020   CREATININE 0.64 12/03/2020   CALCIUM 9.8 12/03/2020   PHOS 4.0 04/23/2020   MG 2.3 04/23/2020   Lab Results  Component Value Date   INR 1.1 04/20/2020   Lab Results  Component Value Date   CHOL 191 08/06/2020   HDL 79 08/06/2020   LDLCALC 90 08/06/2020   TRIG 120 08/06/2020     GEN- The patient is well appearing, alert and oriented x 3 today.   Head- normocephalic, atraumatic Eyes-  Sclera clear, conjunctiva pink Ears- hearing intact Oropharynx- clear Neck- supple, no JVP Lymph- no cervical lymphadenopathy Lungs- Clear to ausculation bilaterally, normal work of breathing Heart- Regular rate and rhythm, no murmurs, rubs or gallops, PMI not laterally displaced GI- soft, NT, ND, + BS Extremities- no clubbing, cyanosis, or edema MS- no significant deformity or atrophy Skin- no rash or lesion Psych- euthymic mood, full affect Neuro- strength and sensation are intact  EKG-NSR at 84 bpm, pr int 152 ms, qrs int 78 ms, qtc 425 ms     Assessment and Plan: 1. AFIB S/p ablation one month ago In SR today and pt does not report  any sustained  afib Continue Caredizem 240 mg daily   2. CHA2DS2VASc score of 2 Continue eliquis without interruption for the 3 month healing period    F/u with Dr. Elberta Fortis 6/6  Elvina Sidle. [facility] [address] [place], [place] [address] [phone]

## 2021-01-22 ENCOUNTER — Ambulatory Visit: Payer: Medicare Other | Admitting: Cardiology

## 2021-02-09 ENCOUNTER — Telehealth: Payer: Self-pay | Admitting: Cardiology

## 2021-02-09 NOTE — Telephone Encounter (Signed)
   [facility] Pre-operative Risk Assessment    Patient Name: Heather Koch  DOB: 17-Sep-1951  MRN: [id number]   HEARTCARE STAFF: - Please ensure there is not already an duplicate clearance open for this procedure. - Under Visit Info/Reason for Call, type in Other and utilize the format Clearance MM/DD/YY or Clearance TBD. Do not use dashes or single digits. - If request is for dental extraction, please clarify the # of teeth to be extracted.  Request for surgical clearance:  1. What type of surgery is being performed? Teeth cleaning  2. When is this surgery scheduled? 02/17/21  3. What type of clearance is required (medical clearance vs. Pharmacy clearance to hold med vs. Both)? both  4. Are there any medications that need to be held prior to surgery and how long? Eliquis, up to [place].   5. Practice name and name of physician performing surgery? [facility]   6. What is the office phone number? [phone]   7.   What is the office fax number? [phone]  8.   Anesthesia type (None, local, MAC, general) ? none   Leah Newnam 02/09/2021, 12:55 PM  _________________________________________________________________   (provider comments below)

## 2021-02-10 NOTE — Telephone Encounter (Signed)
   Patient Name: Heather Koch  DOB: 05-Jun-1951  MRN: [id number]   Primary Cardiologist: Peter [place], MD  Chart reviewed as part of pre-operative protocol coverage.   Simple 1-2 teeth extractions or dental cleaning are considered low risk procedures per guidelines and generally do not require any specific cardiac clearance. It is also generally accepted that for simple extractions and dental cleanings, there is no need to interrupt blood thinner therapy.   SBE prophylaxis is not required for the patient from a cardiac standpoint.  I will route this recommendation to the requesting party via Epic fax function and remove from pre-op pool.  Please call with questions.  [place], [place] 02/10/2021, 2:22 PM

## 2021-02-24 ENCOUNTER — Other Ambulatory Visit: Payer: Self-pay

## 2021-02-24 ENCOUNTER — Encounter: Payer: Self-pay | Admitting: Nurse Practitioner

## 2021-02-24 ENCOUNTER — Ambulatory Visit (INDEPENDENT_AMBULATORY_CARE_PROVIDER_SITE_OTHER): Payer: Medicare Other | Admitting: Nurse Practitioner

## 2021-02-24 VITALS — BP 110/70 | HR 106 | Resp 16 | Ht 63.0 in | Wt 157.0 lb

## 2021-02-24 DIAGNOSIS — L900 Lichen sclerosus et atrophicus: Secondary | ICD-10-CM

## 2021-02-24 DIAGNOSIS — Z01419 Encounter for gynecological examination (general) (routine) without abnormal findings: Secondary | ICD-10-CM

## 2021-02-24 DIAGNOSIS — M810 Age-related osteoporosis without current pathological fracture: Secondary | ICD-10-CM

## 2021-02-24 NOTE — Progress Notes (Signed)
   Heather Koch Jul 03, 1967 [id number]   History:  70 y.o. G3P3 presents for breast and pelvic exam without GYN complaints. Postmenopausal - no HRT, no bleeding. 2006 LGSIL, subsequent paps normal. RA and osteoporosis managed by rheumatology. Normal mammogram history. History of ILD, cardiac ablation 12/2020 for a fib - on Cardizem and Eliquis, LS managed with clobetasol as needed with rare exacerbations.   Gynecologic History Patient's last menstrual period was 03/31/2003.   Contraception/Family planning: post menopausal status   Health Maintenance Last Pap: 12/21/2016. Results were: normal Last mammogram: 08/20/2020. Results were: normal Last colonoscopy: 2017. Results were: polyps, 5-year repeat Last Dexa: 08/17/2019. Results were: T-score -3.2  Past medical history, past surgical history, family history and social history were all reviewed and documented in the EPIC chart. Married. 2 sons, 1 daughter, 5 grandchildren.   ROS:  A ROS was performed and pertinent positives and negatives are included.  Exam:  Vitals:   02/24/21 1334  BP: 110/70  Pulse: (!) 106  Resp: 16  Weight: 157 lb (71.2 kg)  Height: 5\' 3"  (1.6 m)   Body mass index is 27.81 kg/m.  General appearance:  Normal Thyroid:  Symmetrical, normal in size, without palpable masses or nodularity. Respiratory  Auscultation:  Mild crackles and wheezing in bilateral lower lobes Cardiovascular  Auscultation:  Tachycardia, without rubs, murmurs or gallops  Edema/varicosities:  Not grossly evident Abdominal  Soft,nontender, without masses, guarding or rebound.  Liver/spleen:  No organomegaly noted  Hernia:  None appreciated  Skin  Inspection:  Grossly normal Breasts: Examined lying and sitting.   Right: Without masses, retractions, nipple discharge or axillary adenopathy.   Left: Without masses, retractions, nipple discharge or axillary adenopathy. Genitourinary   Inguinal/mons:  Normal without inguinal  adenopathy  External genitalia:  Normal appearing vulva with no masses, tenderness, or lesions  BUS/Urethra/Skene's glands:  Normal  Vagina:  Normal appearing with normal color and discharge, no lesions. Atrophic changes  Cervix:  Normal appearing without discharge or lesions  Uterus:  Normal in size, shape and contour.  Midline and mobile, nontender  Adnexa/parametria:     Rt: Normal in size, without masses or tenderness.   Lt: Normal in size, without masses or tenderness.  Anus and perineum: Normal  Digital rectal exam: Normal sphincter tone without palpated masses or tenderness  Assessment/Plan:  70 y.o. G3P3 for annual exam.   Well female exam with routine gynecological exam - Education provided on SBEs, importance of preventative screenings, current guidelines, high calcium diet, regular exercise, and multivitamin daily. Labs done elsewhere.   Age-related osteoporosis without current pathological fracture - managed by rheumatology. 07/2019 T-score -3.2, taking Vitamin D and calcium daily.   Lichen sclerosus - rare exacerbations, using Clobetasol as needed.   Screening for cervical cancer - Normal Pap history.  Discussed the option to stop screening per guidelines. She is agreeable and we will reassess on an annual basis.   Screening for breast cancer - Normal mammogram history.  Continue annual screenings.  Normal breast exam today.  Screening for colon cancer - 2017 colonoscopy. She plans to schedule this soon as she is just now due for this.   Return in 1 year for annual.    2018 DNP, 2:21 PM 02/24/2021

## 2021-02-24 NOTE — Patient Instructions (Signed)
Health Maintenance After Age 69 After age 69, you are at a higher risk for certain long-term diseases and infections as well as injuries from falls. Falls are a major cause of broken bones and head injuries in people who are older than age 69. Getting regular preventive care can help to keep you healthy and well. Preventive care includes getting regular testing and making lifestyle changes as recommended by your health care provider. Talk with your health care provider about:  Which screenings and tests you should have. A screening is a test that checks for a disease when you have no symptoms.  A diet and exercise plan that is right for you. What should I know about screenings and tests to prevent falls? Screening and testing are the best ways to find a health problem early. Early diagnosis and treatment give you the best chance of managing medical conditions that are common after age 69. Certain conditions and lifestyle choices may make you more likely to have a fall. Your health care provider may recommend:  Regular vision checks. Poor vision and conditions such as cataracts can make you more likely to have a fall. If you wear glasses, make sure to get your prescription updated if your vision changes.  Medicine review. Work with your health care provider to regularly review all of the medicines you are taking, including over-the-counter medicines. Ask your health care provider about any side effects that may make you more likely to have a fall. Tell your health care provider if any medicines that you take make you feel dizzy or sleepy.  Osteoporosis screening. Osteoporosis is a condition that causes the bones to get weaker. This can make the bones weak and cause them to break more easily.  Blood pressure screening. Blood pressure changes and medicines to control blood pressure can make you feel dizzy.  Strength and balance checks. Your health care provider may recommend certain tests to check your  strength and balance while standing, walking, or changing positions.  Foot health exam. Foot pain and numbness, as well as not wearing proper footwear, can make you more likely to have a fall.  Depression screening. You may be more likely to have a fall if you have a fear of falling, feel emotionally low, or feel unable to do activities that you used to do.  Alcohol use screening. Using too much alcohol can affect your balance and may make you more likely to have a fall. What actions can I take to lower my risk of falls? General instructions  Talk with your health care provider about your risks for falling. Tell your health care provider if: ? You fall. Be sure to tell your health care provider about all falls, even ones that seem minor. ? You feel dizzy, sleepy, or off-balance.  Take over-the-counter and prescription medicines only as told by your health care provider. These include any supplements.  Eat a healthy diet and maintain a healthy weight. A healthy diet includes low-fat dairy products, low-fat (lean) meats, and fiber from whole grains, beans, and lots of fruits and vegetables. Home safety  Remove any tripping hazards, such as rugs, cords, and clutter.  Install safety equipment such as grab bars in bathrooms and safety rails on stairs.  Keep rooms and walkways well-lit. Activity  Follow a regular exercise program to stay fit. This will help you maintain your balance. Ask your health care provider what types of exercise are appropriate for you.  If you need a cane or walker,   use it as recommended by your health care provider.  Wear supportive shoes that have nonskid soles.   Lifestyle  Do not drink alcohol if your health care provider tells you not to drink.  If you drink alcohol, limit how much you have: ? 0-1 drink a day for women. ? 0-2 drinks a day for men.  Be aware of how much alcohol is in your drink. In the [place] one drink equals one typical bottle of beer (12  oz), one-half glass of wine (5 oz), or one shot of hard liquor (1 oz).  Do not use any products that contain nicotine or tobacco, such as cigarettes and e-cigarettes. If you need help quitting, ask your health care provider. Summary  Having a healthy lifestyle and getting preventive care can help to protect your health and wellness after age 69.  Screening and testing are the best way to find a health problem early and help you avoid having a fall. Early diagnosis and treatment give you the best chance for managing medical conditions that are more common for people who are older than age 69.  Falls are a major cause of broken bones and head injuries in people who are older than age 69. Take precautions to prevent a fall at home.  Work with your health care provider to learn what changes you can make to improve your health and wellness and to prevent falls. This information is not intended to replace advice given to you by your health care provider. Make sure you discuss any questions you have with your health care provider. Document Revised: 01/25/2019 Document Reviewed: 08/17/2017 Elsevier Patient Education  2021 Elsevier Inc.  

## 2021-03-23 ENCOUNTER — Ambulatory Visit: Payer: Medicare Other | Admitting: Cardiology

## 2021-03-23 ENCOUNTER — Encounter: Payer: Self-pay | Admitting: Cardiology

## 2021-03-23 ENCOUNTER — Encounter (INDEPENDENT_AMBULATORY_CARE_PROVIDER_SITE_OTHER): Payer: Self-pay

## 2021-03-23 ENCOUNTER — Other Ambulatory Visit: Payer: Self-pay

## 2021-03-23 VITALS — BP 126/70 | HR 78 | Ht 63.0 in | Wt 157.0 lb

## 2021-03-23 DIAGNOSIS — I4819 Other persistent atrial fibrillation: Secondary | ICD-10-CM

## 2021-03-23 NOTE — Patient Instructions (Signed)
Medication Instructions:  Your physician recommends that you continue on your current medications as directed. Please refer to the Current Medication list given to you today.  *If you need a refill on your cardiac medications before your next appointment, please call your pharmacy*   Lab Work: None ordered   Testing/Procedures: None ordered   Follow-Up: At [facility], you and your health needs are our priority.  As part of our continuing mission to provide you with exceptional heart care, we have created designated Provider Care Teams.  These Care Teams include your primary Cardiologist (physician) and Advanced Practice Providers (APPs -  Physician Assistants and Nurse Practitioners) who all work together to provide you with the care you need, when you need it.  We recommend signing up for the patient portal called "MyChart".  Sign up information is provided on this After Visit Summary.  MyChart is used to connect with patients for Virtual Visits (Telemedicine).  Patients are able to view lab/test results, encounter notes, upcoming appointments, etc.  Non-urgent messages can be sent to your provider as well.   To learn more about what you can do with MyChart, go to [email or web address].    Your next appointment:   3 month(s)  The format for your next appointment:   In Person  Provider:   Will Camnitz, MD    Thank you for choosing [facility]!!   Zeanna Sunde, RN [phone]    

## 2021-03-23 NOTE — Progress Notes (Signed)
Electrophysiology Office Note   Date:  03/23/2021   ID:  Heather Koch, DOB June 27, 1951, MRN [id number]  PCP:  Burton Apley, MD  Cardiologist:  [place] Primary Electrophysiologist:  Regan Lemming, MD    Chief Complaint: AF   History of Present Illness: Heather Koch is a 70 y.o. female who is being seen today for the evaluation of AF at the request of Burton Apley, MD. Presenting today for electrophysiology evaluation.  She has a history significant for rheumatoid arthritis on methotrexate.  She was admitted July 2021 with acute respiratory infection and hypoxia.  She was in atrial fibrillation during that hospital stay.  She wore a cardiac monitor showed an 18% atrial fibrillation burden.  Chest CT showed evidence of ILD.  She presented to cardiology clinic with more frequent episodes of atrial fibrillation lasting up to 3 hours at a time.  Heart rates were in the 140s to 150s.  Her metoprolol was not helping her symptoms.  She is now status post atrial fibrillation ablation on 12/19/2020.  Today, denies symptoms of palpitations, chest pain, shortness of breath, orthopnea, PND, lower extremity edema, claudication, dizziness, presyncope, syncope, bleeding, or neurologic sequela. The patient is tolerating medications without difficulties.  Since her ablation she has done well.  She has had minimal episodes of atrial fibrillation.  She is getting her strength back.  She is able to do all of her daily activities without restriction.   Past Medical History:  Diagnosis Date  . ASCUS (atypical squamous cells of undetermined significance) on Pap smear 12/2008   neg HR HPV  . LGSIL (low grade squamous intraepithelial dysplasia) 2005/2006  . Lichen sclerosus 2012  . Pneumonia   . Rheumatoid arthritis(714.0) 2012   Past Surgical History:  Procedure Laterality Date  . ATRIAL FIBRILLATION ABLATION N/A 12/19/2020   Procedure: ATRIAL FIBRILLATION ABLATION;  Surgeon: Regan Lemming, MD;   [facility];  Service: Cardiovascular;  Laterality: N/A;  . TUBAL LIGATION  1997     Current Outpatient Medications  Medication Sig Dispense Refill  . acetaminophen (TYLENOL) 500 MG tablet Take 1,000 mg by mouth 2 (two) times daily.    Marland Kitchen ALPRAZolam (XANAX) 1 MG tablet Take 0.5-1 mg by mouth at bedtime as needed for anxiety or sleep.    . Cholecalciferol (VITAMIN D) 50 MCG (2000 UT) tablet Take 2,000 Units by mouth daily.    . clobetasol cream (TEMOVATE) 0.05 % APPLY TOPICALLY AT BEDTIME AS NEEDED FOR ITCHING 30 g 1  . diclofenac sodium (VOLTAREN) 1 % GEL Apply 3 gm to 3 large joints up to 3 times a day.Dispense 3 tubes with 3 refills. 3 Tube 1  . diltiazem (CARDIZEM CD) 240 MG 24 hr capsule Take 1 capsule (240 mg total) by mouth daily. 90 capsule 3  . diltiazem (CARDIZEM) 30 MG tablet Take 1 tablet (30 mg total) by mouth every 6 (six) hours as needed (for elevated heart rates). 30 tablet 2  . ELIQUIS 5 MG TABS tablet TAKE 1 TABLET BY MOUTH  TWICE DAILY 180 tablet 3  . Multiple Vitamin (MULTIVITAMIN PO) Take 1 tablet by mouth daily.     . simvastatin (ZOCOR) 20 MG tablet Take 20 mg by mouth daily.     No current facility-administered medications for this visit.    Allergies:   Fish allergy   Social History:  The patient  reports that she has never smoked. She has never used smokeless tobacco. She reports that she  does not drink alcohol and does not use drugs.   Family History:  The patient's family history includes Aneurysm in her sister; Cancer in her brother, father, maternal aunt, and maternal uncle; Diabetes in her mother; Healthy in her daughter, son, and son; Heart disease in her brother, sister, sister, and sister; Hypertension in her mother.   ROS:  Please see the history of present illness.   Otherwise, review of systems is positive for none.   All other systems are reviewed and negative.   PHYSICAL EXAM: VS:  BP 126/70   Pulse 78   Ht 5\' 3"  (1.6 m)   Wt 157  lb (71.2 kg)   LMP 03/31/2003   BMI 27.81 kg/m  , BMI Body mass index is 27.81 kg/m. GEN: Well nourished, well developed, in no acute distress  HEENT: normal  Neck: no JVD, carotid bruits, or masses Cardiac: RRR; no murmurs, rubs, or gallops,no edema  Respiratory:  clear to auscultation bilaterally, normal work of breathing GI: soft, nontender, nondistended, + BS MS: no deformity or atrophy  Skin: warm and dry Neuro:  Strength and sensation are intact Psych: euthymic mood, full affect  EKG:  EKG is ordered today. Personal review of the ekg ordered shows sinus rhythm  Recent Labs: 04/21/2020: B Natriuretic Peptide 148.3; TSH 1.424 04/23/2020: Magnesium 2.3 08/06/2020: ALT 13 12/03/2020: BUN 13; Creatinine, Ser 0.64; Hemoglobin 12.2; Platelets 300; Potassium 4.4; Sodium 139    Lipid Panel     Component Value Date/Time   CHOL 191 08/06/2020 0921   TRIG 120 08/06/2020 0921   HDL 79 08/06/2020 0921   CHOLHDL 2.4 08/06/2020 0921   LDLCALC 90 08/06/2020 0921     Wt Readings from Last 3 Encounters:  03/23/21 157 lb (71.2 kg)  02/24/21 157 lb (71.2 kg)  01/21/21 156 lb 9.6 oz (71 kg)      Other studies Reviewed: Additional studies/ records that were reviewed today include: TTE 04/21/20  Review of the above records today demonstrates:  1. Left ventricular ejection fraction, by estimation, is 50 to 55%. The  left ventricle has low normal function. The left ventricle demonstrates  global hypokinesis. Left ventricular diastolic parameters are  indeterminate.  2. Right ventricular systolic function is normal. The right ventricular  size is normal. Tricuspid regurgitation signal is inadequate for assessing  PA pressure.  3. The mitral valve is grossly normal. Trivial mitral valve  regurgitation.  4. The aortic valve is tricuspid. Aortic valve regurgitation is not  visualized.  5. The inferior vena cava is normal in size with greater than 50%  respiratory variability,  suggesting right atrial pressure of 3 mmHg.  Cardiac monitor 06/17/2020 personally reviewed  Normal sinus rhythm  Paroxysmal AFib with RVR- average HR in Afib 121 bpm. AFib burden of 13%  Rare PVCs and PACs.  Patient symptoms correlate with Afib.   ASSESSMENT AND PLAN:  1.  Persistent atrial fibrillation: Currently on Cardizem and Eliquis.  CHA2DS2-VASc of 2.  Status post ablation 12/19/2020.  She is remained in sinus rhythm.  We Eric Morganti continue with current management.  2.  Coronary artery calcifications: Noted mid LAD without angina.  Continue statin per primary cardiology.    3.  Rheumatoid arthritis: Continue methotrexate per rheumatology  4.  Interstitial lung disease: Followed by pulmonary at [facility]   Current medicines are reviewed at length with the patient today.   The patient does not have concerns regarding her medicines.  The following changes were made today: None  Labs/ tests ordered today include:  Orders Placed This Encounter  Procedures  . EKG 12-Lead     Disposition:   FU with Marlowe Lawes 3 months  Signed, Naomi Castrogiovanni Jorja Loa, MD  03/23/2021 2:30 PM     [facility] [address] [place] [address] [phone] (office) [phone] (fax)

## 2021-03-23 NOTE — Progress Notes (Signed)
Primary Care Physician: Burton Apley, MD Referring Physician:Luke Diona Fanti, [place] Cardiologist: Dr. [place]    Heather Koch is a 70 y.o. female with a  h/o rheumatoid arthritis on methotrexate. She was admitted  in July 2021 for acute respiratory infection with hypoxia. She had an episode of afib  during this hospitalization,short in duration and self converted.She was started on eliquis with a CHA2DS2VASc of 2, and cardizem for rate control.. She has a history of palpitations for years.  Event monitor did show afib burden of 18%, no sustained afib. CT of chest showed  of ILD. She was seen by Dr Delton Coombes  who noted significant improvement in CT findings.  She was seen in the Afib clinic in follow up and options for AAD therapy were discussed. Not a candidate for amiodarone due to ILD. Could consider Tikosyn or Multaq. Patient was Ok with current AFib burden and was managed with rate control and anticoagulation. Has coronary calcification noted on CT.   When I last saw her she was having more Afib. Last week it was fairly continuous off and on for several days. Last night she had an episode lasting 3 hours. HR typically in 140-150 range on pulse ox. Takes metoprolol but doesn't really seem to help. Feels really bad when in Afib. No dizziness or syncope. Sometimes HR noted to be as low as 43.   She was seen by Dr Elberta Fortis and underwent Afib ablation on 12/19/20.   She is now being seen at [facility] by pulmonary and Rheumatology. Has ILD. Planned follow up PFTs and CT done in January. There was no change in CT compared to September. PFTs showed mild restriction with no obstruction and normal diffusion capacity. She has recent flair of RA in right shoulder and wrist and is on steroids now. Is being considered for Rinvoq.   Since ablation she has noted only 2 brief episodes of AFib. Feels well. Is interested in limiting medication in long run.  Past Medical History:  Diagnosis Date   ASCUS (atypical  squamous cells of undetermined significance) on Pap smear 12/2008   neg HR HPV   LGSIL (low grade squamous intraepithelial dysplasia) 2005/2006   Lichen sclerosus 2012   Pneumonia    Rheumatoid arthritis(714.0) 2012   Past Surgical History:  Procedure Laterality Date   ATRIAL FIBRILLATION ABLATION N/A 12/19/2020   Procedure: ATRIAL FIBRILLATION ABLATION;  Surgeon: Regan Lemming, MD;  Location: [facility];  Service: Cardiovascular;  Laterality: N/A;   TUBAL LIGATION  1997    Current Outpatient Medications  Medication Sig Dispense Refill   acetaminophen (TYLENOL) 500 MG tablet Take 1,000 mg by mouth 2 (two) times daily.     ALPRAZolam (XANAX) 1 MG tablet Take 0.5-1 mg by mouth at bedtime as needed for anxiety or sleep.     Cholecalciferol (VITAMIN D) 50 MCG (2000 UT) tablet Take 2,000 Units by mouth daily.     clobetasol cream (TEMOVATE) 0.05 % APPLY TOPICALLY AT BEDTIME AS NEEDED FOR ITCHING 30 g 1   diclofenac sodium (VOLTAREN) 1 % GEL Apply 3 gm to 3 large joints up to 3 times a day.Dispense 3 tubes with 3 refills. 3 Tube 1   diltiazem (CARDIZEM CD) 240 MG 24 hr capsule Take 1 capsule (240 mg total) by mouth daily. 90 capsule 3   diltiazem (CARDIZEM) 30 MG tablet Take 1 tablet (30 mg total) by mouth every 6 (six) hours as needed (for elevated heart rates). 30  tablet 2   ELIQUIS 5 MG TABS tablet TAKE 1 TABLET BY MOUTH  TWICE DAILY 180 tablet 3   Multiple Vitamin (MULTIVITAMIN PO) Take 1 tablet by mouth daily.      simvastatin (ZOCOR) 20 MG tablet Take 20 mg by mouth daily.     No current facility-administered medications for this visit.    Allergies  Allergen Reactions   Fish Allergy Other (See Comments)    Scallops  Reaction: Syncope    Social History   Socioeconomic History   Marital status: Married    Spouse name: Not on file   Number of children: Not on file   Years of education: Not on file   Highest education level: Not on file  Occupational History    Not on file  Tobacco Use   Smoking status: Never Smoker   Smokeless tobacco: Never Used  Vaping Use   Vaping Use: Never used  Substance and Sexual Activity   Alcohol use: No   Drug use: Never   Sexual activity: Yes    Partners: Male    Birth control/protection: Post-menopausal  Other Topics Concern   Not on file  Social History Narrative   Not on file   Social Determinants of Health   Financial Resource Strain: Not on file  Food Insecurity: Not on file  Transportation Needs: Not on file  Physical Activity: Not on file  Stress: Not on file  Social Connections: Not on file  Intimate Partner Violence: Not on file    Family History  Problem Relation Age of Onset   Hypertension Mother    Diabetes Mother    Cancer Father        liver   Heart disease Sister    Aneurysm Sister    Cancer Brother        pancreatic cancer   Cancer Maternal Aunt        throat   Heart disease Sister    Heart disease Sister    Cancer Maternal Uncle        Colon   Heart disease Brother        pacemaker   Healthy Son    Healthy Son    Healthy Daughter     ROS- All systems are reviewed and negative except as per the HPI above  Physical Exam: There were no vitals filed for this visit. Wt Readings from Last 3 Encounters:  02/24/21 157 lb (71.2 kg)  01/21/21 156 lb 9.6 oz (71 kg)  12/19/20 156 lb (70.8 kg)    Labs: Lab Results  Component Value Date   NA 139 12/03/2020   K 4.4 12/03/2020   CL 101 12/03/2020   CO2 23 12/03/2020   GLUCOSE 97 12/03/2020   BUN 13 12/03/2020   CREATININE 0.64 12/03/2020   CALCIUM 9.8 12/03/2020   PHOS 4.0 04/23/2020   MG 2.3 04/23/2020   Lab Results  Component Value Date   INR 1.1 04/20/2020   Lab Results  Component Value Date   CHOL 191 08/06/2020   HDL 79 08/06/2020   LDLCALC 90 08/06/2020   TRIG 120 08/06/2020   LMP 03/31/2003    GEN- The patient is well appearing, alert and oriented x 3 today.   Head- normocephalic,  atraumatic Eyes-  Sclera clear, conjunctiva pink Ears- hearing intact Oropharynx- clear Neck- supple, no JVP Lymph- no cervical lymphadenopathy Lungs- crackles bilaterally,  normal work of breathing Heart- Regular rate and rhythm, no murmurs, rubs or gallops, PMI not laterally  displaced GI- soft, NT, ND, + BS Extremities- no clubbing, cyanosis, or edema MS- no significant deformity or atrophy Skin- no rash or lesion Psych- euthymic mood, full affect Neuro- strength and sensation are intact   Echo7/5/21  1. Left ventricular ejection fraction, by estimation, is 50 to 55%. The left ventricle has low normal function. The left ventricle demonstrates global hypokinesis. Left ventricular diastolic parameters are indeterminate. 2. Right ventricular systolic function is normal. The right ventricular size is normal. Tricuspid regurgitation signal is inadequate for assessing PA pressure. 3. The mitral valve is grossly normal. Trivial mitral valve regurgitation. 4. The aortic valve is tricuspid. Aortic valve regurgitation is not visualized. 5. The inferior vena cava is normal in size with greater than 50% respiratory variability, suggesting right atrial pressure of 3 mmHg.  Event monitor 06/17/20: Study Highlights  Normal sinus rhythm Paroxysmal AFib with RVR- average HR in Afib 121 bpm. AFib burden of 13% Rare PVCs and PACs. Patient symptoms correlate with Afib  Assessment and Plan: 1. Paroxysmal afib- on rate control with Cardizem CD and prn metoprolol. Now s/p ablation on 12/19/20. Doing well.  On Eliquis for anticoagulation- patient is interesting is stopping this eventually. Will defer to Dr Elberta Fortis.   2. CHA2DS2VASc score of 2 Continue eliquis 5 mg bid   3. RA- per rheumatology. Considering going on Renvoq  4. Multilobar PNA- atypical- resolved.   5. ILD now followed by pulmonary at [facility]. Stable by CT/PFTs in January.   6. Coronary calcification. Noted primarily in mid LAD. No  angina. She is on statin therapy. Labs recently done by Dr Su Hilt.   I will follow up in one year   Maleiyah Releford [place] MD, [facility]

## 2021-04-06 ENCOUNTER — Ambulatory Visit: Payer: Medicare Other | Admitting: Cardiology

## 2021-04-06 ENCOUNTER — Other Ambulatory Visit: Payer: Self-pay

## 2021-04-06 ENCOUNTER — Encounter: Payer: Self-pay | Admitting: Cardiology

## 2021-04-06 VITALS — BP 134/80 | HR 72 | Ht 63.5 in | Wt 157.2 lb

## 2021-04-06 DIAGNOSIS — I2584 Coronary atherosclerosis due to calcified coronary lesion: Secondary | ICD-10-CM | POA: Diagnosis not present

## 2021-04-06 DIAGNOSIS — I2510 Atherosclerotic heart disease of native coronary artery without angina pectoris: Secondary | ICD-10-CM

## 2021-04-06 DIAGNOSIS — I4819 Other persistent atrial fibrillation: Secondary | ICD-10-CM | POA: Diagnosis not present

## 2021-04-06 DIAGNOSIS — E878 Other disorders of electrolyte and fluid balance, not elsewhere classified: Secondary | ICD-10-CM | POA: Diagnosis not present

## 2021-04-13 ENCOUNTER — Ambulatory Visit: Payer: Medicare Other | Admitting: Cardiology

## 2021-05-12 ENCOUNTER — Other Ambulatory Visit: Payer: Self-pay | Admitting: Physical Medicine and Rehabilitation

## 2021-05-12 DIAGNOSIS — M542 Cervicalgia: Secondary | ICD-10-CM

## 2021-06-16 ENCOUNTER — Other Ambulatory Visit: Payer: Self-pay

## 2021-06-16 MED ORDER — APIXABAN 5 MG PO TABS: 5.0000 mg | ORAL_TABLET | Freq: Two times a day (BID) | ORAL | 1 refills | Status: DC

## 2021-06-16 NOTE — Telephone Encounter (Signed)
Prescription refill request for Eliquis received. Last office visit:Heather Koch 04/06/21 Scr:0.64 12/03/20 Age: 63F Weight:71.3KG

## 2021-06-18 ENCOUNTER — Telehealth: Payer: Self-pay | Admitting: Cardiology

## 2021-06-18 NOTE — Telephone Encounter (Signed)
*  STAT* If patient is at the pharmacy, call can be transferred to refill team.   1. Which medications need to be refilled? (please list name of each medication and dose if known) new prescription for Diltiazem  2. Which pharmacy/location (including street and city if local pharmacy) is medication to be sent to?  [facility] [place], [place]  3. Do they need a 30 day or 90 day supply? 90 days and refills

## 2021-06-18 NOTE — Telephone Encounter (Signed)
°*  STAT* If patient is at the pharmacy, call can be transferred to refill team. ° ° °1. Which medications need to be refilled? (please list name of each medication and dose if known) new prescription for Eliquis ° °2. Which pharmacy/location (including street and city if local pharmacy) is medication to be sent to? [facility] ° °3. Do they need a 30 day or 90 day supply? 90 days and refills ° °

## 2021-06-19 MED ORDER — DILTIAZEM HCL 30 MG PO TABS: 30.0000 mg | ORAL_TABLET | Freq: Four times a day (QID) | ORAL | 1 refills | Status: DC | PRN

## 2021-06-19 MED ORDER — DILTIAZEM HCL ER COATED BEADS 240 MG PO CP24
240.0000 mg | ORAL_CAPSULE | Freq: Every day | ORAL | 1 refills | Status: DC
Start: 2021-06-19 — End: 2021-10-23

## 2021-06-19 NOTE — Telephone Encounter (Signed)
Refills has been sent to the pharmacy. 

## 2021-06-23 ENCOUNTER — Encounter: Payer: Self-pay | Admitting: Cardiology

## 2021-06-23 ENCOUNTER — Encounter: Payer: Self-pay | Admitting: *Deleted

## 2021-06-23 ENCOUNTER — Other Ambulatory Visit: Payer: Self-pay

## 2021-06-23 ENCOUNTER — Ambulatory Visit: Payer: Medicare Other | Admitting: Cardiology

## 2021-06-23 ENCOUNTER — Ambulatory Visit (INDEPENDENT_AMBULATORY_CARE_PROVIDER_SITE_OTHER): Payer: Medicare Other

## 2021-06-23 VITALS — BP 122/76 | HR 71 | Ht 64.0 in | Wt 158.0 lb

## 2021-06-23 DIAGNOSIS — I4819 Other persistent atrial fibrillation: Secondary | ICD-10-CM

## 2021-06-23 NOTE — Progress Notes (Signed)
Electrophysiology Office Note   Date:  06/23/2021   ID:  Heather Koch, DOB 1951-05-21, MRN [id number]  PCP:  Burton Apley, MD  Cardiologist:  [place] Primary Electrophysiologist:  Regan Lemming, MD    Chief Complaint: AF   History of Present Illness: Heather Koch is a 70 y.o. female who is being seen today for the evaluation of AF at the request of Burton Apley, MD. Presenting today for electrophysiology evaluation.  She has a history significant for rheumatoid arthritis on methotrexate.  She was admitted to the hospital July 2021 with acute respiratory infection and hypoxia.  She was in atrial fibrillation during her hospital stay.  Cardiac monitor showed an 18% atrial fibrillation burden.  Chest CT showed evidence of ILD.  She presented to cardiology clinic with more frequent episodes of atrial fibrillation.  She is now status post ablation 12/19/2020.  Today, denies symptoms of  shortness of breath, orthopnea, PND, lower extremity edema, claudication, dizziness, presyncope, syncope, bleeding, or neurologic sequela. The patient is tolerating medications without difficulties.  Unfortunately she has started to have more frequent palpitations.  She has had episodes where her heart rate is in the 120s at times.  She feels somewhat fatigued when these occur.  She is also been having chest pain.  It is a sharp pain in the center of her chest that lasts for approximately 1 second.   Past Medical History:  Diagnosis Date   ASCUS (atypical squamous cells of undetermined significance) on Pap smear 12/2008   neg HR HPV   LGSIL (low grade squamous intraepithelial dysplasia) 2005/2006   Lichen sclerosus 2012   Pneumonia    Rheumatoid arthritis(714.0) 2012   Past Surgical History:  Procedure Laterality Date   ATRIAL FIBRILLATION ABLATION N/A 12/19/2020   Procedure: ATRIAL FIBRILLATION ABLATION;  Surgeon: Regan Lemming, MD;  Location: [facility];  Service: Cardiovascular;   Laterality: N/A;   TUBAL LIGATION  1997     Current Outpatient Medications  Medication Sig Dispense Refill   acetaminophen (TYLENOL) 500 MG tablet Take 1,000 mg by mouth 2 (two) times daily.     ALPRAZolam (XANAX) 1 MG tablet Take 0.5-1 mg by mouth at bedtime as needed for anxiety or sleep.     apixaban (ELIQUIS) 5 MG TABS tablet Take 1 tablet (5 mg total) by mouth 2 (two) times daily. 180 tablet 1   Cholecalciferol (VITAMIN D) 50 MCG (2000 UT) tablet Take 2,000 Units by mouth daily.     clobetasol cream (TEMOVATE) 0.05 % APPLY TOPICALLY AT BEDTIME AS NEEDED FOR ITCHING 30 g 1   diclofenac sodium (VOLTAREN) 1 % GEL Apply 3 gm to 3 large joints up to 3 times a day.Dispense 3 tubes with 3 refills. 3 Tube 1   diltiazem (CARDIZEM CD) 240 MG 24 hr capsule Take 1 capsule (240 mg total) by mouth daily. 90 capsule 1   diltiazem (CARDIZEM) 30 MG tablet Take 1 tablet (30 mg total) by mouth every 6 (six) hours as needed (for elevated heart rates). 90 tablet 1   Multiple Vitamin (MULTIVITAMIN PO) Take 1 tablet by mouth daily.      simvastatin (ZOCOR) 20 MG tablet Take 20 mg by mouth daily.     Upadacitinib (RINVOQ PO) Take by mouth.     predniSONE (DELTASONE) 10 MG tablet 30 mg/day x 2 days, then 20 mg/day x 2 days, then 10 mg/day x 2 days, then 5 mg/day x 2 days and stop. (Patient not  taking: Reported on 06/23/2021)     No current facility-administered medications for this visit.    Allergies:   Fish allergy   Social History:  The patient  reports that she has never smoked. She has never used smokeless tobacco. She reports that she does not drink alcohol and does not use drugs.   Family History:  The patient's family history includes Aneurysm in her sister; Cancer in her brother, father, maternal aunt, and maternal uncle; Diabetes in her mother; Healthy in her daughter, son, and son; Heart disease in her brother, sister, sister, and sister; Hypertension in her mother.   ROS:  Please see the history  of present illness.   Otherwise, review of systems is positive for none.   All other systems are reviewed and negative.   PHYSICAL EXAM: VS:  BP 122/76   Pulse 71   Ht  (1.626 m)   Wt 158 lb (71.7 kg)   LMP 03/31/2003   SpO2 98%   BMI 27.12 kg/m  , BMI Body mass index is 27.12 kg/m. GEN: Well nourished, well developed, in no acute distress  HEENT: normal  Neck: no JVD, carotid bruits, or masses Cardiac: RRR; no murmurs, rubs, or gallops,no edema  Respiratory:  clear to auscultation bilaterally, normal work of breathing GI: soft, nontender, nondistended, + BS MS: no deformity or atrophy  Skin: warm and dry Neuro:  Strength and sensation are intact Psych: euthymic mood, full affect  EKG:  EKG is ordered today. Personal review of the ekg ordered shows sinus rhythm, rate of 71  Recent Labs: 08/06/2020: ALT 13 12/03/2020: BUN 13; Creatinine, Ser 0.64; Hemoglobin 12.2; Platelets 300; Potassium 4.4; Sodium 139    Lipid Panel     Component Value Date/Time   CHOL 191 08/06/2020 0921   TRIG 120 08/06/2020 0921   HDL 79 08/06/2020 0921   CHOLHDL 2.4 08/06/2020 0921   LDLCALC 90 08/06/2020 0921     Wt Readings from Last 3 Encounters:  06/23/21 158 lb (71.7 kg)  04/06/21 157 lb 3.2 oz (71.3 kg)  03/23/21 157 lb (71.2 kg)      Other studies Reviewed: Additional studies/ records that were reviewed today include: TTE 04/21/20  Review of the above records today demonstrates:   1. Left ventricular ejection fraction, by estimation, is 50 to 55%. The  left ventricle has low normal function. The left ventricle demonstrates  global hypokinesis. Left ventricular diastolic parameters are  indeterminate.   2. Right ventricular systolic function is normal. The right ventricular  size is normal. Tricuspid regurgitation signal is inadequate for assessing  PA pressure.   3. The mitral valve is grossly normal. Trivial mitral valve  regurgitation.   4. The aortic valve is tricuspid.  Aortic valve regurgitation is not  visualized.   5. The inferior vena cava is normal in size with greater than 50%  respiratory variability, suggesting right atrial pressure of 3 mmHg.  Cardiac monitor 06/17/2020 personally reviewed Normal sinus rhythm Paroxysmal AFib with RVR- average HR in Afib 121 bpm. AFib burden of 13% Rare PVCs and PACs. Patient symptoms correlate with Afib.   ASSESSMENT AND PLAN:  1.  Persistent atrial fibrillation: Currently on diltiazem 240 mg daily, Eliquis 5 mg twice daily.  CHA2DS2-VASc of 2.  Status post ablation 12/19/2020.  Fortunately she is continue to have palpitations.  Is unclear to me whether or not this is due to atrial fibrillation or some other arrhythmia.  We [facility] Every plan for A. tach monitor.  2.  Coronary artery calcification: Noted in the mid LAD without angina.  Continue simvastatin 20 mg per primary cardiology.  She is having chest pain, but this is nonexertional and quite short-lived.  Unlikely to be cardiac in nature.  3.  Interstitial lung disease: Followed by pulmonary medicine at [facility]  4.  Rheumatoid arthritis: Continue methotrexate per rheumatology.  Current medicines are reviewed at length with the patient today.   The patient does not have concerns regarding her medicines.  The following changes were made today: None  Labs/ tests ordered today include:  Orders Placed This Encounter  Procedures   LONG TERM MONITOR (3-14 DAYS)   EKG 12-Lead      Disposition:   FU with Emmakate Hypes 6 months  Signed, Ahja Martello Jorja Loa, MD  06/23/2021 3:58 PM     [facility] [address] [place] [address] [phone] (office) [phone] (fax)

## 2021-06-23 NOTE — Progress Notes (Signed)
14 Day Monitor mailed to pt.

## 2021-06-23 NOTE — Patient Instructions (Addendum)
Medication Instructions:  Your physician recommends that you continue on your current medications as directed. Please refer to the Current Medication list given to you today.  *If you need a refill on your cardiac medications before your next appointment, please call your pharmacy*   Lab Work: None ordered  Testing/Procedures: Your physician has recommended that you wear a holter monitor. Holter monitors are medical devices that record the heart's electrical activity. Doctors most often use these monitors to diagnose arrhythmias. Arrhythmias are problems with the speed or rhythm of the heartbeat. The monitor is a small, portable device. You can wear one while you do your normal daily activities. Please see further instructions below under "other instructions"    Follow-Up: At [facility], you and your health needs are our priority.  As part of our continuing mission to provide you with exceptional heart care, we have created designated Provider Care Teams.  These Care Teams include your primary Cardiologist (physician) and Advanced Practice Providers (APPs -  Physician Assistants and Nurse Practitioners) who all work together to provide you with the care you need, when you need it.  Your next appointment:   6 month(s)  The format for your next appointment:   In Person  Provider:   Loman Brooklyn, MD    Thank you for choosing [facility]!!   Dory Horn, RN [phone]   Other Instructions                           ZIO XT- Long Term Monitor Instructions  Your physician has requested you wear a ZIO patch monitor for 14 days.  This is a single patch monitor. [employer] supplies one patch monitor per enrollment. Additional stickers are not available. Please do not apply patch if you will be having a Nuclear Stress Test,  Echocardiogram, Cardiac CT, MRI, or Chest Xray during the period you would be wearing the  monitor. The patch cannot be worn during these tests. You  cannot remove and re-apply the  ZIO XT patch monitor.  Your ZIO patch monitor will be mailed 3 day USPS to your address on file. It may take 3-5 days  to receive your monitor after you have been enrolled.  Once you have received your monitor, please review the enclosed instructions. Your monitor  has already been registered assigning a specific monitor serial # to you.  Billing and Patient Assistance Program Information  We have supplied [employer] with any of your insurance information on file for billing purposes. [employer] offers a sliding scale Patient Assistance Program for patients that do not have  insurance, or whose insurance does not completely cover the cost of the ZIO monitor.  You must apply for the Patient Assistance Program to qualify for this discounted rate.  To apply, please call [employer] at [phone], select option 4, select option 2, ask to apply for  Patient Assistance Program. Meredeth Ide will ask your household income, and how many people  are in your household. They will quote your out-of-pocket cost based on that information.  [employer] will also be able to set up a 70-month, interest-free payment plan if needed.  Applying the monitor   Shave hair from upper left chest.  Hold abrader disc by orange tab. Rub abrader in 40 strokes over the upper left chest as  indicated in your monitor instructions.  Clean area with 4 enclosed alcohol pads. Let dry.  Apply patch as indicated in monitor instructions. Patch will be  placed under collarbone on left  side of chest with arrow pointing upward.  Rub patch adhesive wings for 2 minutes. Remove white label marked "1". Remove the white  label marked "2". Rub patch adhesive wings for 2 additional minutes.  While looking in a mirror, press and release button in center of patch. A small green light will  flash 3-4 times. This will be your only indicator that the monitor has been turned on.  Do not shower for the first 24 hours. You may  shower after the first 24 hours.  Press the button if you feel a symptom. You will hear a small click. Record Date, Time and  Symptom in the Patient Logbook.  When you are ready to remove the patch, follow instructions on the last 2 pages of Patient  Logbook. Stick patch monitor onto the last page of Patient Logbook.  Place Patient Logbook in the blue and white box. Use locking tab on box and tape box closed  securely. The blue and white box has prepaid postage on it. Please place it in the mailbox as  soon as possible. Your physician should have your test results approximately 7 days after the  monitor has been mailed back to [facility].  Call [facility] Customer Care at [phone] if you have questions regarding  your ZIO XT patch monitor. Call them immediately if you see an orange light blinking on your  monitor.  If your monitor falls off in less than 4 days, contact our Monitor department at [phone].  If your monitor becomes loose or falls off after 4 days call [employer] at [phone] for  suggestions on securing your monitor

## 2021-06-24 ENCOUNTER — Telehealth: Payer: Self-pay | Admitting: Cardiology

## 2021-06-24 ENCOUNTER — Telehealth: Payer: Self-pay | Admitting: *Deleted

## 2021-06-24 MED ORDER — APIXABAN 5 MG PO TABS: 5.0000 mg | ORAL_TABLET | Freq: Two times a day (BID) | ORAL | 0 refills | Status: DC

## 2021-06-24 NOTE — Telephone Encounter (Signed)
  Patient calling the office for samples of medication:   1.  What medication and dosage are you requesting samples for? apixaban (ELIQUIS) 5 MG TABS tablet  2.  Are you currently out of this medication? No  Patient is not going to get the [facility] until 07/14/21. She does not want to run out of medication

## 2021-06-24 NOTE — Telephone Encounter (Signed)
Spoke with patient letting her know that samples for her Eliquis has been left up front for her to pick up.

## 2021-06-26 DIAGNOSIS — I4819 Other persistent atrial fibrillation: Secondary | ICD-10-CM

## 2021-08-27 ENCOUNTER — Encounter: Payer: Self-pay | Admitting: Nurse Practitioner

## 2021-10-23 ENCOUNTER — Other Ambulatory Visit: Payer: Self-pay

## 2021-10-23 ENCOUNTER — Telehealth: Payer: Self-pay | Admitting: Cardiology

## 2021-10-23 MED ORDER — DILTIAZEM HCL ER COATED BEADS 240 MG PO CP24: 240.0000 mg | ORAL_CAPSULE | Freq: Every day | ORAL | 1 refills | Status: DC

## 2021-10-23 MED ORDER — APIXABAN 5 MG PO TABS: 5.0000 mg | ORAL_TABLET | Freq: Two times a day (BID) | ORAL | 3 refills | Status: DC

## 2021-10-23 NOTE — Telephone Encounter (Signed)
°*  STAT* If patient is at the pharmacy, call can be transferred to refill team.   1. Which medications need to be refilled? (please list name of each medication and dose if known)  diltiazem (CARDIZEM CD) 240 MG 24 hr capsule 2x daily   2. Which pharmacy/location (including street and city if local pharmacy) is medication to be sent to? [id number] - [place], [place] - [address] AT [facility] [address]  3. Do they need a 30 day or 90 day supply? 90

## 2021-10-23 NOTE — Telephone Encounter (Signed)
Prescription refill request for Eliquis received. Indication:Afib Last office visit:9/22 Scr:0.6 Age: [age] Weight:71.7 kg  Prescription refilled

## 2021-10-23 NOTE — Telephone Encounter (Signed)
°*  STAT* If patient is at the pharmacy, call can be transferred to refill team.   1. Which medications need to be refilled? (please list name of each medication and dose if known)  apixaban (ELIQUIS) 5 MG TABS tablet  2. Which pharmacy/location (including street and city if local pharmacy) is medication to be sent to? [facility] - [place], [place] - [address] [place]  3. Do they need a 30 day or 90 day supply? 90 with refills

## 2021-11-12 ENCOUNTER — Telehealth: Payer: Self-pay

## 2021-11-12 DIAGNOSIS — R931 Abnormal findings on diagnostic imaging of heart and coronary circulation: Secondary | ICD-10-CM

## 2021-11-12 NOTE — Telephone Encounter (Signed)
° °  Pre-operative Risk Assessment    Patient Name: Heather Koch  DOB: 07/03/1951 MRN: [id number]      Request for Surgical Clearance    Procedure:   COLONOSCOPY/EGD  Date of Surgery:  Clearance 12/03/21                                 Surgeon:   Surgeon's Group or Practice Name:  [facility] Phone number:  [phone] Fax number:  [phone]   Type of Clearance Requested:   - Pharmacy:  Hold Apixaban (Eliquis)     Type of Anesthesia:   PROPOFOL   Additional requests/questions:

## 2021-11-13 DIAGNOSIS — R931 Abnormal findings on diagnostic imaging of heart and coronary circulation: Secondary | ICD-10-CM | POA: Insufficient documentation

## 2021-11-13 NOTE — Telephone Encounter (Signed)
Patient with diagnosis of afib on Eliquis for anticoagulation.    Procedure: colonoscopy/EGD Date of procedure: 12/03/21  CHA2DS2-VASc Score = 3  This indicates a 3.2% annual risk of stroke. The patient's score is based upon: CHF History: 0 HTN History: 0 Diabetes History: 0 Stroke History: 0 Vascular Disease History: 1 Age Score: 1 Gender Score: 1  CrCl 3181mL/min using adjusted body weight Platelet count 300K  Per office protocol, patient can hold Eliquis for 1-2 days prior to procedure.

## 2021-12-01 ENCOUNTER — Other Ambulatory Visit: Payer: Self-pay | Admitting: Rheumatology

## 2021-12-01 DIAGNOSIS — M0579 Rheumatoid arthritis with rheumatoid factor of multiple sites without organ or systems involvement: Secondary | ICD-10-CM

## 2021-12-01 DIAGNOSIS — J849 Interstitial pulmonary disease, unspecified: Secondary | ICD-10-CM

## 2021-12-02 IMAGING — CT CT HEART MORPH/PULM VEIN W/ CM & W/O CA SCORE
2 of 8 series · 10 of 20 positions shown, 12 images · IV contrast (Omni 300)
Comparison: 06/19/2020 chest CT
COMPARISON: 06/19/2020 chest CT

Addendum:
EXAM:
OVER-READ INTERPRETATION  CT CHEST

The following report is an over-read performed by radiologist Dr.
Onnen Da Solly Choppa [REDACTED] on 12/10/2020. This over-read
does not include interpretation of cardiac or coronary anatomy or
pathology. The coronary CTA interpretation by the cardiologist is
attached.
CLINICAL DATA: Atrial fibrillation
Cardiac CTA
MEDICATIONS:
No medications.
TECHNIQUE: The patient was scanned on a Siemens [REDACTED]ice scanner. Gantry
rotation speed was 250 msecs. Collimation was 0.6 mm. A 100 kV
prospective scan was triggered in the ascending thoracic aorta at
35-75% of the R-R interval. Average HR during the scan was 70 bpm.
The 3D data set was interpreted on a dedicated work station using
MPR, MIP and VRT modes. A total of 80cc of contrast was used.

[Series 10: 0-95% · axial · 0.39mm/px · z∈[+1235,+1319]mm · 5 of 2510 slices shown, 7 images]
[im 419/2510  vessel]
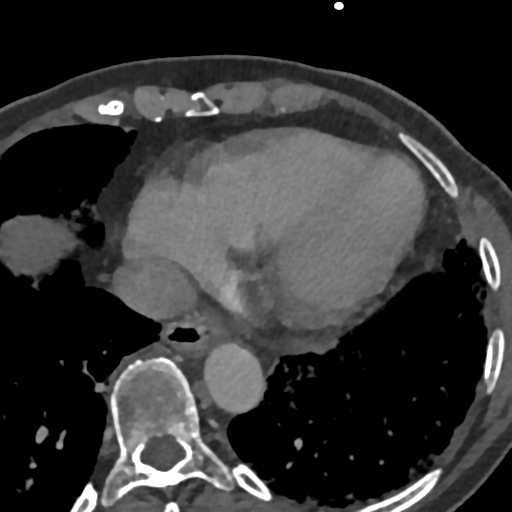
[im 419/2510  lung]
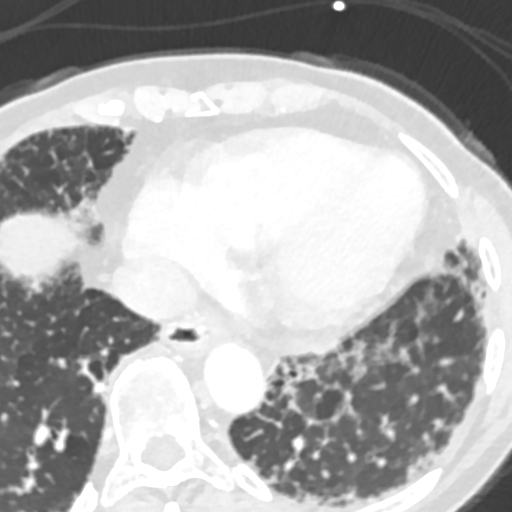
[im 837/2510  vessel]
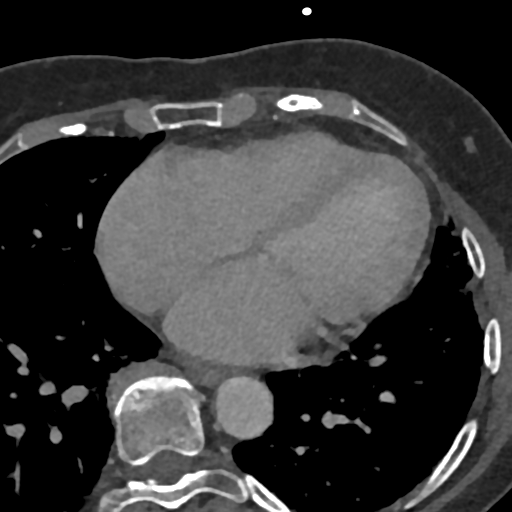
[im 1255/2510  vessel]
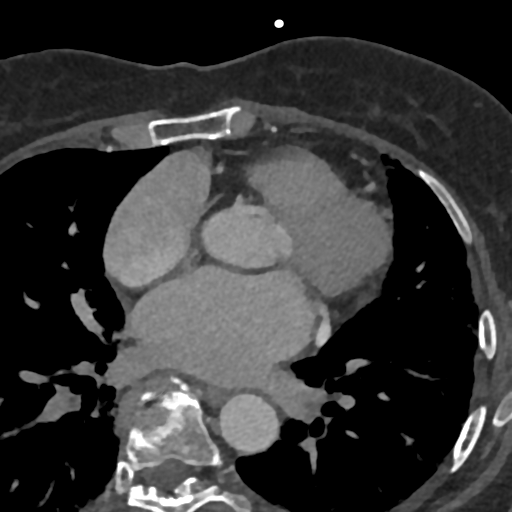
[im 1673/2510  vessel]
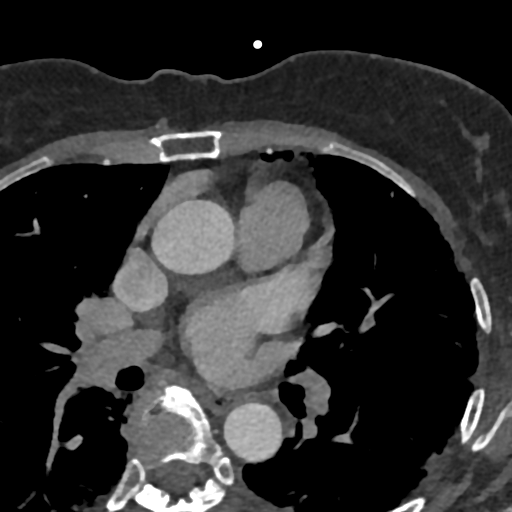
[im 2091/2510  vessel]
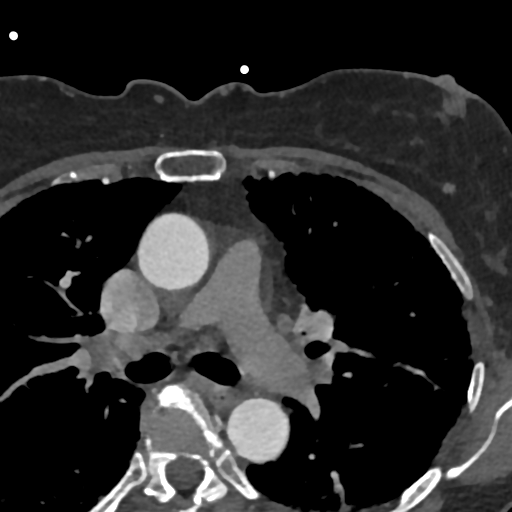
[im 2091/2510  lung]
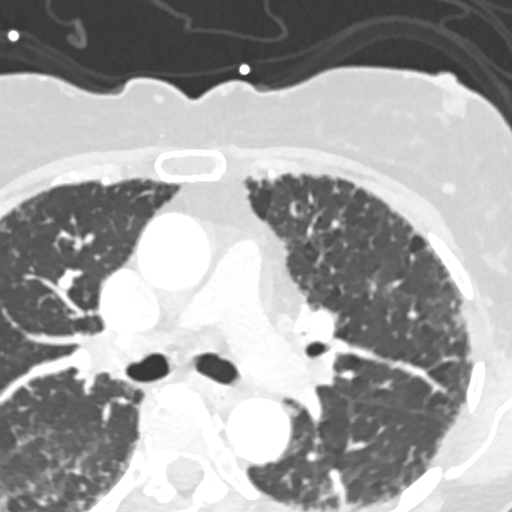

[Series 15: 5-95% · axial · 0.60mm/px · z∈[+1234,+1318]mm · 5 of 2510 slices shown]
[im 419/2510  vessel]
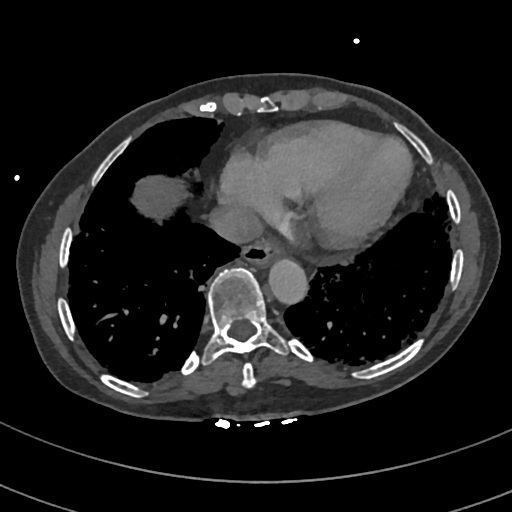
[im 837/2510  vessel]
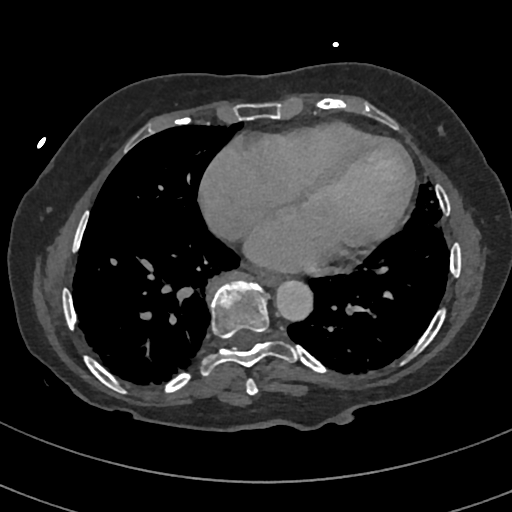
[im 1255/2510  vessel]
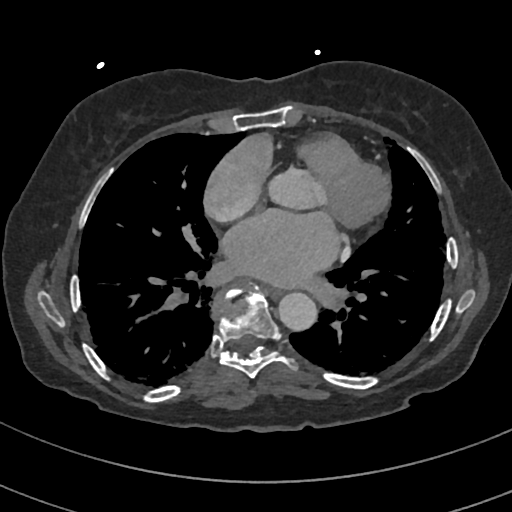
[im 1673/2510  vessel]
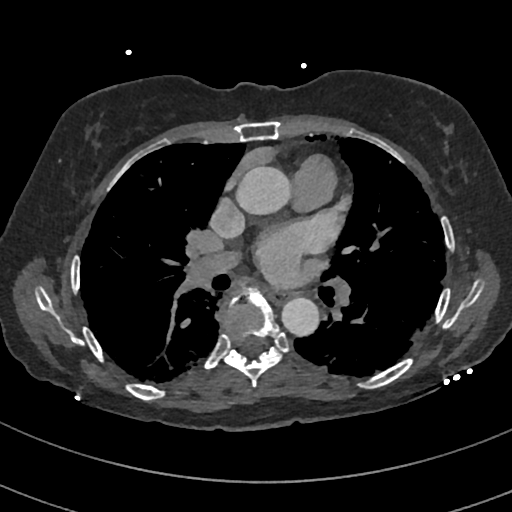
[im 2091/2510  vessel]
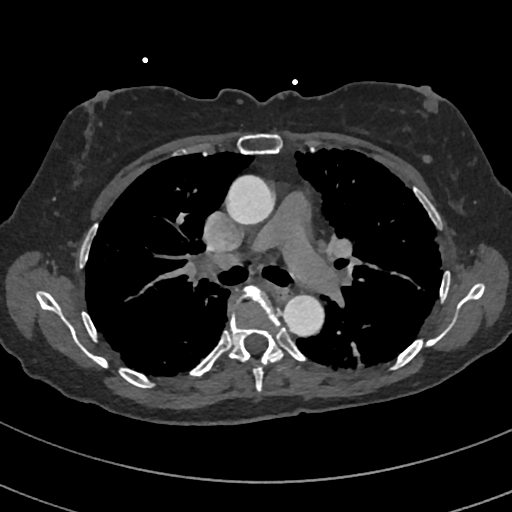

[10 of 20 positions shown; findings below may reference images not displayed]

FINDINGS: Vascular: Aortic atherosclerosis. No central pulmonary embolism, on
this non-dedicated study.

Mediastinum/Nodes: Upper normal right hilar node of 1.1 cm on [DATE],
likely reactive.

Tiny hiatal hernia.

Lungs/Pleura: No pleural fluid. A left lower lobe pulmonary nodule
measures 7 mm on 40/12 and is similar to 06/19/2020.

Lingular nodule measures 7 mm on [DATE] and is similar to on the
prior exam (when remeasured).

Subpleural reticulation is similar to on the prior exam, but
incompletely and suboptimally evaluated. No honeycombing.

Upper Abdomen: Normal imaged portions of the liver, spleen.

Musculoskeletal: Deep left breast nodule of 9 mm on [DATE], similar.
No acute osseous abnormality.
IMPRESSION: 1.  No acute findings in the imaged extracardiac chest.
2. Left-sided pulmonary nodules of up to 7 mm, similar for
approximately 5 months. Per consensus criteria, recommend follow-up
chest CT at 12-18 months. This recommendation follows the consensus
statement: Guidelines for Management of Incidental Pulmonary Nodules
Detected on CT Images:From the [HOSPITAL] 3869; published
online before print (10.1148/[id number]).
3. Interstitial lung disease, grossly similar but suboptimally
evaluated. Consider further evaluation with high-resolution chest
CT.
4.  Aortic Atherosclerosis (P1Y59-G3T.T).
FINDINGS: Non-cardiac: See separate report from [REDACTED].

Pulmonary veins drain normally to the left atrium. Images are poor
but there does not appear to be thrombus in the LA appendage.

Pulmonary veins:

LSPV 16 x 15 mm, 1.76 cm^2

LIPV 19 x 13 mm, 1.92 cm^2

RSPV 17 x 17 mm, 2.11 cm^2

RIPV 20 x 15 mm, 2.33 cm^2

Calcium Score: 97 Agatston units.

Coronary Arteries: Right dominant with no anomalies. Images are not
adequate to assess coronaries.
IMPRESSION: 1. Coronary artery calcium score 97 Agatston units. This places the
patient in the 76th percentile for age and gender, suggesting
intermediate risk for future cardiac events.

2.  Coronaries not visualized well enough to comment on.

3.  Pulmonary veins as noted above.

4. Difficult images, but I do not see a left atrial appendage
thrombus. If patient is thought to be high risk from thrombus, would
clear with PREM.

Marta Heniek Malmon

*** End of Addendum ***
EXAM:
OVER-READ INTERPRETATION  CT CHEST

The following report is an over-read performed by radiologist Dr.
Onnen Da Solly Choppa [REDACTED] on 12/10/2020. This over-read
does not include interpretation of cardiac or coronary anatomy or
pathology. The coronary CTA interpretation by the cardiologist is
attached.
FINDINGS: Vascular: Aortic atherosclerosis. No central pulmonary embolism, on
this non-dedicated study.

Mediastinum/Nodes: Upper normal right hilar node of 1.1 cm on [DATE],
likely reactive.

Tiny hiatal hernia.

Lungs/Pleura: No pleural fluid. A left lower lobe pulmonary nodule
measures 7 mm on 40/12 and is similar to 06/19/2020.

Lingular nodule measures 7 mm on [DATE] and is similar to on the
prior exam (when remeasured).

Subpleural reticulation is similar to on the prior exam, but
incompletely and suboptimally evaluated. No honeycombing.

Upper Abdomen: Normal imaged portions of the liver, spleen.

Musculoskeletal: Deep left breast nodule of 9 mm on [DATE], similar.
No acute osseous abnormality.
IMPRESSION: 1.  No acute findings in the imaged extracardiac chest.
2. Left-sided pulmonary nodules of up to 7 mm, similar for
approximately 5 months. Per consensus criteria, recommend follow-up
chest CT at 12-18 months. This recommendation follows the consensus
statement: Guidelines for Management of Incidental Pulmonary Nodules
Detected on CT Images:From the [HOSPITAL] 3869; published
online before print (10.1148/[id number]).
3. Interstitial lung disease, grossly similar but suboptimally
evaluated. Consider further evaluation with high-resolution chest
CT.
4.  Aortic Atherosclerosis (P1Y59-G3T.T).

## 2021-12-03 LAB — HM COLONOSCOPY

## 2021-12-21 ENCOUNTER — Ambulatory Visit: Payer: Medicare Other | Admitting: Cardiology

## 2022-01-15 ENCOUNTER — Ambulatory Visit
Admission: RE | Admit: 2022-01-15 | Discharge: 2022-01-15 | Disposition: A | Discharge status: Home / Self Care | Payer: Medicare Other | Source: Ambulatory Visit | Attending: Rheumatology | Admitting: Rheumatology

## 2022-01-15 DIAGNOSIS — M0579 Rheumatoid arthritis with rheumatoid factor of multiple sites without organ or systems involvement: Secondary | ICD-10-CM

## 2022-01-15 DIAGNOSIS — J849 Interstitial pulmonary disease, unspecified: Secondary | ICD-10-CM

## 2022-01-20 ENCOUNTER — Ambulatory Visit: Payer: Medicare Other | Admitting: Physician Assistant

## 2022-01-21 ENCOUNTER — Other Ambulatory Visit: Payer: Self-pay | Admitting: Diagnostic Radiology

## 2022-01-21 DIAGNOSIS — R911 Solitary pulmonary nodule: Secondary | ICD-10-CM

## 2022-01-21 DIAGNOSIS — Z8701 Personal history of pneumonia (recurrent): Secondary | ICD-10-CM

## 2022-01-21 DIAGNOSIS — J849 Interstitial pulmonary disease, unspecified: Secondary | ICD-10-CM

## 2022-01-31 NOTE — Progress Notes (Signed)
? ?Cardiology Office Note ?Date:  02/01/2022  ?Patient ID:  Heather Koch, DOB 10/25/1950, MRN [id number] ?PCP:  Lorene Dy, MD  ?Cardiologist:  Dr. Martinique ?Electrophysiologist: Dr. Curt Bears ? ?  ?Chief Complaint:  6 mo ? ?History of Present Illness: ?MOXIE FORGACS is a 71 y.o. female with history of RA, AFib, ILD ? ?She come sin today to be seen for Dr. Curt Bears, last seen by him Sept 2022, she of late had an up tick in palpitations, planned for a monitor to evaluate further.  Mentioned an episode of CP that was atypical sounding ? ?Monitor noted short lived SVT episodes, no changes to meds made. ? ?TODAY ?She is accompanied by her husband ?Rare palpitations.  This AM when first up and around felt her HR was fast though settled quickly, not particularly felt was AFib ?She odes not think she has had Afib that she has been aware of anyway. ?No CP, no SOB ?She has been changed from methotrexate to Rinvoq and has gained weight with it, but is doing great for her RA. ?She will have some ankle swelling at the end of the day if on her feet a lot ?No near syncope or syncope. ? ?She had a CT chest done to f/u on some nodules with some noted coronary atherosclerosis ? ? ?AFib AAD hx ?diagnosed July 2021 during a hospital stay with pneumonia ?PVI ablation 12/19/20 ? ?Past Medical History:  ?Diagnosis Date  ? ASCUS (atypical squamous cells of undetermined significance) on Pap smear 12/2008  ? neg HR HPV  ? LGSIL (low grade squamous intraepithelial dysplasia) 2005/2006  ? Lichen sclerosus 0000000  ? Pneumonia   ? Rheumatoid arthritis(714.0) 2012  ? ? ?Past Surgical History:  ?Procedure Laterality Date  ? ATRIAL FIBRILLATION ABLATION N/A 12/19/2020  ? Procedure: ATRIAL FIBRILLATION ABLATION;  Surgeon: Constance Haw, MD;  Location: [facility];  Service: Cardiovascular;  Laterality: N/A;  ? TUBAL LIGATION  1997  ? ? ?Current Outpatient Medications  ?Medication Sig Dispense Refill  ? acetaminophen (TYLENOL) 500 MG tablet Take  1,000 mg by mouth 2 (two) times daily.    ? ALPRAZolam (XANAX) 1 MG tablet Take 0.5-1 mg by mouth at bedtime as needed for anxiety or sleep.    ? apixaban (ELIQUIS) 5 MG TABS tablet Take 1 tablet (5 mg total) by mouth 2 (two) times daily. 90 tablet 3  ? Cholecalciferol (VITAMIN D) 50 MCG (2000 UT) tablet Take 2,000 Units by mouth daily.    ? clobetasol cream (TEMOVATE) 0.05 % APPLY TOPICALLY AT BEDTIME AS NEEDED FOR ITCHING 30 g 1  ? diclofenac sodium (VOLTAREN) 1 % GEL Apply 3 gm to 3 large joints up to 3 times a day.Dispense 3 tubes with 3 refills. 3 Tube 1  ? diltiazem (CARDIZEM CD) 240 MG 24 hr capsule Take 1 capsule (240 mg total) by mouth daily. 90 capsule 1  ? diltiazem (CARDIZEM) 30 MG tablet Take 1 tablet (30 mg total) by mouth every 6 (six) hours as needed (for elevated heart rates). 90 tablet 1  ? simvastatin (ZOCOR) 20 MG tablet Take 20 mg by mouth daily.    ? Upadacitinib (RINVOQ PO) Take 1 tablet by mouth daily.    ? ?No current facility-administered medications for this visit.  ? ? ?Allergies:   Fish allergy and Shellfish-derived products  ? ?Social History:  The patient  reports that she has never smoked. She has never used smokeless tobacco. She reports that she does not  drink alcohol and does not use drugs.  ? ?Family History:  The patient's family history includes Aneurysm in her sister; Cancer in her brother, father, maternal aunt, and maternal uncle; Diabetes in her mother; Healthy in her daughter, son, and son; Heart disease in her brother, sister, sister, and sister; Hypertension in her mother. ? ?ROS:  Please see the history of present illness.    ?All other systems are reviewed and otherwise negative.  ? ?PHYSICAL EXAM:  ?VS:  BP 120/60   Pulse 74   Ht 5\' 4"  (1.626 m)   Wt 159 lb 12.8 oz (72.5 kg)   LMP 03/31/2003   SpO2 96%   BMI 27.43 kg/m?  BMI: Body mass index is 27.43 kg/m?. ?Well nourished, well developed, in no acute distress ?HEENT: normocephalic, atraumatic ?Neck: no JVD,  carotid bruits or masses ?Cardiac:  RRR; no significant murmurs, no rubs, or gallops ?Lungs:  CTA b/l, no wheezing, rhonchi or rales ?Abd: soft, nontender ?MS: no deformity or atrophy ?Ext: no edema ?Skin: warm and dry, no rash ?Neuro:  No gross deficits appreciated ?Psych: euthymic mood, full affect ? ? ?EKG:  not done today ? ?Sep 2022, monitor ?Predominant rhythm was sinus rhythm ?<1% ventricular and supraventricular ectopy ?122 SVT episodes, longest 15 seconds ?2 VT episodes, longest 12 beats ?No symptoms recorded ? ?TTE 04/21/20  ? 1. Left ventricular ejection fraction, by estimation, is 50 to 55%. The  ?left ventricle has low normal function. The left ventricle demonstrates  ?global hypokinesis. Left ventricular diastolic parameters are  ?indeterminate.  ? 2. Right ventricular systolic function is normal. The right ventricular  ?size is normal. Tricuspid regurgitation signal is inadequate for assessing  ?PA pressure.  ? 3. The mitral valve is grossly normal. Trivial mitral valve  ?regurgitation.  ? 4. The aortic valve is tricuspid. Aortic valve regurgitation is not  ?visualized.  ? 5. The inferior vena cava is normal in size with greater than 50%  ?respiratory variability, suggesting right atrial pressure of 3 mmHg. ?  ?Cardiac monitor 06/17/2020  ?Normal sinus rhythm ?Paroxysmal AFib with RVR- average HR in Afib 121 bpm. AFib burden of 13% ?Rare PVCs and PACs. ?Patient symptoms correlate with Afib. ? ?Recent Labs: ?No results found for requested labs within last 8760 hours.  ?No results found for requested labs within last 8760 hours.  ? ?CrCl cannot be calculated (Patient's most recent lab result is older than the maximum 21 days allowed.).  ? ?Wt Readings from Last 3 Encounters:  ?02/01/22 159 lb 12.8 oz (72.5 kg)  ?06/23/21 158 lb (71.7 kg)  ?04/06/21 157 lb 3.2 oz (71.3 kg)  ?  ? ?Other studies reviewed: ?Additional studies/records reviewed today include: summarized above ? ?ASSESSMENT AND  PLAN: ? ?Persistent AFib ?CHA2DS2Vasc is 2, maintained on Eliquis, appropriately dosed ?Low/no burden by symptoms ? ? ?2. Incidental coronary atherosclerosis on a chest CT ? No symptoms ?Discussed risk modifications ?She follows her lipids/labs with her PMD ? ? ?Disposition: F/u with [place] in 68mo, sooner if needed ? ?Current medicines are reviewed at length with the patient today.  The patient did not have any concerns regarding medicines. ? ?Signed, ?Tommye Standard, PA-C ?02/01/2022 5:19 PM    ? ?[facility] ?[address] ?[address] ?[place] [address] ?[phone] (office)  ?[phone] (fax) ? ? ?

## 2022-02-01 ENCOUNTER — Ambulatory Visit: Payer: Medicare Other | Admitting: Physician Assistant

## 2022-02-01 ENCOUNTER — Encounter: Payer: Self-pay | Admitting: Physician Assistant

## 2022-02-01 VITALS — BP 120/60 | HR 74 | Ht 64.0 in | Wt 159.8 lb

## 2022-02-01 DIAGNOSIS — I480 Paroxysmal atrial fibrillation: Secondary | ICD-10-CM | POA: Diagnosis not present

## 2022-02-01 DIAGNOSIS — I2510 Atherosclerotic heart disease of native coronary artery without angina pectoris: Secondary | ICD-10-CM

## 2022-02-01 NOTE — Patient Instructions (Signed)
Medication Instructions:  ? ?Your physician recommends that you continue on your current medications as directed. Please refer to the Current Medication list given to you today. ? ?*If you need a refill on your cardiac medications before your next appointment, please call your pharmacy* ? ? ?Lab Work: NONE ORDERED  TODAY ? ? ?If you have labs (blood work) drawn today and your tests are completely normal, you will receive your results only by: ?MyChart Message (if you have MyChart) OR ?A paper copy in the mail ?If you have any lab test that is abnormal or we need to change your treatment, we will call you to review the results. ? ? ?Testing/Procedures: NONE ORDERED  TODAY ? ? ? ? ?Follow-Up: ?At [facility], you and your health needs are our priority.  As part of our continuing mission to provide you with exceptional heart care, we have created designated Provider Care Teams.  These Care Teams include your primary Cardiologist (physician) and Advanced Practice Providers (APPs -  Physician Assistants and Nurse Practitioners) who all work together to provide you with the care you need, when you need it. ? ?We recommend signing up for the patient portal called "MyChart".  Sign up information is provided on this After Visit Summary.  MyChart is used to connect with patients for Virtual Visits (Telemedicine).  Patients are able to view lab/test results, encounter notes, upcoming appointments, etc.  Non-urgent messages can be sent to your provider as well.   ?To learn more about what you can do with MyChart, go to [email or web address].   ? ?Your next appointment:   ?6 month(s) ? ?The format for your next appointment:   ?In Person ? ?Provider:   ?You may see Will Martin Camnitz, MD or one of the following Advanced Practice Providers on your designated Care Team:   ?Renee Ursuy, PA-C ?Michael "Andy" Tillery, PA-C{ ? ? ? ?Other Instructions ? ? ?Important Information About Sugar ? ? ? ? ?  ?

## 2022-02-15 LAB — HM DEXA SCAN

## 2022-03-01 ENCOUNTER — Ambulatory Visit (INDEPENDENT_AMBULATORY_CARE_PROVIDER_SITE_OTHER): Payer: Medicare Other | Admitting: Nurse Practitioner

## 2022-03-01 ENCOUNTER — Encounter: Payer: Self-pay | Admitting: Nurse Practitioner

## 2022-03-01 VITALS — BP 124/72 | Ht 63.0 in | Wt 158.0 lb

## 2022-03-01 DIAGNOSIS — M810 Age-related osteoporosis without current pathological fracture: Secondary | ICD-10-CM | POA: Diagnosis not present

## 2022-03-01 DIAGNOSIS — Z01419 Encounter for gynecological examination (general) (routine) without abnormal findings: Secondary | ICD-10-CM

## 2022-03-01 DIAGNOSIS — Z780 Asymptomatic menopausal state: Secondary | ICD-10-CM

## 2022-03-01 DIAGNOSIS — L900 Lichen sclerosus et atrophicus: Secondary | ICD-10-CM

## 2022-03-01 MED ORDER — CLOBETASOL PROPIONATE 0.05 % EX CREA: TOPICAL_CREAM | CUTANEOUS | 1 refills | Status: AC

## 2022-03-01 NOTE — Progress Notes (Signed)
? ?ALAYASIA BREEDING [birth year] [id number] ? ? ?History:  71 y.o. G3P3 presents for breast and pelvic exam without GYN complaints. Postmenopausal - no HRT, no bleeding. 2006 LGSIL, subsequent paps normal. RA and osteoporosis managed by rheumatology. Normal mammogram history. History of ILD, cardiac ablation 12/2020 for a fib - on Cardizem and Eliquis. Lichen sclerosus managed with clobetasol as needed with occasional exacerbations.  ? ?Gynecologic History ?Patient's last menstrual period was 03/31/2003. ?  ?Contraception/Family planning: post menopausal status  ?Sexually active: Yes ? ?Health Maintenance ?Last Pap: 12/21/2016. Results were: Normal ?Last mammogram: 08/27/2021. Results were: Normal ?Last colonoscopy: 01/2022. Results were: Polyps ?Last Dexa: 02/2022. Results were: Osteoporosis (no report in Epic/care everywhere) ? ?Past medical history, past surgical history, family history and social history were all reviewed and documented in the EPIC chart. Married. 2 sons, 1 daughter, 5 grandchildren.  ? ?ROS:  A ROS was performed and pertinent positives and negatives are included. ? ?Exam: ? ?Vitals:  ? 71 y.o. 1325  ?BP: 124/72  ?Weight: 158 lb (71.7 kg)  ?Height:  (1.6 m)  ? ? ?Body mass index is 27.99 kg/m?. ? ?General appearance:  Normal ?Thyroid:  Symmetrical, normal in size, without palpable masses or nodularity. ?Respiratory ? Auscultation:  Mild crackles and wheezing in bilateral lower lobes ?Cardiovascular ? Auscultation:  Tachycardia, without rubs, murmurs or gallops ? Edema/varicosities:  Not grossly evident ?Abdominal ? Soft,nontender, without masses, guarding or rebound. ? Liver/spleen:  No organomegaly noted ? Hernia:  None appreciated ? Skin ? Inspection:  Grossly normal ?Breasts: Examined lying and sitting.  ? Right: Without masses, retractions, nipple discharge or axillary adenopathy. ? ? Left: Without masses, retractions, nipple discharge or axillary adenopathy. ?Genitourinary  ? Inguinal/mons:   Normal without inguinal adenopathy ? External genitalia:  Normal appearing vulva with no masses, tenderness, or lesions ? BUS/Urethra/Skene's glands:  Normal ? Vagina:  Normal appearing with normal color and discharge, no lesions. Atrophic changes ? Cervix:  Normal appearing without discharge or lesions ? Uterus:  Normal in size, shape and contour.  Midline and mobile, nontender ? Adnexa/parametria:   ?  Rt: Normal in size, without masses or tenderness. ?  Lt: Normal in size, without masses or tenderness. ? Anus and perineum: Normal ? Digital rectal exam: Normal sphincter tone without palpated masses or tenderness ? ?Patient informed chaperone available to be present for breast and pelvic exam. Patient has requested no chaperone to be present. Patient has been advised what will be completed during breast and pelvic exam.  ? ?Assessment/Plan:  71 y.o. G3P3 for annual exam.  ? ?Well female exam with routine gynecological exam - Education provided on SBEs, importance of preventative screenings, current guidelines, high calcium diet, regular exercise, and multivitamin daily. Labs done elsewhere.  ? ?Age-related osteoporosis without current pathological fracture - managed by rheumatology. Recent DXA showed osteoporosis (no report in epic/care everyone), taking Vitamin D and calcium daily. Has follow up appt to discuss.  ? ?Postmenopausal - no HRT, no bleeding ? ?Lichen sclerosus - Plan: clobetasol cream (TEMOVATE) 0.05 % as needed. Uses occasionally. ? ?Screening for cervical cancer - Normal Pap history.  No longer screening per guidelines.  ? ?Screening for breast cancer - Normal mammogram history.  Continue annual screenings.  Normal breast exam today. ? ?Screening for colon cancer - 2023 colonoscopy. She plans to schedule this soon as she is just now due for this.  ? ?Return in 2 years for breast and pelvic. ? ? ?Kohlton Gilpatrick Sylvie Farrier DNP, 2:22  PM 03/01/2022 ? ?

## 2022-05-07 ENCOUNTER — Other Ambulatory Visit: Payer: Self-pay | Admitting: Cardiology

## 2022-05-10 NOTE — Telephone Encounter (Signed)
Prescription refill request for Eliquis received. Indication:Afib Last office visit:4/23 Scr:0.6 Age: 71 Weight:71.7 kg  Prescription refilled

## 2022-06-10 NOTE — Progress Notes (Signed)
Primary Care Physician: Burton Apley, MD Referring Physician:Luke Diona Fanti, [place] Cardiologist: Dr. [place]    Heather Koch is a 71 y.o. female with a  h/o rheumatoid arthritis on methotrexate. She was admitted  in July 2021 for acute respiratory infection with hypoxia. She had an episode of afib  during this hospitalization,short in duration and self converted.She was started on eliquis with a CHA2DS2VASc of 2, and cardizem for rate control.. She has a history of palpitations for years.  Event monitor did show afib burden of 18%, no sustained afib. CT of chest showed  of ILD. She was seen by Dr Delton Coombes  who noted significant improvement in CT findings.  She was seen in the Afib clinic in follow up and options for AAD therapy were discussed. Not a candidate for amiodarone due to ILD. Could consider Tikosyn or Multaq. Patient was Ok with current AFib burden and was managed with rate control and anticoagulation. Has coronary calcification noted on CT.   When I last saw her she was having more Afib. Last week it was fairly continuous off and on for several days. Last night she had an episode lasting 3 hours. HR typically in 140-150 range on pulse ox. Takes metoprolol but doesn't really seem to help. Feels really bad when in Afib. No dizziness or syncope. Sometimes HR noted to be as low as 43.   She was seen by Dr Elberta Fortis and underwent Afib ablation on 12/19/20.   She is now being seen at [facility] by pulmonary and Rheumatology. Has ILD. Planned follow up PFTs and CT done in January. There was no change in CT compared to September. PFTs showed mild restriction with no obstruction and normal diffusion capacity. She has recent flair of RA in right shoulder and wrist and is on steroids now. Is now on Rinvoq.   Since ablation she has done very well. Feels like her old self. Did wear a monitor in September showing frequent short runs of SVT - longest 20 seconds.  She does note some mild swelling in her ankles  at times. Some mild bruising.   Past Medical History:  Diagnosis Date   ASCUS (atypical squamous cells of undetermined significance) on Pap smear 12/2008   neg HR HPV   LGSIL (low grade squamous intraepithelial dysplasia) 2005/2006   Lichen sclerosus 2012   Pneumonia    Rheumatoid arthritis(714.0) 2012   Past Surgical History:  Procedure Laterality Date   ATRIAL FIBRILLATION ABLATION N/A 12/19/2020   Procedure: ATRIAL FIBRILLATION ABLATION;  Surgeon: Regan Lemming, MD;  Location: [facility];  Service: Cardiovascular;  Laterality: N/A;   TUBAL LIGATION  1997    Current Outpatient Medications  Medication Sig Dispense Refill   acetaminophen (TYLENOL) 500 MG tablet Take 1,000 mg by mouth 2 (two) times daily.     ALPRAZolam (XANAX) 1 MG tablet Take 0.5-1 mg by mouth at bedtime as needed for anxiety or sleep.     Cholecalciferol (VITAMIN D) 50 MCG (2000 UT) tablet Take 2,000 Units by mouth daily.     clobetasol cream (TEMOVATE) 0.05 % APPLY TOPICALLY AT BEDTIME AS NEEDED FOR ITCHING 30 g 1   diclofenac sodium (VOLTAREN) 1 % GEL Apply 3 gm to 3 large joints up to 3 times a day.Dispense 3 tubes with 3 refills. 3 Tube 1   diltiazem (CARDIZEM CD) 240 MG 24 hr capsule Take 1 capsule (240 mg total) by mouth daily. 90 capsule 1   diltiazem (CARDIZEM)  30 MG tablet Take 1 tablet (30 mg total) by mouth every 6 (six) hours as needed (for elevated heart rates). 90 tablet 1   ELIQUIS 5 MG TABS tablet TAKE 1 TABLET BY MOUTH TWICE  DAILY 180 tablet 3   omeprazole (PRILOSEC) 40 MG capsule Take 40 mg by mouth daily.     simvastatin (ZOCOR) 20 MG tablet Take 20 mg by mouth daily.     Upadacitinib (RINVOQ PO) Take 1 tablet by mouth daily.     No current facility-administered medications for this visit.    Allergies  Allergen Reactions   Fish Allergy Other (See Comments)    Scallops  Reaction: Syncope   Shellfish-Derived Products     Other reaction(s): Unknown    Social History    Socioeconomic History   Marital status: Married    Spouse name: Not on file   Number of children: Not on file   Years of education: Not on file   Highest education level: Not on file  Occupational History   Not on file  Tobacco Use   Smoking status: Never   Smokeless tobacco: Never  Vaping Use   Vaping Use: Never used  Substance and Sexual Activity   Alcohol use: No   Drug use: Never   Sexual activity: Yes    Partners: Male    Birth control/protection: Post-menopausal    Comment: 1st intercourse 66 yo-1 partner  Other Topics Concern   Not on file  Social History Narrative   Not on file   Social Determinants of Health   Financial Resource Strain: Not on file  Food Insecurity: Not on file  Transportation Needs: Not on file  Physical Activity: Not on file  Stress: Not on file  Social Connections: Not on file  Intimate Partner Violence: Not on file    Family History  Problem Relation Age of Onset   Hypertension Mother    Diabetes Mother    Cancer Father        liver   Heart disease Sister    Aneurysm Sister    Cancer Brother        pancreatic cancer   Cancer Maternal Aunt        throat   Heart disease Sister    Heart disease Sister    Cancer Maternal Uncle        Colon   Heart disease Brother        pacemaker   Healthy Son    Healthy Son    Healthy Daughter     ROS- All systems are reviewed and negative except as per the HPI above  Physical Exam: There were no vitals filed for this visit. Wt Readings from Last 3 Encounters:  03/01/22 158 lb (71.7 kg)  02/01/22 159 lb 12.8 oz (72.5 kg)  06/23/21 158 lb (71.7 kg)    Labs: Lab Results  Component Value Date   NA 139 12/03/2020   K 4.4 12/03/2020   CL 101 12/03/2020   CO2 23 12/03/2020   GLUCOSE 97 12/03/2020   BUN 13 12/03/2020   CREATININE 0.64 12/03/2020   CALCIUM 9.8 12/03/2020   PHOS 4.0 04/23/2020   MG 2.3 04/23/2020   Lab Results  Component Value Date   INR 1.1 04/20/2020   Lab  Results  Component Value Date   CHOL 191 08/06/2020   HDL 79 08/06/2020   LDLCALC 90 08/06/2020   TRIG 120 08/06/2020   LMP 03/31/2003   Ecg today shows NSR rate  69. Low voltage. RSR' in V1.  I have personally reviewed and interpreted this study.  GEN- The patient is well appearing, alert and oriented x 3 today.   Head- normocephalic, atraumatic Eyes-  Sclera clear, conjunctiva pink Ears- hearing intact Oropharynx- clear Neck- supple, no JVP Lymph- no cervical lymphadenopathy Lungs- mild crackles bilaterally,  normal work of breathing Heart- Regular rate and rhythm, no murmurs, rubs or gallops, PMI not laterally displaced GI- soft, NT, ND, + BS Extremities- no clubbing, cyanosis, or edema MS- no significant deformity or atrophy Skin- no rash or lesion Psych- euthymic mood, full affect Neuro- strength and sensation are intact   Echo7/5/21  1. Left ventricular ejection fraction, by estimation, is 50 to 55%. The left ventricle has low normal function. The left ventricle demonstrates global hypokinesis. Left ventricular diastolic parameters are indeterminate. 2. Right ventricular systolic function is normal. The right ventricular size is normal. Tricuspid regurgitation signal is inadequate for assessing PA pressure. 3. The mitral valve is grossly normal. Trivial mitral valve regurgitation. 4. The aortic valve is tricuspid. Aortic valve regurgitation is not visualized. 5. The inferior vena cava is normal in size with greater than 50% respiratory variability, suggesting right atrial pressure of 3 mmHg.  Event monitor 06/17/20: Study Highlights  Normal sinus rhythm Paroxysmal AFib with RVR- average HR in Afib 121 bpm. AFib burden of 13% Rare PVCs and PACs. Patient symptoms correlate with Afib   Sep 2022, monitor Predominant rhythm was sinus rhythm <1% ventricular and supraventricular ectopy 122 SVT episodes, longest 15 seconds 2 VT episodes, longest 12 beats No symptoms  recorded    Assessment and Plan: 1. Paroxysmal afib-  Now s/p ablation on 12/19/20. Doing well. No documented recurrence.  On Eliquis for anticoagulation Given brief runs of SVT noted on monitor I would continue diltiazem  2. CHA2DS2VASc score of 2 Continue eliquis 5 mg bid   3. RA- per rheumatology. on Renvoq  4. Multilobar PNA- atypical- resolved.   5. ILD now followed by pulmonary at [facility]. Stable by CT/PFTs in January.   6. Coronary calcification. Noted primarily in mid LAD. No angina. She is on statin therapy. Labs recently followed by Dr Su Hilt  I will follow up in one year  Follow up with Dr Elberta Fortis in March  Roda Lauture [place] MD, [facility]

## 2022-06-17 ENCOUNTER — Ambulatory Visit: Payer: Medicare Other | Attending: Cardiology | Admitting: Cardiology

## 2022-06-17 ENCOUNTER — Encounter: Payer: Self-pay | Admitting: Cardiology

## 2022-06-17 VITALS — BP 124/60 | HR 69 | Ht 63.5 in | Wt 162.8 lb

## 2022-06-17 DIAGNOSIS — I480 Paroxysmal atrial fibrillation: Secondary | ICD-10-CM

## 2022-06-17 MED ORDER — DILTIAZEM HCL ER COATED BEADS 240 MG PO CP24: 240.0000 mg | ORAL_CAPSULE | Freq: Every day | ORAL | 3 refills | Status: DC

## 2022-06-17 NOTE — Patient Instructions (Signed)
Medication Instructions:  Your physician recommends that you continue on your current medications as directed. Please refer to the Current Medication list given to you today.  *If you need a refill on your cardiac medications before your next appointment, please call your pharmacy*   Lab Work: None   Testing/Procedures: None   Follow-Up: At [facility], you and your health needs are our priority.  As part of our continuing mission to provide you with exceptional heart care, we have created designated Provider Care Teams.  These Care Teams include your primary Cardiologist (physician) and Advanced Practice Providers (APPs -  Physician Assistants and Nurse Practitioners) who all work together to provide you with the care you need, when you need it.  We recommend signing up for the patient portal called "MyChart".  Sign up information is provided on this After Visit Summary.  MyChart is used to connect with patients for Virtual Visits (Telemedicine).  Patients are able to view lab/test results, encounter notes, upcoming appointments, etc.  Non-urgent messages can be sent to your provider as well.   To learn more about what you can do with MyChart, go to [email or web address].    Your next appointment:   1 year(s)  The format for your next appointment:   In Person  Provider:   Peter [place], MD     Other Instructions Please make an appointment with Dr. Elberta Fortis in March.   Important Information About Sugar

## 2022-09-16 ENCOUNTER — Encounter: Payer: Self-pay | Admitting: Nurse Practitioner

## 2022-09-23 NOTE — Progress Notes (Signed)
PCP:  Burton Apley, MD Primary Cardiologist: Peter Swaziland, MD Electrophysiologist: Regan Lemming, MD   Heather Koch is a 71 y.o. female seen today for Heather Jorja Loa, MD for routine electrophysiology followup. Since last being seen in our clinic the patient reports doing very well overall. She has rare, brief palpitations, but nothing sustained. Has occasional caught a HR up into the 90s with her watch that quickly "settles down".  she denies chest pain, dyspnea, PND, orthopnea, nausea, vomiting, dizziness, syncope, edema, weight gain, or early satiety.   Past Medical History:  Diagnosis Date   ASCUS (atypical squamous cells of undetermined significance) on Pap smear 12/2008   neg HR HPV   LGSIL (low grade squamous intraepithelial dysplasia) 2005/2006   Lichen sclerosus 2012   Pneumonia    Rheumatoid arthritis(714.0) 2012   Past Surgical History:  Procedure Laterality Date   ATRIAL FIBRILLATION ABLATION N/A 12/19/2020   Procedure: ATRIAL FIBRILLATION ABLATION;  Surgeon: Regan Lemming, MD;  [facility]: [facility];  Service: Cardiovascular;  Laterality: N/A;   TUBAL LIGATION  1997    Current Outpatient Medications  Medication Sig Dispense Refill   acetaminophen (TYLENOL) 500 MG tablet Take 1,000 mg by mouth 2 (two) times daily.     ALPRAZolam (XANAX) 1 MG tablet Take 0.5-1 mg by mouth at bedtime as needed for anxiety or sleep.     Cholecalciferol (VITAMIN D) 50 MCG (2000 UT) tablet Take 2,000 Units by mouth daily.     clobetasol cream (TEMOVATE) 0.05 % APPLY TOPICALLY AT BEDTIME AS NEEDED FOR ITCHING 30 g 1   diclofenac sodium (VOLTAREN) 1 % GEL Apply 3 gm to 3 large joints up to 3 times a day.Dispense 3 tubes with 3 refills. 3 Tube 1   diltiazem (CARDIZEM CD) 240 MG 24 hr capsule Take 1 capsule (240 mg total) by mouth daily. 90 capsule 3   diltiazem (CARDIZEM) 30 MG tablet Take 1 tablet (30 mg total) by mouth every 6 (six) hours as needed (for elevated heart  rates). 90 tablet 1   ELIQUIS 5 MG TABS tablet TAKE 1 TABLET BY MOUTH TWICE  DAILY 180 tablet 3   iron polysaccharides (NIFEREX) 150 MG capsule Take 150 mg by mouth daily.     omeprazole (PRILOSEC) 40 MG capsule Take 40 mg by mouth daily.     simvastatin (ZOCOR) 20 MG tablet Take 20 mg by mouth daily.     Upadacitinib (RINVOQ PO) Take 1 tablet by mouth daily.     No current facility-administered medications for this visit.    Allergies  Allergen Reactions   Fish Allergy Other (See Comments)    Scallops  Reaction: Syncope   Shellfish-Derived Products     Other reaction(s): Unknown    Social History   Socioeconomic History   Marital status: Married    Spouse name: Not on file   Number of children: Not on file   Years of education: Not on file   Highest education level: Not on file  Occupational History   Not on file  Tobacco Use   Smoking status: Never   Smokeless tobacco: Never  Vaping Use   Vaping Use: Never used  Substance and Sexual Activity   Alcohol use: No   Drug use: Never   Sexual activity: Yes    Partners: Male    Birth control/protection: Post-menopausal    Comment: 1st intercourse 53 yo-1 partner  Other Topics Concern   Not on file  Social  History Narrative   Not on file   Social Determinants of Health   Financial Resource Strain: Not on file  Food Insecurity: Not on file  Transportation Needs: Not on file  Physical Activity: Not on file  Stress: Not on file  Social Connections: Not on file  Intimate Partner Violence: Not on file     Review of Systems: All other systems reviewed and are otherwise negative except as noted above.  Physical Exam: Vitals:   09/24/22 1207  BP: 122/62  Pulse: 63  SpO2: 98%  Weight: 163 lb (73.9 kg)  Height: 5' 3.5" (1.613 m)    GEN- The patient is well appearing, alert and oriented x 3 today.   HEENT: normocephalic, atraumatic; sclera clear, conjunctiva pink; hearing intact; oropharynx clear; neck supple, no  JVP Lymph- no cervical lymphadenopathy Lungs- Clear to ausculation bilaterally, normal work of breathing.  No wheezes, rales, rhonchi Heart- Regular rate and rhythm, no murmurs, rubs or gallops, PMI not laterally displaced GI- soft, non-tender, non-distended, bowel sounds present, no hepatosplenomegaly Extremities- No peripheral edema. no clubbing or cyanosis; DP/PT/radial pulses 2+ bilaterally MS- no significant deformity or atrophy Skin- warm and dry, no rash or lesion Psych- euthymic mood, full affect Neuro- strength and sensation are intact  EKG is ordered. Personal review of EKG from today shows NSR at 63 bpm  Additional studies reviewed include: Previous EP notes.    Assessment and Plan:  1. Persistent AF S/p ablation 12/2020 and most quiescent since then EKG today shows NSR at 63 bpm Continue eliquis 5 mg BID for CHA2DS2VASc of at least 2  2. CAD on chest CT Risk factor modifications  Follow up with Dr. Elberta Fortis in 6 months  Graciella Freer, PA-C  09/24/22 12:19 PM

## 2022-09-24 ENCOUNTER — Ambulatory Visit: Payer: Medicare Other | Attending: Student | Admitting: Student

## 2022-09-24 ENCOUNTER — Encounter: Payer: Self-pay | Admitting: Student

## 2022-09-24 VITALS — BP 122/62 | HR 63 | Ht 63.5 in | Wt 163.0 lb

## 2022-09-24 DIAGNOSIS — I4819 Other persistent atrial fibrillation: Secondary | ICD-10-CM

## 2022-09-24 DIAGNOSIS — I2510 Atherosclerotic heart disease of native coronary artery without angina pectoris: Secondary | ICD-10-CM | POA: Diagnosis not present

## 2022-09-24 DIAGNOSIS — I2584 Coronary atherosclerosis due to calcified coronary lesion: Secondary | ICD-10-CM

## 2022-09-24 NOTE — Patient Instructions (Addendum)
Medication Instructions:  Your physician recommends that you continue on your current medications as directed. Please refer to the Current Medication list given to you today.  *If you need a refill on your cardiac medications before your next appointment, please call your pharmacy*  Lab Work: None ordered.  If you have labs (blood work) drawn today and your tests are completely normal, you will receive your results only by: MyChart Message (if you have MyChart) OR A paper copy in the mail If you have any lab test that is abnormal or we need to change your treatment, we will call you to review the results.  Testing/Procedures: None ordered.  Follow-Up: At [facility], you and your health needs are our priority.  As part of our continuing mission to provide you with exceptional heart care, we have created designated Provider Care Teams.  These Care Teams include your primary Cardiologist (physician) and Advanced Practice Providers (APPs -  Physician Assistants and Nurse Practitioners) who all work together to provide you with the care you need, when you need it.  We recommend signing up for the patient portal called "MyChart".  Sign up information is provided on this After Visit Summary.  MyChart is used to connect with patients for Virtual Visits (Telemedicine).  Patients are able to view lab/test results, encounter notes, upcoming appointments, etc.  Non-urgent messages can be sent to your provider as well.   To learn more about what you can do with MyChart, go to [email or web address].    Your next appointment:   Please schedule 6 month follow up appointment with Dr. Elberta Fortis  The format for your next appointment:   In Person  Provider:   Baldwin Crown" Lanna Poche, PA-C    Important Information About Sugar

## 2022-11-15 ENCOUNTER — Telehealth: Payer: Self-pay | Admitting: Cardiology

## 2022-11-15 NOTE — Telephone Encounter (Signed)
Pt c/o medication issue:  1. Name of Medication: Paxlovid 300 mg  2. How are you currently taking this medication (dosage and times per day)? 3 tablets in the morning and 3 tablets at night  3. Are you having a reaction (difficulty breathing--STAT)?   4. What is your medication issue? Patient has Covid. Patient is also taking taking Eliquis 5 mg- patient was told to find out if she needs to stop taking her Eliquis or should she not take such a strong dose of Paxlovid, since she is on Eliquis?

## 2022-11-15 NOTE — Telephone Encounter (Signed)
Paxlovid interacts with most medications pt takes, ideally would see if her PCP can prescribe molnupiravir instead of Paxlovid to avoid drug interactions completely. Otherwise, it interacts with her simvastatin, Eliquis, Rinvoq, diltiazem, and Xanax and she would need to adjust multiple medications as noted below:  Paxlovid is contraindicated to use with simvastatin - pt would need to stop her simvastatin while on Paxlovid and not resume until 5 days after Paxlovid course is completed. Paxlovid interacts with her Eliquis. She would need to decrease the dose of her Eliquis and only take 2.5mg  twice daily for the duration of Paxlovid treatment. Not recommended to use Rinvoq while on Paxlovid. Assume she has a rheumatologist who manages this med, not sure if she'd be able to hold it or not and she'd need to check with prescribing MD. She would need to cut her Xanax dose in half while on Paxlovid due to drug interaction. Paxlovid increases concentrations of her diltiazem. She would need to monitor her BP and HR and look out for drop in either.

## 2022-11-15 NOTE — Telephone Encounter (Signed)
Spoke to patient-patient reports starting Paxlovid on Saturday.  Advised of the adjustments below.   She is not taking simvastatin (or any statin) currently.   She is not taking Xanax or Rinvoq.   She is aware of adjustments to Eliquis and she is monitoring her BP and HR at home.    Advised to call back with any questions or concerns.

## 2022-11-15 NOTE — Telephone Encounter (Signed)
Forwarded to pharmD to review.  

## 2022-11-25 ENCOUNTER — Encounter (HOSPITAL_COMMUNITY): Payer: Self-pay | Admitting: *Deleted

## 2023-02-21 ENCOUNTER — Telehealth: Payer: Self-pay | Admitting: Cardiology

## 2023-02-21 MED ORDER — DILTIAZEM HCL ER COATED BEADS 240 MG PO CP24: 240.0000 mg | ORAL_CAPSULE | Freq: Every day | ORAL | 0 refills | Status: DC

## 2023-02-21 NOTE — Telephone Encounter (Signed)
*  STAT* If patient is at the pharmacy, call can be transferred to refill team.   1. Which medications need to be refilled? (please list name of each medication and dose if known) diltiazem (CARDIZEM CD) 240 MG 24 hr capsule   2. Which pharmacy/location (including street and city if local pharmacy) is medication to be sent to? [facility] - [place], [place] - [address] [place]   3. Do they need a 30 day or 90 day supply? 90  Pt would like a confirmation call once it's been sent.

## 2023-02-21 NOTE — Telephone Encounter (Signed)
Pt's medication was sent to pt's pharmacy as requested. Confirmation received.  °

## 2023-03-18 ENCOUNTER — Other Ambulatory Visit: Payer: Self-pay

## 2023-03-18 MED ORDER — DILTIAZEM HCL ER COATED BEADS 240 MG PO CP24: 240.0000 mg | ORAL_CAPSULE | Freq: Every day | ORAL | 1 refills | Status: DC

## 2023-03-24 ENCOUNTER — Other Ambulatory Visit (HOSPITAL_COMMUNITY): Payer: Self-pay | Admitting: Rheumatology

## 2023-03-24 DIAGNOSIS — J849 Interstitial pulmonary disease, unspecified: Secondary | ICD-10-CM

## 2023-03-30 ENCOUNTER — Encounter: Payer: Self-pay | Admitting: Cardiology

## 2023-03-30 ENCOUNTER — Ambulatory Visit: Payer: Medicare Other | Attending: Cardiology | Admitting: Cardiology

## 2023-03-30 VITALS — HR 71 | Ht 63.5 in | Wt 156.0 lb

## 2023-03-30 DIAGNOSIS — I2510 Atherosclerotic heart disease of native coronary artery without angina pectoris: Secondary | ICD-10-CM | POA: Diagnosis not present

## 2023-03-30 DIAGNOSIS — E785 Hyperlipidemia, unspecified: Secondary | ICD-10-CM | POA: Diagnosis not present

## 2023-03-30 DIAGNOSIS — I4819 Other persistent atrial fibrillation: Secondary | ICD-10-CM | POA: Diagnosis not present

## 2023-03-30 DIAGNOSIS — D6869 Other thrombophilia: Secondary | ICD-10-CM

## 2023-03-30 NOTE — Progress Notes (Signed)
  Electrophysiology Office Note:   Date:  03/30/2023  ID:  [name], DOB 08-11-1951, MRN [id number]  Primary Cardiologist: Peter Swaziland, MD Electrophysiologist: Regan Lemming, MD      History of Present Illness:   Heather Koch is a 72 y.o. female with h/o atrial fibrillation seen today for routine electrophysiology followup.  Since last being seen in our clinic the patient reports doing well.  she denies chest pain, palpitations, dyspnea, PND, orthopnea, nausea, vomiting, dizziness, syncope, edema, weight gain, or early satiety.  She recently had a trip to [place].  After her 12-hour drive back, she had a few days where her blood pressure was mildly reduced and she felt palpitations with heart rates in the 90s.  This lasted for 2 days.  She has not had any further episodes.  She is also concerned about her cholesterol.  She has been having issues with her statin causing her muscle pains.  This is being followed by her primary physician.  Review of systems complete and found to be negative unless listed in HPI.   Studies Reviewed:    EKG is ordered today. Personal review shows sinus rhythm    Risk Assessment/Calculations:    CHA2DS2-VASc Score = 3   This indicates a 3.2% annual risk of stroke. The patient's score is based upon: CHF History: 0 HTN History: 0 Diabetes History: 0 Stroke History: 0 Vascular Disease History: 1 Age Score: 1 Gender Score: 1            Physical Exam:   VS:  Pulse 71   Ht 5' 3.5" (1.613 m)   Wt 156 lb (70.8 kg)   LMP 03/31/2003   SpO2 97%   BMI 27.20 kg/m    Wt Readings from Last 3 Encounters:  03/30/23 156 lb (70.8 kg)  09/24/22 163 lb (73.9 kg)  06/17/22 162 lb 12.8 oz (73.8 kg)     GEN: Well nourished, well developed in no acute distress NECK: No JVD; No carotid bruits CARDIAC: Regular rate and rhythm, no murmurs, rubs, gallops RESPIRATORY:  Clear to auscultation without rales, wheezing or rhonchi  ABDOMEN: Soft, non-tender,  non-distended EXTREMITIES:  No edema; No deformity   ASSESSMENT AND PLAN:    1.  Persistent atrial fibrillation: Post ablation March 2022.  Currently on Eliquis.  CHA2DS2-VASc of 3. Remains in sinus rhythm, no complaints.  2.  Coronary artery disease: Calcifications noted on preablation CT.  Continue atorvastatin.  3.  Secondary hypercoagulable state: Currently on Eliquis for atrial fibrillation  4.  Hyperlipidemia: Currently on atorvastatin.  Being managed by her primary physician.  If cholesterol remains elevated, could refer to lipid clinic.  Follow up with EP APP in 6 months  Signed, Mileydi Milsap Jorja Loa, MD

## 2023-03-30 NOTE — Patient Instructions (Signed)
Medication Instructions:  Your physician recommends that you continue on your current medications as directed. Please refer to the Current Medication list given to you today.  *If you need a refill on your cardiac medications before your next appointment, please call your pharmacy*   Lab Work: None ordered   Testing/Procedures: None ordered   Follow-Up: At [facility], you and your health needs are our priority.  As part of our continuing mission to provide you with exceptional heart care, we have created designated Provider Care Teams.  These Care Teams include your primary Cardiologist (physician) and Advanced Practice Providers (APPs -  Physician Assistants and Nurse Practitioners) who all work together to provide you with the care you need, when you need it.  Your next appointment:   6 month(s)  The format for your next appointment:   In Person  Provider:   You will see one of the following Advanced Practice Providers on your designated Care Team:   Renee Ursuy, PA-C Michael "Andy" Tillery, PA-C   Thank you for choosing [facility]!!   Rayna Brenner, RN [phone]         

## 2023-03-31 ENCOUNTER — Other Ambulatory Visit: Payer: Self-pay | Admitting: Cardiology

## 2023-04-01 ENCOUNTER — Other Ambulatory Visit: Payer: Self-pay | Admitting: Cardiology

## 2023-04-04 NOTE — Telephone Encounter (Signed)
Prescription refill request for Eliquis received. Indication:afib Last office visit:6/24 Scr:0.78  4/24 Age: 72 Weight:70.8  kg  Prescription refilled

## 2023-04-07 ENCOUNTER — Ambulatory Visit (HOSPITAL_COMMUNITY)
Admission: RE | Admit: 2023-04-07 | Discharge: 2023-04-07 | Disposition: A | Discharge status: Home / Self Care | Payer: Medicare Other | Source: Ambulatory Visit | Attending: Rheumatology | Admitting: Rheumatology

## 2023-04-07 DIAGNOSIS — J849 Interstitial pulmonary disease, unspecified: Secondary | ICD-10-CM | POA: Diagnosis not present

## 2023-05-15 ENCOUNTER — Other Ambulatory Visit: Payer: Self-pay

## 2023-05-15 ENCOUNTER — Emergency Department
Admission: EM | Admit: 2023-05-15 | Discharge: 2023-05-15 | Discharge status: Against Medical Advice | Payer: Medicare Other | Attending: Emergency Medicine | Admitting: Emergency Medicine

## 2023-05-15 DIAGNOSIS — Z5321 Procedure and treatment not carried out due to patient leaving prior to being seen by health care provider: Secondary | ICD-10-CM | POA: Insufficient documentation

## 2023-05-15 DIAGNOSIS — R42 Dizziness and giddiness: Secondary | ICD-10-CM | POA: Insufficient documentation

## 2023-05-15 DIAGNOSIS — R1110 Vomiting, unspecified: Secondary | ICD-10-CM | POA: Insufficient documentation

## 2023-05-15 LAB — DIFFERENTIAL
Abs Immature Granulocytes: 0.04 10*3/uL (ref 0.00–0.07)
Basophils Absolute: 0 10*3/uL (ref 0.0–0.1)
Basophils Relative: 0 %
Eosinophils Absolute: 0.1 10*3/uL (ref 0.0–0.5)
Eosinophils Relative: 1 %
Immature Granulocytes: 1 %
Lymphocytes Relative: 37 %
Lymphs Abs: 2.9 10*3/uL (ref 0.7–4.0)
Monocytes Absolute: 0.7 10*3/uL (ref 0.1–1.0)
Monocytes Relative: 9 %
Neutro Abs: 4.2 10*3/uL (ref 1.7–7.7)
Neutrophils Relative %: 52 %

## 2023-05-15 LAB — COMPREHENSIVE METABOLIC PANEL
ALT: 12 U/L (ref 0–44)
AST: 23 U/L (ref 15–41)
Albumin: 4.3 g/dL (ref 3.5–5.0)
Alkaline Phosphatase: 63 U/L (ref 38–126)
Anion gap: 10 (ref 5–15)
BUN: 19 mg/dL (ref 8–23)
CO2: 23 mmol/L (ref 22–32)
Calcium: 9.6 mg/dL (ref 8.9–10.3)
Chloride: 104 mmol/L (ref 98–111)
Creatinine, Ser: 0.89 mg/dL (ref 0.44–1.00)
GFR, Estimated: >60 mL/min (ref >60)
Glucose, Bld: 134 mg/dL — ABNORMAL HIGH (ref 70–99)
Potassium: 3.3 mmol/L — ABNORMAL LOW (ref 3.5–5.1)
Sodium: 137 mmol/L (ref 135–145)
Total Bilirubin: 0.7 mg/dL (ref 0.3–1.2)
Total Protein: 8 g/dL (ref 6.5–8.1)

## 2023-05-15 LAB — CBC
HCT: 34.6 % — ABNORMAL LOW (ref 36.0–46.0)
Hemoglobin: 11.4 g/dL — ABNORMAL LOW (ref 12.0–15.0)
MCH: 31.8 pg (ref 26.0–34.0)
MCHC: 32.9 g/dL (ref 30.0–36.0)
MCV: 96.6 fL (ref 80.0–100.0)
Platelets: 242 10*3/uL (ref 150–400)
RBC: 3.58 MIL/uL — ABNORMAL LOW (ref 3.87–5.11)
RDW: 12.9 % (ref 11.5–15.5)
WBC: 7.8 10*3/uL (ref 4.0–10.5)
nRBC: 0 % (ref 0.0–0.2)

## 2023-05-15 MED ORDER — ONDANSETRON 4 MG PO TBDP
4.0000 mg | ORAL_TABLET | Freq: Once | ORAL | Status: AC
  Administered 2023-05-15: 4 mg via ORAL

## 2023-05-15 NOTE — ED Notes (Signed)
This RN and Tresa Endo, RN assisted the patient from the car per Spouses request. Pt endorses dizziness and vomiting that started this evening. Per Spouse, pt has a hx of Vertigo.

## 2023-05-15 NOTE — ED Triage Notes (Addendum)
Pt to ed from restaurant eating dinner and she became really dizzy and had some vomiting. Pt has HX of vertigo episodes but advised this was the case case. Pt is alert but cannot sit straight up  and states "the room is spinning". Pt is caox4 and in wheel chair in triage. Pt was prescribed meclizine previously but chose not to get it filled.

## 2023-05-15 NOTE — ED Notes (Addendum)
Pt refusing head CT and EKG due to "I can't lay back right now". This RN advised the pt of how important both of these tests were due to her sudden onset of dizziness but pt still refused.

## 2023-06-09 ENCOUNTER — Other Ambulatory Visit: Payer: Self-pay | Admitting: Cardiology

## 2023-06-29 NOTE — Progress Notes (Unsigned)
Primary Care Physician: Burton Apley, MD Cardiologist: Dr. Swaziland    Heather Koch is a 72 y.o. female with a  h/o rheumatoid arthritis on methotrexate. She was admitted  in July 2021 for acute respiratory infection with hypoxia. She had an episode of afib  during this hospitalization,short in duration and self converted. She was started on eliquis with a CHA2DS2VASc of 2, and cardizem for rate control.. She has a history of palpitations for years.  Event monitor did show afib burden of 18%, no sustained afib. CT of chest showed ILD. She was seen by Dr Delton Coombes  who noted significant improvement in CT findings.  She was seen in the Afib clinic in follow up and options for AAD therapy were discussed. Not a candidate for amiodarone due to ILD. Could consider Tikosyn or Multaq. Patient was Ok with current AFib burden and was managed with rate control and anticoagulation. Has coronary calcification noted on CT.   In 2022 she was having more Afib. She was seen by Dr Elberta Fortis and underwent Afib ablation on 12/19/20. Was seen back by him in June and doing well.  She is now being seen at [facility] by pulmonary and Rheumatology. Has ILD and RA. Was seen by Dr Elberta Fortis in June and doing well.   She did have labs at [facility] in April. Cholesterol was very high. She was not taking statin at that time and is now back on lipitor. States Dr Su Hilt did repeat labs and they were "better"  She has experienced a lot of vertigo recently.   Past Medical History:  Diagnosis Date   ASCUS (atypical squamous cells of undetermined significance) on Pap smear 12/2008   neg HR HPV   LGSIL (low grade squamous intraepithelial dysplasia) 2005/2006   Lichen sclerosus 2012   Pneumonia    Rheumatoid arthritis(714.0) 2012   Past Surgical History:  Procedure Laterality Date   ATRIAL FIBRILLATION ABLATION N/A 12/19/2020   Procedure: ATRIAL FIBRILLATION ABLATION;  Surgeon: Regan Lemming, MD;  Location: [facility];   Service: Cardiovascular;  Laterality: N/A;   TUBAL LIGATION  1997    Current Outpatient Medications  Medication Sig Dispense Refill   acetaminophen (TYLENOL) 500 MG tablet Take 1,000 mg by mouth 2 (two) times daily.     ALPRAZolam (XANAX) 1 MG tablet Take 0.5-1 mg by mouth at bedtime as needed for anxiety or sleep.     atorvastatin (LIPITOR) 20 MG tablet Take 20 mg by mouth. 2 times per week     Cholecalciferol (VITAMIN D) 50 MCG (2000 UT) tablet Take 2,000 Units by mouth daily.     clobetasol cream (TEMOVATE) 0.05 % APPLY TOPICALLY AT BEDTIME AS NEEDED FOR ITCHING 30 g 1   diclofenac sodium (VOLTAREN) 1 % GEL Apply 3 gm to 3 large joints up to 3 times a day.Dispense 3 tubes with 3 refills. 3 Tube 1   diltiazem (CARDIZEM CD) 240 MG 24 hr capsule TAKE 1 CAPSULE BY MOUTH DAILY 90 capsule 3   diltiazem (CARDIZEM) 30 MG tablet Take 1 tablet (30 mg total) by mouth every 6 (six) hours as needed (for elevated heart rates). 90 tablet 1   ELIQUIS 5 MG TABS tablet TAKE 1 TABLET BY MOUTH TWICE  DAILY 200 tablet 2   omeprazole (PRILOSEC) 40 MG capsule Take 40 mg by mouth daily.     Upadacitinib (RINVOQ PO) Take 1 tablet by mouth daily.     No current facility-administered medications for this visit.  Allergies  Allergen Reactions   Fish Allergy Other (See Comments)    Scallops  Reaction: Syncope   Shellfish-Derived Products     Other reaction(s): Unknown    Social History   Socioeconomic History   Marital status: Married    Spouse name: Not on file   Number of children: Not on file   Years of education: Not on file   Highest education level: Not on file  Occupational History   Not on file  Tobacco Use   Smoking status: Never   Smokeless tobacco: Never  Vaping Use   Vaping status: Never Used  Substance and Sexual Activity   Alcohol use: No   Drug use: Never   Sexual activity: Yes    Partners: Male    Birth control/protection: Post-menopausal    Comment: 1st intercourse 63 yo-1  partner  Other Topics Concern   Not on file  Social History Narrative   Not on file   Social Determinants of Health   Financial Resource Strain: Not on file  Food Insecurity: Not on file  Transportation Needs: Not on file  Physical Activity: Not on file  Stress: Not on file  Social Connections: Not on file  Intimate Partner Violence: Not on file    Family History  Problem Relation Age of Onset   Hypertension Mother    Diabetes Mother    Cancer Father        liver   Heart disease Sister    Aneurysm Sister    Cancer Brother        pancreatic cancer   Cancer Maternal Aunt        throat   Heart disease Sister    Heart disease Sister    Cancer Maternal Uncle        Colon   Heart disease Brother        pacemaker   Healthy Son    Healthy Son    Healthy Daughter     ROS- All systems are reviewed and negative except as per the HPI above  Physical Exam: There were no vitals filed for this visit. Wt Readings from Last 3 Encounters:  03/30/23 156 lb (70.8 kg)  09/24/22 163 lb (73.9 kg)  06/17/22 162 lb 12.8 oz (73.8 kg)    Labs: Lab Results  Component Value Date   NA 137 05/15/2023   K 3.3 (L) 05/15/2023   CL 104 05/15/2023   CO2 23 05/15/2023   GLUCOSE 134 (H) 05/15/2023   BUN 19 05/15/2023   CREATININE 0.89 05/15/2023   CALCIUM 9.6 05/15/2023   PHOS 4.0 04/23/2020   MG 2.3 04/23/2020   Lab Results  Component Value Date   INR 1.1 04/20/2020   Lab Results  Component Value Date   CHOL 191 08/06/2020   HDL 79 08/06/2020   LDLCALC 90 08/06/2020   TRIG 120 08/06/2020   LMP 03/31/2003   Dated 04/25/23: Hgb 11.4. potassium 3.3. creatinine and ALT normal. Plts normal.   GEN- The patient is well appearing, alert and oriented x 3 today.   Head- normocephalic, atraumatic Eyes-  Sclera clear, conjunctiva pink Ears- hearing intact Oropharynx- clear Neck- supple, no JVP Lymph- no cervical lymphadenopathy Lungs- mild crackles bilaterally,  normal work of  breathing Heart- Regular rate and rhythm, no murmurs, rubs or gallops, PMI not laterally displaced GI- soft, NT, ND, + BS Extremities- no clubbing, cyanosis, or edema MS- no significant deformity or atrophy Skin- no rash or lesion Psych- euthymic mood, full  affect Neuro- strength and sensation are intact   Echo7/5/21  1. Left ventricular ejection fraction, by estimation, is 50 to 55%. The left ventricle has low normal function. The left ventricle demonstrates global hypokinesis. Left ventricular diastolic parameters are indeterminate. 2. Right ventricular systolic function is normal. The right ventricular size is normal. Tricuspid regurgitation signal is inadequate for assessing PA pressure. 3. The mitral valve is grossly normal. Trivial mitral valve regurgitation. 4. The aortic valve is tricuspid. Aortic valve regurgitation is not visualized. 5. The inferior vena cava is normal in size with greater than 50% respiratory variability, suggesting right atrial pressure of 3 mmHg.  Event monitor 06/17/20: Study Highlights  Normal sinus rhythm Paroxysmal AFib with RVR- average HR in Afib 121 bpm. AFib burden of 13% Rare PVCs and PACs. Patient symptoms correlate with Afib   Sep 2022, monitor Predominant rhythm was sinus rhythm <1% ventricular and supraventricular ectopy 122 SVT episodes, longest 15 seconds 2 VT episodes, longest 12 beats No symptoms recorded    Assessment and Plan: 1. Paroxysmal afib-  Now s/p ablation on 12/19/20. Doing well. No documented recurrence.  On Eliquis for anticoagulation Given brief runs of SVT noted on monitor I would continue diltiazem  2. CHA2DS2VASc score of 2 Continue eliquis 5 mg bid   3. RA- per rheumatology. on Renvoq  4. ILD now followed by pulmonary at [facility]. Stable by CT June  6. Coronary calcification. Noted primarily in mid LAD. No angina. She is on statin therapy. Labs followed by PCP.   7. Vertigo. Per PCP  I will follow up in  one year    Estanislao Harmon Swaziland MD, [facility]

## 2023-06-30 ENCOUNTER — Encounter: Payer: Self-pay | Admitting: Cardiology

## 2023-06-30 ENCOUNTER — Ambulatory Visit: Payer: Medicare Other | Attending: Cardiology | Admitting: Cardiology

## 2023-06-30 VITALS — BP 144/70 | HR 75 | Ht 63.0 in | Wt 160.4 lb

## 2023-06-30 DIAGNOSIS — E785 Hyperlipidemia, unspecified: Secondary | ICD-10-CM | POA: Diagnosis not present

## 2023-06-30 DIAGNOSIS — I480 Paroxysmal atrial fibrillation: Secondary | ICD-10-CM

## 2023-06-30 DIAGNOSIS — I2510 Atherosclerotic heart disease of native coronary artery without angina pectoris: Secondary | ICD-10-CM

## 2023-06-30 NOTE — Patient Instructions (Signed)
Medication Instructions:  Continue same medications *If you need a refill on your cardiac medications before your next appointment, please call your pharmacy*   Lab Work: None ordered   Testing/Procedures: None ordered   Follow-Up: At [facility], you and your health needs are our priority.  As part of our continuing mission to provide you with exceptional heart care, we have created designated Provider Care Teams.  These Care Teams include your primary Cardiologist (physician) and Advanced Practice Providers (APPs -  Physician Assistants and Nurse Practitioners) who all work together to provide you with the care you need, when you need it.  We recommend signing up for the patient portal called "MyChart".  Sign up information is provided on this After Visit Summary.  MyChart is used to connect with patients for Virtual Visits (Telemedicine).  Patients are able to view lab/test results, encounter notes, upcoming appointments, etc.  Non-urgent messages can be sent to your provider as well.   To learn more about what you can do with MyChart, go to [email or web address].    Your next appointment:  1 year    Call in May to schedule Sept appointment    Provider:  Dr.Jordan

## 2023-07-22 ENCOUNTER — Ambulatory Visit: Payer: Medicare Other | Admitting: Cardiology

## 2023-09-20 ENCOUNTER — Other Ambulatory Visit: Payer: Self-pay | Admitting: Internal Medicine

## 2023-09-20 DIAGNOSIS — R1011 Right upper quadrant pain: Secondary | ICD-10-CM

## 2023-09-22 ENCOUNTER — Ambulatory Visit
Admission: RE | Admit: 2023-09-22 | Discharge: 2023-09-22 | Disposition: A | Discharge status: Home / Self Care | Payer: Medicare Other | Source: Ambulatory Visit | Attending: Internal Medicine | Admitting: Internal Medicine

## 2023-09-22 DIAGNOSIS — R1011 Right upper quadrant pain: Secondary | ICD-10-CM

## 2023-09-23 ENCOUNTER — Encounter: Payer: Self-pay | Admitting: Nurse Practitioner

## 2023-09-26 ENCOUNTER — Encounter: Payer: Self-pay | Admitting: Student

## 2023-09-26 ENCOUNTER — Ambulatory Visit: Payer: Medicare Other | Attending: Student | Admitting: Student

## 2023-09-26 VITALS — BP 129/77 | Ht 63.0 in | Wt 162.0 lb

## 2023-09-26 DIAGNOSIS — I480 Paroxysmal atrial fibrillation: Secondary | ICD-10-CM | POA: Diagnosis not present

## 2023-09-26 DIAGNOSIS — I2510 Atherosclerotic heart disease of native coronary artery without angina pectoris: Secondary | ICD-10-CM | POA: Diagnosis not present

## 2023-09-26 NOTE — Progress Notes (Signed)
  Electrophysiology Office Note:   Date:  09/26/2023  ID:  Heather Koch, DOB 04/13/1951, MRN [id number]  Primary Cardiologist: Peter Swaziland, MD Electrophysiologist: Regan Lemming, MD      History of Present Illness:   Heather Koch is a 72 y.o. female with h/o PAF s/p ablation and CAD on chest CT seen today for routine electrophysiology followup.   Since last being seen in our clinic the patient reports doing well from a cardiac perspective. Denies breakthrough AF. Sister also has AF and was previously started on Tikosyn. She is in her 33s and was told she was not an ablation candidate. Otherwise, the patient denies chest pain, palpitations, dyspnea, PND, orthopnea, nausea, vomiting, dizziness, syncope, edema, weight gain, or early satiety.   Review of systems complete and found to be negative unless listed in HPI.   EP Information / Studies Reviewed:    EKG is not ordered today. EKG from 03/30/2023 reviewed which showed NSR at 71 bpm       Arrhythmia History  S/p AF ablation 12/2020  Physical Exam:   VS:  BP 129/77 (BP Location: Left Arm, Patient Position: Sitting, Cuff Size: Normal)   Ht 5\' 3"  (1.6 m)   Wt 162 lb (73.5 kg)   LMP 03/31/2003   SpO2 97%   BMI 28.70 kg/m    Wt Readings from Last 3 Encounters:  09/26/23 162 lb (73.5 kg)  06/30/23 160 lb 6.4 oz (72.8 kg)  03/30/23 156 lb (70.8 kg)     GEN: Well nourished, well developed in no acute distress NECK: No JVD; No carotid bruits CARDIAC: Regular rate and rhythm, no murmurs, rubs, gallops RESPIRATORY:  Clear to auscultation without rales, wheezing or rhonchi  ABDOMEN: Soft, non-tender, non-distended EXTREMITIES:  No edema; No deformity   ASSESSMENT AND PLAN:    Persistent AF S/p ablation 12/2020 and mostly quiescent since then  Regular on and exam and no breakthrough symptoms.  Continue eliquis 5 mg BID for CHA2DS2/VASc of at least 3  CAD on chest CT No s/s ischemia Risk factor modifications   Follow up with  Dr. Elberta Fortis in 6 months  Signed, Graciella Freer, PA-C

## 2023-09-26 NOTE — Patient Instructions (Signed)
Medication Instructions:  Your physician recommends that you continue on your current medications as directed. Please refer to the Current Medication list given to you today.  *If you need a refill on your cardiac medications before your next appointment, please call your pharmacy*  Lab Work: None ordered If you have labs (blood work) drawn today and your tests are completely normal, you will receive your results only by: MyChart Message (if you have MyChart) OR A paper copy in the mail If you have any lab test that is abnormal or we need to change your treatment, we will call you to review the results.  Follow-Up: At [facility], you and your health needs are our priority.  As part of our continuing mission to provide you with exceptional heart care, we have created designated Provider Care Teams.  These Care Teams include your primary Cardiologist (physician) and Advanced Practice Providers (APPs -  Physician Assistants and Nurse Practitioners) who all work together to provide you with the care you need, when you need it.  Your next appointment:   6 month(s)  Provider:   Will Camnitz, MD  

## 2024-01-09 ENCOUNTER — Other Ambulatory Visit: Payer: Self-pay | Admitting: Cardiology

## 2024-01-10 NOTE — Telephone Encounter (Signed)
 Prescription refill request for Eliquis received. Indication: PAF Last office visit: 09/26/23  A Tillery PA-C Scr: 0.79 on 11/04/23  Epic Age: 73 Weight: 73.5kg  Based on above findings Eliquis 5mg  twice daily is the appropriate dose.  Refill approved.

## 2024-02-20 ENCOUNTER — Ambulatory Visit: Admitting: Nurse Practitioner

## 2024-03-25 NOTE — Progress Notes (Unsigned)
  Cardiology Office Note:  .   Date:  03/25/2024  ID:  Heather Koch, DOB Mar 07, 1951, MRN [id number] PCP: Dudley Ghee, MD  [facility] Providers Cardiologist:  Peter Swaziland, MD Electrophysiologist:  Lei Pump, MD {  History of Present Illness: .   Heather Koch is a 73 y.o. female w/PMHx of  RA, ILD AFib  Saw Dr. Swaziland 06/30/23, doing well, no changes were made  She saw Jonelle Neri 09/26/23, no symptoms of AFib, no changes were made  Today's visit is scheduled as a 6 mo visit ROS:   She is accompanied by her husband  She denies any CP, palpitations or cardiac awareness Infrequently she will have a day t hat perhaps feels like she has less energy she equates this to probably AFib, but these are far/few between Terrible knee pain and arthritis keeps her limited physically, unable to exercise regularly No near syncope or syncope. Gets brief nose bleeds a couple a month usually, not new, maybe more frequent  Her husband asks about getting her cholesterol checked, PMD typically manages it but in Feb of this year reports was high and started on new medicine (the current atorvastatin ) without a recheck yet  Arrhythmia/AAD hx AFib diagnosed July 2021 during a hospital stay with pneumonia PVI ablation 12/19/20  Studies Reviewed: Aaron Aas    EKG done today and reviewed by myself:  SR 68bpm, unchanged  Echo7/5/21  1. Left ventricular ejection fraction, by estimation, is 50 to 55%. The left ventricle has low normal function. The left ventricle demonstrates global hypokinesis. Left ventricular diastolic parameters are indeterminate. 2. Right ventricular systolic function is normal. The right ventricular size is normal. Tricuspid regurgitation signal is inadequate for assessing PA pressure. 3. The mitral valve is grossly normal. Trivial mitral valve regurgitation. 4. The aortic valve is tricuspid. Aortic valve regurgitation is not visualized. 5. The inferior vena cava is normal in  size with greater than 50% respiratory variability, suggesting right atrial pressure of 3 mmHg.    Event monitor 06/17/20: Study Highlights Normal sinus rhythm Paroxysmal AFib with RVR- average HR in Afib 121 bpm. AFib burden of 13% Rare PVCs and PACs. Patient symptoms correlate with Afib     Sep 2022, monitor Predominant rhythm was sinus rhythm <1% ventricular and supraventricular ectopy 122 SVT episodes, longest 15 seconds 2 VT episodes, longest 12 beats No symptoms recorded   Risk Assessment/Calculations:    Physical Exam:   VS:  LMP 03/31/2003    Wt Readings from Last 3 Encounters:  09/26/23 162 lb (73.5 kg)  06/30/23 160 lb 6.4 oz (72.8 kg)  03/30/23 156 lb (70.8 kg)    GEN: Well nourished, well developed in no acute distress NECK: No JVD; No carotid bruits CARDIAC: RRR, no murmurs, rubs, gallops RESPIRATORY:  CTA b/l without rales, wheezing or rhonchi  ABDOMEN: Soft, non-tender, non-distended EXTREMITIES: No edema; No deformity   ASSESSMENT AND PLAN: .    persistent AFib CHA2DS2Vasc is 3 (w aortic atherosclerosis noted as well on her CT), on Eliquis ,  appropriately dosed Minimal if any symptoms of AFib  Secondary hypercoagulable state 2/2 AFib  3. HLD Deferred to her PMD or Dr. Swaziland I will reach out to Dr. Christophe Cram RN to see if they/he can update her labs     Dispo: she can c/w Dr. Swaziland going forward, see EP team PRN, she is happy that plan  Signed, Debbie Fails, PA-C

## 2024-03-27 ENCOUNTER — Ambulatory Visit: Attending: Physician Assistant | Admitting: Physician Assistant

## 2024-03-27 VITALS — BP 126/62 | HR 68 | Ht 63.5 in | Wt 160.0 lb

## 2024-03-27 DIAGNOSIS — D6869 Other thrombophilia: Secondary | ICD-10-CM | POA: Diagnosis not present

## 2024-03-27 DIAGNOSIS — I4819 Other persistent atrial fibrillation: Secondary | ICD-10-CM

## 2024-03-27 NOTE — Patient Instructions (Signed)
 Medication Instructions:   Your physician recommends that you continue on your current medications as directed. Please refer to the Current Medication list given to you today.  *If you need a refill on your cardiac medications before your next appointment, please call your pharmacy*    Lab Work: NONE ORDERED  TODAY    If you have labs (blood work) drawn today and your tests are completely normal, you will receive your results only by: MyChart Message (if you have MyChart) OR A paper copy in the mail If you have any lab test that is abnormal or we need to change your treatment, we will call you to review the results.   Testing/Procedures: NONE ORDERED  TODAY      Follow-Up: At [facility], you and your health needs are our priority.  As part of our continuing mission to provide you with exceptional heart care, our providers are all part of one team.  This team includes your primary Cardiologist (physician) and Advanced Practice Providers or APPs (Physician Assistants and Nurse Practitioners) who all work together to provide you with the care you need, when you need it.   Your next appointment:  CONTACT [facility] [phone] AS NEEDED FOR  ANY CARDIAC RELATED SYMPTOMS   We recommend signing up for the patient portal called "MyChart".  Sign up information is provided on this After Visit Summary.  MyChart is used to connect with patients for Virtual Visits (Telemedicine).  Patients are able to view lab/test results, encounter notes, upcoming appointments, etc.  Non-urgent messages can be sent to your provider as well.   To learn more about what you can do with MyChart, go to [email or web address].   Other Instructions

## 2024-04-02 ENCOUNTER — Other Ambulatory Visit: Payer: Self-pay

## 2024-04-02 DIAGNOSIS — I2510 Atherosclerotic heart disease of native coronary artery without angina pectoris: Secondary | ICD-10-CM

## 2024-04-02 DIAGNOSIS — E785 Hyperlipidemia, unspecified: Secondary | ICD-10-CM

## 2024-04-03 LAB — LIPID PANEL

## 2024-04-04 ENCOUNTER — Ambulatory Visit: Payer: Self-pay | Admitting: Cardiology

## 2024-04-04 LAB — COMPREHENSIVE METABOLIC PANEL WITH GFR
ALT: 9 IU/L (ref 0–32)
AST: 20 IU/L (ref 0–40)
Albumin: 4.4 g/dL (ref 3.8–4.8)
Alkaline Phosphatase: 73 IU/L (ref 44–121)
BUN/Creatinine Ratio: 22 (ref 12–28)
BUN: 18 mg/dL (ref 8–27)
Bilirubin Total: 0.4 mg/dL (ref 0.0–1.2)
CO2: 22 mmol/L (ref 20–29)
Calcium: 10.1 mg/dL (ref 8.7–10.3)
Chloride: 103 mmol/L (ref 96–106)
Creatinine, Ser: 0.82 mg/dL (ref 0.57–1.00)
Globulin, Total: 2.8 g/dL (ref 1.5–4.5)
Glucose: 99 mg/dL (ref 70–99)
Potassium: 4.9 mmol/L (ref 3.5–5.2)
Sodium: 142 mmol/L (ref 134–144)
Total Protein: 7.2 g/dL (ref 6.0–8.5)
eGFR: 76 mL/min/{1.73_m2} (ref >59)

## 2024-04-04 LAB — LIPID PANEL
Cholesterol, Total: 204 mg/dL — ABNORMAL HIGH (ref 100–199)
HDL: 88 mg/dL (ref >39)
LDL CALC COMMENT:: 2.3 ratio (ref 0.0–4.4)
LDL Chol Calc (NIH): 102 mg/dL — ABNORMAL HIGH (ref 0–99)
Triglycerides: 79 mg/dL (ref 0–149)
VLDL Cholesterol Cal: 14 mg/dL (ref 5–40)

## 2024-04-11 ENCOUNTER — Ambulatory Visit: Admitting: Obstetrics and Gynecology

## 2024-04-11 ENCOUNTER — Encounter: Payer: Self-pay | Admitting: Obstetrics and Gynecology

## 2024-04-11 ENCOUNTER — Telehealth: Payer: Self-pay

## 2024-04-11 VITALS — BP 120/76 | HR 95 | Temp 97.7°F

## 2024-04-11 DIAGNOSIS — N3001 Acute cystitis with hematuria: Secondary | ICD-10-CM | POA: Diagnosis not present

## 2024-04-11 DIAGNOSIS — E2839 Other primary ovarian failure: Secondary | ICD-10-CM | POA: Diagnosis not present

## 2024-04-11 DIAGNOSIS — R3915 Urgency of urination: Secondary | ICD-10-CM | POA: Diagnosis not present

## 2024-04-11 LAB — URINALYSIS, COMPLETE W/RFL CULTURE
Glucose, UA: NEGATIVE
Protein, ur: NEGATIVE

## 2024-04-11 MED ORDER — FLUCONAZOLE 150 MG PO TABS: 150.0000 mg | ORAL_TABLET | Freq: Once | ORAL | 0 refills | Status: AC

## 2024-04-11 MED ORDER — NITROFURANTOIN MONOHYD MACRO 100 MG PO CAPS: 100.0000 mg | ORAL_CAPSULE | Freq: Two times a day (BID) | ORAL | 0 refills | Status: AC

## 2024-04-11 MED ORDER — IMVEXXY STARTER PACK 10 MCG VA INST: 1.0000 | VAGINAL_INSERT | VAGINAL | 12 refills | Status: DC

## 2024-04-11 NOTE — Telephone Encounter (Signed)
 PA needed for Imvexxy  Please provide dx & if patient has tried & failed other medications. If so please list their names.    KEY: [id number]

## 2024-04-11 NOTE — Progress Notes (Signed)
 73 y.o. y.o. female here for frequency and resistant voids when she voids since yesterday No fevers or upper back pain Patient's last menstrual period was 03/31/2003.   Colon 2017,MMG 08-27-21,DEXA 08-17-19, 07/20/24 at [facility]- to get records ,Pap 12-21-16 [facility] Medicare low risk Colo: 2025 Dr. Rollin [facility] 12/24  To begin vaginal estrogen with imvexxy. Sent to myscript 3 times a week Annual with Tiffany Does not feel any vaginal bulge   There is no height or weight on file to calculate BMI.      No data to display          Blood pressure 120/76, pulse 95, temperature 97.7 F (36.5 C), temperature source Oral, last menstrual period 03/31/2003, SpO2 97%.  No results found for: DIAGPAP, HPVHIGH, ADEQPAP  GYN HISTORY: No results found for: DIAGPAP, HPVHIGH, ADEQPAP  OB History  Gravida Para Term Preterm AB Living  3 3    3   SAB IAB Ectopic Multiple Live Births          # Outcome Date GA Lbr Len/2nd Weight Sex Type Anes PTL Lv  3 Para           2 Para           1 Para             Past Medical History:  Diagnosis Date   ASCUS (atypical squamous cells of undetermined significance) on Pap smear 12/2008   neg HR HPV   LGSIL (low grade squamous intraepithelial dysplasia) 2005/2006   Lichen sclerosus 2012   Pneumonia    Rheumatoid arthritis(714.0) 2012    Past Surgical History:  Procedure Laterality Date   ATRIAL FIBRILLATION ABLATION N/A 12/19/2020   Procedure: ATRIAL FIBRILLATION ABLATION;  Surgeon: Inocencio Soyla Lunger, MD;  Location: [facility];  Service: Cardiovascular;  Laterality: N/A;   TUBAL LIGATION  1997    Current Outpatient Medications on File Prior to Visit  Medication Sig Dispense Refill   acetaminophen  (TYLENOL ) 500 MG tablet Take 1,000 mg by mouth 2 (two) times daily.     ALPRAZolam  (XANAX ) 1 MG tablet Take 0.5-1 mg by mouth at bedtime as needed for anxiety or sleep.     apixaban  (ELIQUIS ) 5 MG TABS tablet TAKE 1 TABLET BY MOUTH  TWICE  DAILY 200 tablet 1   atorvastatin  (LIPITOR) 20 MG tablet Take 20 mg by mouth at bedtime.     Cholecalciferol (VITAMIN D ) 50 MCG (2000 UT) tablet Take 2,000 Units by mouth daily.     clobetasol  cream (TEMOVATE ) 0.05 % APPLY TOPICALLY AT BEDTIME AS NEEDED FOR ITCHING 30 g 1   diclofenac  sodium (VOLTAREN ) 1 % GEL Apply 3 gm to 3 large joints up to 3 times a day.Dispense 3 tubes with 3 refills. 3 Tube 1   diltiazem  (CARDIZEM  CD) 240 MG 24 hr capsule TAKE 1 CAPSULE BY MOUTH DAILY 90 capsule 3   omeprazole (PRILOSEC) 40 MG capsule Take 40 mg by mouth every other day.     Upadacitinib (RINVOQ PO) Take 1 tablet by mouth daily.     valACYclovir (VALTREX) 1000 MG tablet Take 1,000 mg by mouth as needed.     No current facility-administered medications on file prior to visit.    Social History   Socioeconomic History   Marital status: Married    Spouse name: Not on file   Number of children: Not on file   Years of education: Not on file   Highest education level: Not on  file  Occupational History   Not on file  Tobacco Use   Smoking status: Never   Smokeless tobacco: Never  Vaping Use   Vaping status: Never Used  Substance and Sexual Activity   Alcohol use: No   Drug use: Never   Sexual activity: Yes    Partners: Male    Birth control/protection: Post-menopausal    Comment: 1st intercourse 75 yo-1 partner  Other Topics Concern   Not on file  Social History Narrative   Not on file   Social Drivers of Health   Financial Resource Strain: Not on file  Food Insecurity: Low Risk  (04/05/2024)   Received from [facility]   Hunger Vital Sign    Within the past 12 months, you worried that your food would run out before you got money to buy more: Never true    Within the past 12 months, the food you bought just didn't last and you didn't have money to get more. : Never true  Transportation Needs: No Transportation Needs (04/05/2024)   Received from [facility]     In the past 12 months, has lack of reliable transportation kept you from medical appointments, meetings, work or from getting things needed for daily living? : No  Physical Activity: Not on file  Stress: Not on file  Social Connections: Not on file  Intimate Partner Violence: Not on file    Family History  Problem Relation Age of Onset   Hypertension Mother    Diabetes Mother    Cancer Father        liver   Heart disease Sister    Aneurysm Sister    Cancer Brother        pancreatic cancer   Cancer Maternal Aunt        throat   Heart disease Sister    Heart disease Sister    Cancer Maternal Uncle        Colon   Heart disease Brother        pacemaker   Healthy Son    Healthy Son    Healthy Daughter      Allergies  Allergen Reactions   Fish Allergy  Other (See Comments)    Scallops  Reaction: Syncope   Shellfish-Derived Products     Other reaction(s): Unknown      Patient's last menstrual period was Patient's last menstrual period was 03/31/2003.SABRA             Review of Systems Alls systems reviewed and are negative.     OBGyn Exam    A:        UTI                             P:          To begin macrobid twice daily for 7 days. Reviewed allergies. Diflucan  to prevent a yeast infection after completing the macrobid To get records from [facility] on bone scan and colonoscopy from Dr. Rollin RTC with persistent or worsening s/s Annual exam with Tiffany  Dr. Glennon  No follow-ups on file.  Almarie MARLA Glennon

## 2024-04-12 ENCOUNTER — Telehealth: Payer: Self-pay | Admitting: Obstetrics and Gynecology

## 2024-04-12 ENCOUNTER — Encounter: Payer: Self-pay | Admitting: Obstetrics and Gynecology

## 2024-04-12 ENCOUNTER — Ambulatory Visit: Payer: Self-pay | Admitting: Obstetrics and Gynecology

## 2024-04-12 DIAGNOSIS — M810 Age-related osteoporosis without current pathological fracture: Secondary | ICD-10-CM

## 2024-04-12 MED ORDER — ROMOSOZUMAB-AQQG 105 MG/1.17ML ~~LOC~~ SOSY
210.0000 mg | PREFILLED_SYRINGE | Freq: Once | SUBCUTANEOUS | Status: AC
Start: 2024-04-26 — End: ?

## 2024-04-12 NOTE — Telephone Encounter (Signed)
 Spoke with patient. [facility] dxa scan seen in results. Reviewed right hip with osteoporosis And concern for risk of fracture high frax and mortality risks with  a hip fracture. Discussed reviewed her history. No MI or stroke in last year Risk of Osteonecrosis of the jaw .05% vs. Risk of hip fracture with frax score in hip 5.7% in ten years. Frax hip cut off 3%. She is willing to go on Evenity/prolia and will take vit D and calcium   PA sent to Damien Meigs to see her an her husband at any time with questions Dr. Glennon

## 2024-04-12 NOTE — Telephone Encounter (Signed)
 Osteoporosis needs bone support medication. See last note patient agreeable to Evenity/Prolia Dr. Glennon

## 2024-04-13 ENCOUNTER — Telehealth: Payer: Self-pay

## 2024-04-13 LAB — URINALYSIS, COMPLETE W/RFL CULTURE
Bilirubin Urine: NEGATIVE
Hgb urine dipstick: NEGATIVE
Hyaline Cast: NONE SEEN /LPF
Ketones, ur: NEGATIVE
Nitrites, Initial: NEGATIVE
RBC / HPF: NONE SEEN /HPF (ref 0–2)
Specific Gravity, Urine: 1.005 (ref 1.001–1.035)
pH: 6 (ref 5.0–8.0)

## 2024-04-13 LAB — URINE CULTURE
MICRO NUMBER:: 16623651
SPECIMEN QUALITY:: ADEQUATE

## 2024-04-13 LAB — CULTURE INDICATED

## 2024-04-13 NOTE — Telephone Encounter (Signed)
 Patients last bmd was 2023 & it is in epic. I called Dr. Ardelle office to get her colonoscopy report & they said patient needs to sign a records release. Since you are not listed as one of her providers. I called the patient & she said she will go to their office to sign one.

## 2024-04-13 NOTE — Telephone Encounter (Signed)
-----   Message from Almarie MARLA Carpen sent at 04/11/2024 12:35 PM EDT ----- Can we get her dxa results from [facility]  And colonoscopy report from Dr. Rollin   Thank you Dr. Carpen

## 2024-04-15 ENCOUNTER — Ambulatory Visit: Payer: Self-pay | Admitting: Obstetrics and Gynecology

## 2024-04-17 ENCOUNTER — Ambulatory Visit: Payer: Self-pay | Admitting: Obstetrics and Gynecology

## 2024-04-18 ENCOUNTER — Telehealth: Payer: Self-pay | Admitting: *Deleted

## 2024-04-18 NOTE — Telephone Encounter (Signed)
 Call to patient. Patient advised with [facility] medicare, patient responsibility is 20% for Evenity  which is estimated at $490/month for each injection. Patient states she would like to discuss cost with husband and discuss medication with Dr. KANDICE, Rheumatologist, before deciding if she would like to proceed. Patient states she has her Dexa scheduled for 04/23/24 at [facility] and would also like to make sure Tiffany, NP will be the one receiving the results and making recommendations as she is her provider at [facility]. RN advised would send to Tiffany, NP so she is aware. Patient agreeable.   Routing to provider for review.

## 2024-04-18 NOTE — Telephone Encounter (Signed)
-----   Message from Heather Koch sent at 04/12/2024 12:39 PM EDT ----- -2.7 severe osteoporosis With high risk frax score  Has RA No fractures Has reflux  Not smoker No MI or stroke in the last year Counseled on r/b/a/I of the medications.  Needs Evenity jimmy  Can we expedite the PA  Dr. Carpen

## 2024-04-18 NOTE — Telephone Encounter (Signed)
 Thank you :)

## 2024-04-18 NOTE — Telephone Encounter (Signed)
 Called [facility] and added [place], NP along with Dr. Gustavus, patient's Rheumatologist, as receiving provider of Dexa results.   Routing to provider as RICK.

## 2024-04-19 ENCOUNTER — Other Ambulatory Visit: Payer: Self-pay | Admitting: Obstetrics and Gynecology

## 2024-04-19 DIAGNOSIS — E2839 Other primary ovarian failure: Secondary | ICD-10-CM

## 2024-04-19 NOTE — Telephone Encounter (Signed)
 Med refill request: Imvexxy  10 mcg  Pharmacy requests change from Imvexxy  10 mcg starter pack to Imvexxy  10 mcg Maintenance pack.  RX was written for #36 with 12 refills. Directions:  use three times per week. Pharmacy is requesting #8 with refills.  Last OV: 04/11/24 Dr. Glennon Last AEX: 03/01/22 Tiffany NP Next AEX: 05/29/24 Last MMG (if hormonal med) 09/21/23 BI-RADS 1 negative Refill authorized: Please Advise?

## 2024-04-20 ENCOUNTER — Other Ambulatory Visit: Payer: Self-pay | Admitting: Cardiology

## 2024-04-23 LAB — HM DEXA SCAN

## 2024-04-24 NOTE — Telephone Encounter (Signed)
 See telephone encounter.

## 2024-04-25 ENCOUNTER — Ambulatory Visit: Payer: Self-pay | Admitting: Nurse Practitioner

## 2024-04-25 ENCOUNTER — Encounter: Payer: Self-pay | Admitting: Nurse Practitioner

## 2024-05-29 ENCOUNTER — Encounter: Admitting: Nurse Practitioner

## 2024-06-07 ENCOUNTER — Telehealth: Payer: Self-pay | Admitting: *Deleted

## 2024-06-07 NOTE — Telephone Encounter (Signed)
 Yes, she wants her rheumatologist to continue managing her bone health. We had discussed both Evenity  and Prolia as good options and she plans to discuss with them.

## 2024-06-07 NOTE — Telephone Encounter (Signed)
 BMD order cancelled.   Encounter closed.

## 2024-06-07 NOTE — Telephone Encounter (Signed)
 Per review of EPIC, BMD completed on 04/23/24, results sent to Tiffany.   Please cancel duplicate order.   Cc: Tiffany

## 2024-06-07 NOTE — Telephone Encounter (Signed)
 Glennon Almarie POUR, MD  P [facility] Triage Can we see if she wanted to do the bone scan.  Showing that it was not done Dr. Glennon

## 2024-06-24 ENCOUNTER — Other Ambulatory Visit: Payer: Self-pay | Admitting: Cardiology

## 2024-07-02 NOTE — Progress Notes (Unsigned)
 Primary Care Physician: Henry Ingle, MD Cardiologist: Dr. [place]    Heather Koch is a 73 y.o. female with a  h/o rheumatoid arthritis on methotrexate . She was admitted  in July 2021 for acute respiratory infection with hypoxia. She had an episode of afib  during this hospitalization,short in duration and self converted. She was started on eliquis  with a CHA2DS2VASc of 2, and cardizem  for rate control.. She has a history of palpitations for years.  Event monitor did show afib burden of 18%, no sustained afib. CT of chest showed ILD. She was seen by Dr Shelah  who noted significant improvement in CT findings.  She was seen in the Afib clinic in follow up and options for AAD therapy were discussed. Not a candidate for amiodarone due to ILD. Could consider Tikosyn or Multaq. Patient was Ok with current AFib burden and was managed with rate control and anticoagulation. Has coronary calcification noted on CT.   In 2022 she was having more Afib. She was seen by Dr Inocencio and underwent Afib ablation on 12/19/20. Was seen back by him in June and doing well.  She is now being seen at [facility] by pulmonary and Rheumatology. Has ILD and RA. Was seen by Dr Inocencio in June and doing well.   She did have labs at [facility] in April. Cholesterol was very high. She was not taking statin at that time and is now back on lipitor. States Dr Henry did repeat labs and they were better  She has experienced a lot of vertigo recently.   Past Medical History:  Diagnosis Date   ASCUS (atypical squamous cells of undetermined significance) on Pap smear 12/2008   neg HR HPV   LGSIL (low grade squamous intraepithelial dysplasia) 2005/2006   Lichen sclerosus 2012   Pneumonia    Rheumatoid arthritis(714.0) 2012   Past Surgical History:  Procedure Laterality Date   ATRIAL FIBRILLATION ABLATION N/A 12/19/2020   Procedure: ATRIAL FIBRILLATION ABLATION;  Surgeon: Inocencio Soyla Lunger, MD;  Location: [facility] INVASIVE CV LAB;   Service: Cardiovascular;  Laterality: N/A;   TUBAL LIGATION  1997    Current Outpatient Medications  Medication Sig Dispense Refill   acetaminophen  (TYLENOL ) 500 MG tablet Take 1,000 mg by mouth 2 (two) times daily.     ALPRAZolam  (XANAX ) 1 MG tablet Take 0.5-1 mg by mouth at bedtime as needed for anxiety or sleep.     apixaban  (ELIQUIS ) 5 MG TABS tablet TAKE 1 TABLET BY MOUTH TWICE  DAILY 200 tablet 1   atorvastatin  (LIPITOR) 20 MG tablet Take 20 mg by mouth at bedtime.     Cholecalciferol (VITAMIN D ) 50 MCG (2000 UT) tablet Take 2,000 Units by mouth daily.     clobetasol  cream (TEMOVATE ) 0.05 % APPLY TOPICALLY AT BEDTIME AS NEEDED FOR ITCHING 30 g 1   diclofenac  sodium (VOLTAREN ) 1 % GEL Apply 3 gm to 3 large joints up to 3 times a day.Dispense 3 tubes with 3 refills. 3 Tube 1   diltiazem  (CARTIA  XT) 240 MG 24 hr capsule TAKE 1 CAPSULE BY MOUTH DAILY 100 capsule 2   Estradiol  (IMVEXXY  MAINTENANCE PACK) 10 MCG INST Place 1 capsule vaginally 3 (three) times a week. 8 each 5   nitrofurantoin , macrocrystal-monohydrate, (MACROBID ) 100 MG capsule Take 1 capsule (100 mg total) by mouth 2 (two) times daily. 14 capsule 0   omeprazole (PRILOSEC) 40 MG capsule Take 40 mg by mouth every other day.     Upadacitinib (RINVOQ PO)  Take 1 tablet by mouth daily.     valACYclovir (VALTREX) 1000 MG tablet Take 1,000 mg by mouth as needed.     Current Facility-Administered Medications  Medication Dose Route Frequency Provider Last Rate Last Admin   Romosozumab -aqqg (EVENITY ) 105 MG/1. injection 210 mg  210 mg Subcutaneous Once         Allergies  Allergen Reactions   Fish Allergy  Other (See Comments)    Scallops  Reaction: Syncope   Shellfish-Derived Products     Other reaction(s): Unknown    Social History   Socioeconomic History   Marital status: Married    Spouse name: Not on file   Number of children: Not on file   Years of education: Not on file   Highest education level: Not on file   Occupational History   Not on file  Tobacco Use   Smoking status: Never   Smokeless tobacco: Never  Vaping Use   Vaping status: Never Used  Substance and Sexual Activity   Alcohol use: No   Drug use: Never   Sexual activity: Yes    Partners: Male    Birth control/protection: Post-menopausal    Comment: 1st intercourse 6 yo-1 partner  Other Topics Concern   Not on file  Social History Narrative   Not on file   Social Drivers of Health   Financial Resource Strain: Not on file  Food Insecurity: Low Risk  (04/05/2024)   Received from [facility]   Hunger Vital Sign    Within the past 12 months, you worried that your food would run out before you got money to buy more: Never true    Within the past 12 months, the food you bought just didn't last and you didn't have money to get more. : Never true  Transportation Needs: No Transportation Needs (04/05/2024)   Received from [facility]    In the past 12 months, has lack of reliable transportation kept you from medical appointments, meetings, work or from getting things needed for daily living? : No  Physical Activity: Not on file  Stress: Not on file  Social Connections: Not on file  Intimate Partner Violence: Not on file    Family History  Problem Relation Age of Onset   Hypertension Mother    Diabetes Mother    Cancer Father        liver   Heart disease Sister    Aneurysm Sister    Cancer Brother        pancreatic cancer   Cancer Maternal Aunt        throat   Heart disease Sister    Heart disease Sister    Cancer Maternal Uncle        Colon   Heart disease Brother        pacemaker   Healthy Son    Healthy Son    Healthy Daughter     ROS- All systems are reviewed and negative except as per the HPI above  Physical Exam: There were no vitals filed for this visit. Wt Readings from Last 3 Encounters:  03/27/24 160 lb (72.6 kg)  09/26/23 162 lb (73.5 kg)  06/30/23 160 lb 6.4 oz (72.8 kg)     Labs: Lab Results  Component Value Date   NA 142 04/03/2024   K 4.9 04/03/2024   CL 103 04/03/2024   CO2 22 04/03/2024   GLUCOSE 99 04/03/2024   BUN 18 04/03/2024   CREATININE 0.82 04/03/2024  CALCIUM  10.1 04/03/2024   PHOS 4.0 04/23/2020   MG 2.3 04/23/2020   Lab Results  Component Value Date   INR 1.1 04/20/2020   Lab Results  Component Value Date   CHOL 204 (H) 04/03/2024   HDL 88 04/03/2024   LDLCALC 102 (H) 04/03/2024   TRIG 79 04/03/2024   LMP 03/31/2003   Dated 04/25/23: Hgb 11.4. potassium 3.3. creatinine and ALT normal. Plts normal.   GEN- The patient is well appearing, alert and oriented x 3 today.   Head- normocephalic, atraumatic Eyes-  Sclera clear, conjunctiva pink Ears- hearing intact Oropharynx- clear Neck- supple, no JVP Lymph- no cervical lymphadenopathy Lungs- mild crackles bilaterally,  normal work of breathing Heart- Regular rate and rhythm, no murmurs, rubs or gallops, PMI not laterally displaced GI- soft, NT, ND, + BS Extremities- no clubbing, cyanosis, or edema MS- no significant deformity or atrophy Skin- no rash or lesion Psych- euthymic mood, full affect Neuro- strength and sensation are intact   Echo7/5/21  1. Left ventricular ejection fraction, by estimation, is 50 to 55%. The left ventricle has low normal function. The left ventricle demonstrates global hypokinesis. Left ventricular diastolic parameters are indeterminate. 2. Right ventricular systolic function is normal. The right ventricular size is normal. Tricuspid regurgitation signal is inadequate for assessing PA pressure. 3. The mitral valve is grossly normal. Trivial mitral valve regurgitation. 4. The aortic valve is tricuspid. Aortic valve regurgitation is not visualized. 5. The inferior vena cava is normal in size with greater than 50% respiratory variability, suggesting right atrial pressure of 3 mmHg.  Event monitor 06/17/20: Study Highlights  Normal sinus  rhythm Paroxysmal AFib with RVR- average HR in Afib 121 bpm. AFib burden of 13% Rare PVCs and PACs. Patient symptoms correlate with Afib   Sep 2022, monitor Predominant rhythm was sinus rhythm <1% ventricular and supraventricular ectopy 122 SVT episodes, longest 15 seconds 2 VT episodes, longest 12 beats No symptoms recorded    Assessment and Plan: 1. Paroxysmal afib-  Now s/p ablation on 12/19/20. Doing well. No documented recurrence.  On Eliquis  for anticoagulation Given brief runs of SVT noted on monitor I would continue diltiazem   2. CHA2DS2VASc score of 2 Continue eliquis  5 mg bid   3. RA- per rheumatology. on Renvoq  4. ILD now followed by pulmonary at [facility]. Stable by CT June  6. Coronary calcification. Noted primarily in mid LAD. No angina. She is on statin therapy. Labs followed by PCP.   7. Vertigo. Per PCP  I will follow up in one year    Anneta Rounds [place] MD, FACC

## 2024-07-03 ENCOUNTER — Encounter: Payer: Self-pay | Admitting: Cardiology

## 2024-07-03 ENCOUNTER — Ambulatory Visit: Attending: Cardiology | Admitting: Cardiology

## 2024-07-03 VITALS — BP 131/67 | HR 87 | Ht 63.39 in | Wt 152.0 lb

## 2024-07-03 DIAGNOSIS — J849 Interstitial pulmonary disease, unspecified: Secondary | ICD-10-CM

## 2024-07-03 DIAGNOSIS — I4819 Other persistent atrial fibrillation: Secondary | ICD-10-CM | POA: Diagnosis not present

## 2024-07-03 DIAGNOSIS — M0579 Rheumatoid arthritis with rheumatoid factor of multiple sites without organ or systems involvement: Secondary | ICD-10-CM

## 2024-07-03 NOTE — Patient Instructions (Signed)
 Medication Instructions:  Continue same medications *If you need a refill on your cardiac medications before your next appointment, please call your pharmacy*  Lab Work: None ordered  Testing/Procedures: None ordered  Follow-Up: At [facility], you and your health needs are our priority.  As part of our continuing mission to provide you with exceptional heart care, our providers are all part of one team.  This team includes your primary Cardiologist (physician) and Advanced Practice Providers or APPs (Physician Assistants and Nurse Practitioners) who all work together to provide you with the care you need, when you need it.  Your next appointment:  1 year   Call in May to schedule Sept appointment      Provider:  Dr.Jordan   We recommend signing up for the patient portal called MyChart.  Sign up information is provided on this After Visit Summary.  MyChart is used to connect with patients for Virtual Visits (Telemedicine).  Patients are able to view lab/test results, encounter notes, upcoming appointments, etc.  Non-urgent messages can be sent to your provider as well.   To learn more about what you can do with MyChart, go to [email or web address].

## 2024-09-28 LAB — HM MAMMOGRAPHY

## 2024-10-23 ENCOUNTER — Other Ambulatory Visit: Payer: Self-pay

## 2024-10-24 MED ORDER — DILTIAZEM HCL ER COATED BEADS 240 MG PO CP24: 240.0000 mg | ORAL_CAPSULE | Freq: Every day | ORAL | 2 refills | Status: AC

## 2024-10-24 MED ORDER — APIXABAN 5 MG PO TABS: 5.0000 mg | ORAL_TABLET | Freq: Two times a day (BID) | ORAL | 1 refills | Status: AC
